# Patient Record
Sex: Female | Born: 1935 | Race: White | Hispanic: No | Smoking: Former smoker
Health system: Southern US, Community
[De-identification: ages and names within clinical notes are randomized; demographics above are authoritative.]

## PROBLEM LIST (undated history)

## (undated) DIAGNOSIS — T510X1A Toxic effect of ethanol, accidental (unintentional), initial encounter: Secondary | ICD-10-CM

## (undated) DIAGNOSIS — I4891 Unspecified atrial fibrillation: Secondary | ICD-10-CM

## (undated) DIAGNOSIS — I428 Other cardiomyopathies: Secondary | ICD-10-CM

## (undated) DIAGNOSIS — H9190 Unspecified hearing loss, unspecified ear: Secondary | ICD-10-CM

## (undated) DIAGNOSIS — E785 Hyperlipidemia, unspecified: Secondary | ICD-10-CM

## (undated) DIAGNOSIS — F1020 Alcohol dependence, uncomplicated: Secondary | ICD-10-CM

## (undated) DIAGNOSIS — I501 Left ventricular failure: Secondary | ICD-10-CM

## (undated) DIAGNOSIS — I10 Essential (primary) hypertension: Secondary | ICD-10-CM

## (undated) DIAGNOSIS — I639 Cerebral infarction, unspecified: Secondary | ICD-10-CM

## (undated) HISTORY — DX: Essential (primary) hypertension: I10

## (undated) HISTORY — PX: ANKLE SURGERY: SHX546

## (undated) HISTORY — DX: Hyperlipidemia, unspecified: E78.5

## (undated) HISTORY — DX: Unspecified atrial fibrillation: I48.91

## (undated) HISTORY — PX: TONSILLECTOMY: SUR1361

## (undated) HISTORY — DX: Cerebral infarction, unspecified: I63.9

## (undated) HISTORY — DX: Other cardiomyopathies: I42.8

## (undated) HISTORY — DX: Toxic effect of ethanol, accidental (unintentional), initial encounter: T51.0X1A

## (undated) HISTORY — DX: Unspecified hearing loss, unspecified ear: H91.90

## (undated) HISTORY — DX: Left ventricular failure, unspecified: I50.1

---
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
## 1997-07-18 ENCOUNTER — Ambulatory Visit (HOSPITAL_COMMUNITY): Admission: RE | Admit: 1997-07-18 | Discharge: 1997-07-18 | Payer: Self-pay | Admitting: Obstetrics and Gynecology

## 1997-08-04 ENCOUNTER — Ambulatory Visit (HOSPITAL_COMMUNITY): Admission: RE | Admit: 1997-08-04 | Discharge: 1997-08-04 | Payer: Self-pay | Admitting: Obstetrics and Gynecology

## 1998-02-13 ENCOUNTER — Other Ambulatory Visit: Admission: RE | Admit: 1998-02-13 | Discharge: 1998-02-13 | Payer: Self-pay | Admitting: Obstetrics and Gynecology

## 1998-10-23 ENCOUNTER — Encounter: Payer: Self-pay | Admitting: Emergency Medicine

## 1998-10-23 ENCOUNTER — Emergency Department (HOSPITAL_COMMUNITY): Admission: EM | Admit: 1998-10-23 | Discharge: 1998-10-23 | Payer: Self-pay | Admitting: Emergency Medicine

## 1998-12-13 ENCOUNTER — Encounter: Admission: RE | Admit: 1998-12-13 | Discharge: 1998-12-25 | Payer: Self-pay | Admitting: Orthopedic Surgery

## 1998-12-26 ENCOUNTER — Encounter: Admission: RE | Admit: 1998-12-26 | Discharge: 1999-01-18 | Payer: Self-pay | Admitting: Orthopedic Surgery

## 1999-04-12 ENCOUNTER — Other Ambulatory Visit: Admission: RE | Admit: 1999-04-12 | Discharge: 1999-04-12 | Payer: Self-pay | Admitting: Obstetrics and Gynecology

## 2000-04-30 ENCOUNTER — Other Ambulatory Visit: Admission: RE | Admit: 2000-04-30 | Discharge: 2000-04-30 | Payer: Self-pay | Admitting: Obstetrics and Gynecology

## 2000-06-01 ENCOUNTER — Encounter: Payer: Self-pay | Admitting: Emergency Medicine

## 2000-06-01 ENCOUNTER — Inpatient Hospital Stay (HOSPITAL_COMMUNITY): Admission: EM | Admit: 2000-06-01 | Discharge: 2000-06-04 | Payer: Self-pay | Admitting: Emergency Medicine

## 2000-06-01 ENCOUNTER — Encounter: Payer: Self-pay | Admitting: Specialist

## 2000-08-11 ENCOUNTER — Encounter: Admission: RE | Admit: 2000-08-11 | Discharge: 2000-09-12 | Payer: Self-pay | Admitting: Specialist

## 2001-05-05 ENCOUNTER — Other Ambulatory Visit: Admission: RE | Admit: 2001-05-05 | Discharge: 2001-05-05 | Payer: Self-pay | Admitting: Obstetrics and Gynecology

## 2004-08-08 ENCOUNTER — Other Ambulatory Visit: Admission: RE | Admit: 2004-08-08 | Discharge: 2004-08-08 | Payer: Self-pay | Admitting: *Deleted

## 2004-12-06 ENCOUNTER — Encounter: Admission: RE | Admit: 2004-12-06 | Discharge: 2004-12-06 | Payer: Self-pay | Admitting: Endocrinology

## 2011-06-03 DIAGNOSIS — H3530 Unspecified macular degeneration: Secondary | ICD-10-CM | POA: Diagnosis not present

## 2011-06-03 DIAGNOSIS — H35369 Drusen (degenerative) of macula, unspecified eye: Secondary | ICD-10-CM | POA: Diagnosis not present

## 2011-07-10 DIAGNOSIS — E785 Hyperlipidemia, unspecified: Secondary | ICD-10-CM | POA: Diagnosis not present

## 2011-07-10 DIAGNOSIS — I10 Essential (primary) hypertension: Secondary | ICD-10-CM | POA: Diagnosis not present

## 2011-07-10 DIAGNOSIS — I501 Left ventricular failure: Secondary | ICD-10-CM | POA: Diagnosis not present

## 2011-08-13 DIAGNOSIS — E785 Hyperlipidemia, unspecified: Secondary | ICD-10-CM | POA: Diagnosis not present

## 2011-08-13 DIAGNOSIS — E559 Vitamin D deficiency, unspecified: Secondary | ICD-10-CM | POA: Diagnosis not present

## 2011-08-13 DIAGNOSIS — R7301 Impaired fasting glucose: Secondary | ICD-10-CM | POA: Diagnosis not present

## 2011-08-13 DIAGNOSIS — I509 Heart failure, unspecified: Secondary | ICD-10-CM | POA: Diagnosis not present

## 2011-09-18 DIAGNOSIS — H43819 Vitreous degeneration, unspecified eye: Secondary | ICD-10-CM | POA: Diagnosis not present

## 2011-11-22 DIAGNOSIS — M545 Low back pain, unspecified: Secondary | ICD-10-CM | POA: Diagnosis not present

## 2011-12-16 DIAGNOSIS — M545 Low back pain, unspecified: Secondary | ICD-10-CM | POA: Diagnosis not present

## 2012-02-17 DIAGNOSIS — E559 Vitamin D deficiency, unspecified: Secondary | ICD-10-CM | POA: Diagnosis not present

## 2012-02-17 DIAGNOSIS — R7301 Impaired fasting glucose: Secondary | ICD-10-CM | POA: Diagnosis not present

## 2012-02-17 DIAGNOSIS — Z23 Encounter for immunization: Secondary | ICD-10-CM | POA: Diagnosis not present

## 2012-02-17 DIAGNOSIS — M5430 Sciatica, unspecified side: Secondary | ICD-10-CM | POA: Diagnosis not present

## 2012-03-31 DIAGNOSIS — M545 Low back pain, unspecified: Secondary | ICD-10-CM | POA: Diagnosis not present

## 2012-04-08 DIAGNOSIS — M545 Low back pain, unspecified: Secondary | ICD-10-CM | POA: Diagnosis not present

## 2012-04-10 DIAGNOSIS — R0789 Other chest pain: Secondary | ICD-10-CM | POA: Diagnosis not present

## 2012-04-10 DIAGNOSIS — I509 Heart failure, unspecified: Secondary | ICD-10-CM | POA: Diagnosis not present

## 2012-04-10 DIAGNOSIS — I10 Essential (primary) hypertension: Secondary | ICD-10-CM | POA: Diagnosis not present

## 2012-04-14 DIAGNOSIS — M545 Low back pain, unspecified: Secondary | ICD-10-CM | POA: Diagnosis not present

## 2012-04-17 DIAGNOSIS — M545 Low back pain, unspecified: Secondary | ICD-10-CM | POA: Diagnosis not present

## 2012-04-21 DIAGNOSIS — M545 Low back pain, unspecified: Secondary | ICD-10-CM | POA: Diagnosis not present

## 2012-05-12 DIAGNOSIS — M545 Low back pain, unspecified: Secondary | ICD-10-CM | POA: Diagnosis not present

## 2012-06-03 DIAGNOSIS — H3530 Unspecified macular degeneration: Secondary | ICD-10-CM | POA: Diagnosis not present

## 2012-06-03 DIAGNOSIS — H2640 Unspecified secondary cataract: Secondary | ICD-10-CM | POA: Diagnosis not present

## 2012-06-03 DIAGNOSIS — H52209 Unspecified astigmatism, unspecified eye: Secondary | ICD-10-CM | POA: Diagnosis not present

## 2012-06-19 DIAGNOSIS — M25519 Pain in unspecified shoulder: Secondary | ICD-10-CM | POA: Diagnosis not present

## 2012-08-04 DIAGNOSIS — E785 Hyperlipidemia, unspecified: Secondary | ICD-10-CM | POA: Diagnosis not present

## 2012-08-04 DIAGNOSIS — I501 Left ventricular failure: Secondary | ICD-10-CM | POA: Diagnosis not present

## 2012-08-04 DIAGNOSIS — I10 Essential (primary) hypertension: Secondary | ICD-10-CM | POA: Diagnosis not present

## 2012-08-17 DIAGNOSIS — I2510 Atherosclerotic heart disease of native coronary artery without angina pectoris: Secondary | ICD-10-CM | POA: Diagnosis not present

## 2012-08-17 DIAGNOSIS — E559 Vitamin D deficiency, unspecified: Secondary | ICD-10-CM | POA: Diagnosis not present

## 2012-08-17 DIAGNOSIS — R7301 Impaired fasting glucose: Secondary | ICD-10-CM | POA: Diagnosis not present

## 2012-08-17 DIAGNOSIS — F341 Dysthymic disorder: Secondary | ICD-10-CM | POA: Diagnosis not present

## 2012-08-17 DIAGNOSIS — M899 Disorder of bone, unspecified: Secondary | ICD-10-CM | POA: Diagnosis not present

## 2012-08-17 DIAGNOSIS — I10 Essential (primary) hypertension: Secondary | ICD-10-CM | POA: Diagnosis not present

## 2012-08-17 DIAGNOSIS — M810 Age-related osteoporosis without current pathological fracture: Secondary | ICD-10-CM | POA: Diagnosis not present

## 2012-08-17 DIAGNOSIS — E785 Hyperlipidemia, unspecified: Secondary | ICD-10-CM | POA: Diagnosis not present

## 2012-09-29 DIAGNOSIS — S46819A Strain of other muscles, fascia and tendons at shoulder and upper arm level, unspecified arm, initial encounter: Secondary | ICD-10-CM | POA: Diagnosis not present

## 2012-09-29 DIAGNOSIS — M25519 Pain in unspecified shoulder: Secondary | ICD-10-CM | POA: Diagnosis not present

## 2012-09-29 DIAGNOSIS — S43499A Other sprain of unspecified shoulder joint, initial encounter: Secondary | ICD-10-CM | POA: Diagnosis not present

## 2012-12-17 DIAGNOSIS — H2640 Unspecified secondary cataract: Secondary | ICD-10-CM | POA: Diagnosis not present

## 2012-12-17 DIAGNOSIS — H26499 Other secondary cataract, unspecified eye: Secondary | ICD-10-CM | POA: Diagnosis not present

## 2012-12-30 DIAGNOSIS — L821 Other seborrheic keratosis: Secondary | ICD-10-CM | POA: Diagnosis not present

## 2012-12-30 DIAGNOSIS — L739 Follicular disorder, unspecified: Secondary | ICD-10-CM | POA: Diagnosis not present

## 2012-12-31 DIAGNOSIS — H26499 Other secondary cataract, unspecified eye: Secondary | ICD-10-CM | POA: Diagnosis not present

## 2012-12-31 DIAGNOSIS — H2640 Unspecified secondary cataract: Secondary | ICD-10-CM | POA: Diagnosis not present

## 2013-01-28 DIAGNOSIS — H3530 Unspecified macular degeneration: Secondary | ICD-10-CM | POA: Diagnosis not present

## 2013-02-22 DIAGNOSIS — M810 Age-related osteoporosis without current pathological fracture: Secondary | ICD-10-CM | POA: Diagnosis not present

## 2013-02-22 DIAGNOSIS — Z23 Encounter for immunization: Secondary | ICD-10-CM | POA: Diagnosis not present

## 2013-02-22 DIAGNOSIS — E785 Hyperlipidemia, unspecified: Secondary | ICD-10-CM | POA: Diagnosis not present

## 2013-02-22 DIAGNOSIS — M899 Disorder of bone, unspecified: Secondary | ICD-10-CM | POA: Diagnosis not present

## 2013-02-22 DIAGNOSIS — R42 Dizziness and giddiness: Secondary | ICD-10-CM | POA: Diagnosis not present

## 2013-02-22 DIAGNOSIS — R7301 Impaired fasting glucose: Secondary | ICD-10-CM | POA: Diagnosis not present

## 2013-02-22 DIAGNOSIS — I10 Essential (primary) hypertension: Secondary | ICD-10-CM | POA: Diagnosis not present

## 2013-02-22 DIAGNOSIS — I509 Heart failure, unspecified: Secondary | ICD-10-CM | POA: Diagnosis not present

## 2013-04-16 ENCOUNTER — Encounter: Payer: Self-pay | Admitting: Interventional Cardiology

## 2013-06-20 ENCOUNTER — Emergency Department (HOSPITAL_COMMUNITY): Payer: Medicare Other

## 2013-06-20 ENCOUNTER — Emergency Department (HOSPITAL_COMMUNITY)
Admission: EM | Admit: 2013-06-20 | Discharge: 2013-06-20 | Disposition: A | Discharge status: Home / Self Care | Payer: Medicare Other | Attending: Emergency Medicine | Admitting: Emergency Medicine

## 2013-06-20 ENCOUNTER — Encounter (HOSPITAL_COMMUNITY): Payer: Self-pay | Admitting: Emergency Medicine

## 2013-06-20 DIAGNOSIS — Y9301 Activity, walking, marching and hiking: Secondary | ICD-10-CM | POA: Insufficient documentation

## 2013-06-20 DIAGNOSIS — S99929A Unspecified injury of unspecified foot, initial encounter: Secondary | ICD-10-CM

## 2013-06-20 DIAGNOSIS — Z9889 Other specified postprocedural states: Secondary | ICD-10-CM | POA: Diagnosis not present

## 2013-06-20 DIAGNOSIS — M79609 Pain in unspecified limb: Secondary | ICD-10-CM | POA: Diagnosis not present

## 2013-06-20 DIAGNOSIS — S99919A Unspecified injury of unspecified ankle, initial encounter: Principal | ICD-10-CM

## 2013-06-20 DIAGNOSIS — Z7982 Long term (current) use of aspirin: Secondary | ICD-10-CM | POA: Diagnosis not present

## 2013-06-20 DIAGNOSIS — S8990XA Unspecified injury of unspecified lower leg, initial encounter: Secondary | ICD-10-CM | POA: Diagnosis not present

## 2013-06-20 DIAGNOSIS — X500XXA Overexertion from strenuous movement or load, initial encounter: Secondary | ICD-10-CM | POA: Insufficient documentation

## 2013-06-20 DIAGNOSIS — Z79899 Other long term (current) drug therapy: Secondary | ICD-10-CM | POA: Diagnosis not present

## 2013-06-20 DIAGNOSIS — R296 Repeated falls: Secondary | ICD-10-CM | POA: Insufficient documentation

## 2013-06-20 DIAGNOSIS — Y929 Unspecified place or not applicable: Secondary | ICD-10-CM | POA: Insufficient documentation

## 2013-06-20 MED ORDER — HYDROCODONE-ACETAMINOPHEN 5-325 MG PO TABS: 1.0000 | ORAL_TABLET | Freq: Three times a day (TID) | ORAL | Status: DC | PRN

## 2013-06-20 NOTE — ED Provider Notes (Signed)
CSN: [id number]     Arrival date & time 06/20/13  1019 History   First MD Initiated Contact with Patient 06/20/13 1031     Chief Complaint  Patient presents with  . Fall  . Foot Pain   (Consider location/radiation/quality/duration/timing/severity/associated sxs/prior Treatment) HPI  Patient to the ER with complaints of right foot pain. She was walking up the stairs when she tripped going up and twisted the foot. She describes herself and a "klutz" and says that she had surgery on this ankle 12 years ago for stepping in a whole and has metal in her foot now. She comes in a post-op boot which helps her walk. The incident happened yesterday evening and the pain was worse last night when she went to bed. It is better but not gone this morning. She took 2 aspirins but she says it did not touch her pain. She denies hitting her head, injuring her neck, no loc, no other injuries, not on blood thinners. Otherwise feels great.  History reviewed. No pertinent past medical history. No past surgical history on file. No family history on file. History  Substance Use Topics  . Smoking status: Never Smoker   . Smokeless tobacco: Not on file  . Alcohol Use: No   OB History   Grav Para Term Preterm Abortions TAB SAB Ect Mult Living                 Review of Systems The patient denies anorexia, fever, weight loss,, vision loss, decreased hearing, hoarseness, chest pain, syncope, dyspnea on exertion, peripheral edema, balance deficits, hemoptysis, abdominal pain, melena, hematochezia, severe indigestion/heartburn, hematuria, incontinence, genital sores, muscle weakness, suspicious skin lesions, transient blindness, difficulty walking, depression, unusual weight change, abnormal bleeding, enlarged lymph nodes, angioedema, and breast masses.  Allergies  Demerol  Home Medications   Current Outpatient Rx  Name  Route  Sig  Dispense  Refill  . ADVAIR DISKUS 250-50 MCG/DOSE AEPB   Inhalation   Inhale 1  puff into the lungs as needed (short of breath).          Marland Kitchen aspirin 325 MG tablet   Oral   Take 325 mg by mouth daily.         Marland Kitchen aspirin EC 81 MG tablet   Oral   Take 81 mg by mouth daily.         Marland Kitchen atorvastatin (LIPITOR) 40 MG tablet   Oral   Take 1 tablet by mouth daily.         . carvedilol (COREG) 6.25 MG tablet   Oral   Take 1 tablet by mouth 2 (two) times daily.         . DULoxetine (CYMBALTA) 30 MG capsule   Oral   Take 1 capsule by mouth 2 (two) times daily.         Marland Kitchen losartan (COZAAR) 100 MG tablet   Oral   Take 1 tablet by mouth 2 (two) times daily.         Marland Kitchen oseltamivir (TAMIFLU) 75 MG capsule   Oral   Take 75 mg by mouth 2 (two) times daily.         Marland Kitchen HYDROcodone-acetaminophen (NORCO/VICODIN) 5-325 MG per tablet   Oral   Take 1 tablet by mouth every 8 (eight) hours as needed.   6 tablet   0    BP 153/70  Pulse 80  Temp(Src) 98.2 F (36.8 C) (Oral)  Resp 14  SpO2 91% Physical Exam  Nursing note and vitals reviewed. Constitutional: She appears well-developed and well-nourished. No distress.  HENT:  Head: Normocephalic and atraumatic.  Eyes: Pupils are equal, round, and reactive to light.  Neck: Normal range of motion. Neck supple.  Cardiovascular: Normal rate and regular rhythm.   Pulmonary/Chest: Effort normal.  Abdominal: Soft.  Musculoskeletal:       Right foot: She exhibits tenderness and bony tenderness. She exhibits normal range of motion, no swelling, normal capillary refill, no crepitus, no deformity and no laceration.       Feet:  neurovascularly intact  Neurological: She is alert.  Skin: Skin is warm and dry.    ED Course  Procedures (including critical care time) Labs Review Labs Reviewed - No data to display Imaging Review Dg Foot Complete Right  06/20/2013   CLINICAL DATA:  Fall, lateral right foot pain. Ankle fracture fixation 12 years ago  EXAM: RIGHT FOOT COMPLETE - 3+ VIEW  COMPARISON:  Report of prior non  digitized exam 06/01/2000  FINDINGS: There is diffuse mild soft tissue edema present. The bones appear mildly a mottled which may be seen with diffuse osteopenia. Fixation hardware is partly noted at the medial and lateral malleoli, respectively. No acute fracture or dislocation identified. Mild lateral subluxation of the distal phalanx of the fifth toe is noted.  IMPRESSION: Mild disuse osteopenia, no acute fracture or dislocation identified.   Electronically Signed   By: Conchita Paris M.D.   On: 06/20/2013 11:02    EKG Interpretation   None       MDM   1. Foot injury    Xray shows no acute abnormalities. No fracture  Dr. Doy Mince saw patient as well. Will give patient a few (#6) Vicodin tabs for home for pain.  80 y.o.Jennifer Rocha's evaluation in the Emergency Department is complete. It has been determined that no acute conditions requiring further emergency intervention are present at this time. The patient/guardian have been advised of the diagnosis and plan. We have discussed signs and symptoms that warrant return to the ED, such as changes or worsening in symptoms.  Vital signs are stable at discharge. Filed Vitals:   06/20/13 1057  BP: 153/70  Pulse: 80  Temp: 98.2 F (36.8 C)  Resp: 14    Patient/guardian has voiced understanding and agreed to follow-up with the PCP or specialist.    Linus Mako, PA-C 06/20/13 1152

## 2013-06-20 NOTE — Discharge Instructions (Signed)
Foot Sprain The muscles and cord like structures which attach muscle to bone (tendons) that surround the feet are made up of units. A foot sprain can occur at the weakest spot in any of these units. This condition is most often caused by injury to or overuse of the foot, as from playing contact sports, or aggravating a previous injury, or from poor conditioning, or obesity. SYMPTOMS  Pain with movement of the foot.  Tenderness and swelling at the injury site.  Loss of strength is present in moderate or severe sprains. THE THREE GRADES OR SEVERITY OF FOOT SPRAIN ARE:  Mild (Grade I): Slightly pulled muscle without tearing of muscle or tendon fibers or loss of strength.  Moderate (Grade II): Tearing of fibers in a muscle, tendon, or at the attachment to bone, with small decrease in strength.  Severe (Grade III): Rupture of the muscle-tendon-bone attachment, with separation of fibers. Severe sprain requires surgical repair. Often repeating (chronic) sprains are caused by overuse. Sudden (acute) sprains are caused by direct injury or over-use. DIAGNOSIS  Diagnosis of this condition is usually by your own observation. If problems continue, a caregiver may be required for further evaluation and treatment. X-rays may be required to make sure there are not breaks in the bones (fractures) present. Continued problems may require physical therapy for treatment. PREVENTION  Use strength and conditioning exercises appropriate for your sport.  Warm up properly prior to working out.  Use athletic shoes that are made for the sport you are participating in.  Allow adequate time for healing. Early return to activities makes repeat injury more likely, and can lead to an unstable arthritic foot that can result in prolonged disability. Mild sprains generally heal in 3 to 10 days, with moderate and severe sprains taking 2 to 10 weeks. Your caregiver can help you determine the proper time required for  healing. HOME CARE INSTRUCTIONS   Apply ice to the injury for 15-20 minutes, 03-04 times per day. Put the ice in a plastic bag and place a towel between the bag of ice and your skin.  An elastic wrap (like an Ace bandage) may be used to keep swelling down.  Keep foot above the level of the heart, or at least raised on a footstool, when swelling and pain are present.  Try to avoid use other than gentle range of motion while the foot is painful. Do not resume use until instructed by your caregiver. Then begin use gradually, not increasing use to the point of pain. If pain does develop, decrease use and continue the above measures, gradually increasing activities that do not cause discomfort, until you gradually achieve normal use.  Use crutches if and as instructed, and for the length of time instructed.  Keep injured foot and ankle wrapped between treatments.  Massage foot and ankle for comfort and to keep swelling down. Massage from the toes up towards the knee.  Only take over-the-counter or prescription medicines for pain, discomfort, or fever as directed by your caregiver. SEEK IMMEDIATE MEDICAL CARE IF:   Your pain and swelling increase, or pain is not controlled with medications.  You have loss of feeling in your foot or your foot turns cold or blue.  You develop new, unexplained symptoms, or an increase of the symptoms that brought you to your caregiver. MAKE SURE YOU:   Understand these instructions.  Will watch your condition.  Will get help right away if you are not doing well or get worse. Document Released:   11/02/2001 Document Revised: 08/05/2011 Document Reviewed: 12/31/2007 [employer] Patient Information 2014 [employer].  

## 2013-06-20 NOTE — ED Notes (Signed)
Pt states that she fell going up the stairs yesterday and is now c/o pain in rt foot.

## 2013-06-21 NOTE — ED Provider Notes (Signed)
Medical screening examination/treatment/procedure(s) were conducted as a shared visit with non-physician practitioner(s) and myself.  I personally evaluated the patient during the encounter.  EKG Interpretation   None       79 yo female who fell while walking upstairs yesterday, injury her right foot.  She denied any other injuries, head trauma, or neck trauma/pain.  On exam, well appearing, head atraumatic, neck nontender with full ROM, normal respiratory effort, good perfusion, right foot with moderate ecchymosis on forefoot and lateral aspect, good motor function, good sensation, 2+ DP pulses.  Plain films negative.  Plan outpatient management.   Clinical Impression: 1. Foot injury       Houston Siren, MD 06/21/13 2322

## 2013-06-21 NOTE — ED Provider Notes (Signed)
Medical screening examination/treatment/procedure(s) were conducted as a shared visit with non-physician practitioner(s) and myself.  I personally evaluated the patient during the encounter.  EKG Interpretation   None         Houston Siren, MD 06/21/13 2322

## 2013-07-28 DIAGNOSIS — H52209 Unspecified astigmatism, unspecified eye: Secondary | ICD-10-CM | POA: Diagnosis not present

## 2013-07-28 DIAGNOSIS — Z961 Presence of intraocular lens: Secondary | ICD-10-CM | POA: Diagnosis not present

## 2013-07-28 DIAGNOSIS — H3530 Unspecified macular degeneration: Secondary | ICD-10-CM | POA: Diagnosis not present

## 2013-07-29 ENCOUNTER — Ambulatory Visit: Payer: Self-pay | Admitting: Interventional Cardiology

## 2013-08-03 ENCOUNTER — Ambulatory Visit: Payer: Self-pay | Admitting: Interventional Cardiology

## 2013-08-04 DIAGNOSIS — L03039 Cellulitis of unspecified toe: Secondary | ICD-10-CM | POA: Diagnosis not present

## 2013-08-04 DIAGNOSIS — M2060 Acquired deformities of toe(s), unspecified, unspecified foot: Secondary | ICD-10-CM | POA: Diagnosis not present

## 2013-08-11 DIAGNOSIS — M2060 Acquired deformities of toe(s), unspecified, unspecified foot: Secondary | ICD-10-CM | POA: Diagnosis not present

## 2013-08-21 ENCOUNTER — Encounter: Payer: Self-pay | Admitting: *Deleted

## 2013-08-22 ENCOUNTER — Encounter: Payer: Self-pay | Admitting: *Deleted

## 2013-08-22 DIAGNOSIS — T510X1A Toxic effect of ethanol, accidental (unintentional), initial encounter: Secondary | ICD-10-CM | POA: Insufficient documentation

## 2013-08-22 DIAGNOSIS — I10 Essential (primary) hypertension: Secondary | ICD-10-CM | POA: Insufficient documentation

## 2013-08-22 DIAGNOSIS — I501 Left ventricular failure: Secondary | ICD-10-CM | POA: Insufficient documentation

## 2013-08-22 DIAGNOSIS — I428 Other cardiomyopathies: Secondary | ICD-10-CM | POA: Insufficient documentation

## 2013-08-23 ENCOUNTER — Ambulatory Visit (INDEPENDENT_AMBULATORY_CARE_PROVIDER_SITE_OTHER): Payer: Medicare Other | Admitting: Interventional Cardiology

## 2013-08-23 ENCOUNTER — Encounter: Payer: Self-pay | Admitting: Interventional Cardiology

## 2013-08-23 VITALS — BP 140/74 | HR 92 | Ht 63.0 in | Wt 153.0 lb

## 2013-08-23 DIAGNOSIS — I5032 Chronic diastolic (congestive) heart failure: Secondary | ICD-10-CM

## 2013-08-23 DIAGNOSIS — I10 Essential (primary) hypertension: Secondary | ICD-10-CM | POA: Diagnosis not present

## 2013-08-23 DIAGNOSIS — I428 Other cardiomyopathies: Secondary | ICD-10-CM | POA: Diagnosis not present

## 2013-08-23 DIAGNOSIS — I5042 Chronic combined systolic (congestive) and diastolic (congestive) heart failure: Secondary | ICD-10-CM | POA: Insufficient documentation

## 2013-08-23 MED ORDER — LOSARTAN POTASSIUM 50 MG PO TABS: 100.0000 mg | ORAL_TABLET | Freq: Two times a day (BID) | ORAL | Status: DC

## 2013-08-23 MED ORDER — ASPIRIN EC 81 MG PO TBEC: 81.0000 mg | DELAYED_RELEASE_TABLET | Freq: Every day | ORAL | Status: DC

## 2013-08-23 NOTE — Patient Instructions (Signed)
Your physician recommends that you continue on your current medications as directed. Please refer to the Current Medication list given to you today.  Your physician wants you to follow-up in: 1 year. You will receive a reminder letter in the mail two months in advance. If you don't receive a letter, please call our office to schedule the follow-up appointment.  

## 2013-08-23 NOTE — Progress Notes (Signed)
Patient ID: Jennifer Rocha, female   DOB: 10/03/1927, 80 y.o.   MRN: [id number]    [address], [place]  [address] Phone: [phone] Fax:  [phone]  Date:  08/23/2013   ID:  Jennifer Rocha, DOB Aug 23, 1927, MRN [id number]  PCP:  No primary provider on file.   ASSESSMENT:  1. Systolic heart failure returning to normal LV function and 2009 and currently asymptomatic on low-dose heart failure therapy as outlined below 2. Ethanol consumption as ceased 3. Hypertension is controlled  PLAN:  1. No change in current medical regimen; would continue ARB and beta blocker therapy indefinitely 2. Clinical followup in one year   SUBJECTIVE: Jennifer Rocha is a 80 y.o. female denies dyspnea, palpitations, syncope, edema, and chest pain. There are no medication side effects. She denies transient neurological complaints. She denies ethanol consumption.   Wt Readings from Last 3 Encounters:  08/23/13 153 lb (69.4 kg)     Past Medical History  Diagnosis Date  . Hypertension   . Non-ischemic cardiomyopathy     with EF 20% improving to 50%  . Ethanol causing toxic effect   . Left heart failure     Current Outpatient Prescriptions  Medication Sig Dispense Refill  . aspirin 325 MG tablet Take 325 mg by mouth daily.      Marland Kitchen aspirin EC 81 MG tablet Take 81 mg by mouth daily.      Marland Kitchen atorvastatin (LIPITOR) 40 MG tablet Take 1 tablet by mouth daily.      . carvedilol (COREG) 6.25 MG tablet Take 1 tablet by mouth 2 (two) times daily.      . DULoxetine (CYMBALTA) 30 MG capsule Take 1 capsule by mouth 2 (two) times daily.      Marland Kitchen HYDROcodone-acetaminophen (NORCO/VICODIN) 5-325 MG per tablet Take 1 tablet by mouth every 8 (eight) hours as needed.  6 tablet  0  . losartan (COZAAR) 100 MG tablet Take 1 tablet by mouth 2 (two) times daily.       No current facility-administered medications for this visit.    Allergies:    Allergies  Allergen Reactions  . Demerol  [Meperidine] Nausea And Vomiting  . Lisinopril     Social History:  The patient  reports that she has never smoked. She does not have any smokeless tobacco history on file. She reports that she does not drink alcohol or use illicit drugs.   ROS:  Please see the history of present illness.   Appetite and weight are stable   All other systems reviewed and negative.   OBJECTIVE: VS:  BP 140/74  Pulse 92  Ht 5\' 3"  (1.6 m)  Wt 153 lb (69.4 kg)  BMI 27.11 kg/m2 Well nourished, well developed, in no acute distress, elderly HEENT: normal Neck: JVD flat. Carotid bruit absent  Cardiac:  normal S1, S2; RRR; no murmur Lungs:  clear to auscultation bilaterally, no wheezing, rhonchi or rales Abd: soft, nontender, no hepatomegaly Ext: Edema absent. Pulses 2+ Skin: warm and dry Neuro:  CNs 2-12 intact, no focal abnormalities noted  EKG:  Normal       Signed, Illene Labrador III, MD 08/23/2013 2:08 PM

## 2013-08-31 DIAGNOSIS — I10 Essential (primary) hypertension: Secondary | ICD-10-CM | POA: Diagnosis not present

## 2013-08-31 DIAGNOSIS — Z1331 Encounter for screening for depression: Secondary | ICD-10-CM | POA: Diagnosis not present

## 2013-08-31 DIAGNOSIS — E559 Vitamin D deficiency, unspecified: Secondary | ICD-10-CM | POA: Diagnosis not present

## 2013-08-31 DIAGNOSIS — I2510 Atherosclerotic heart disease of native coronary artery without angina pectoris: Secondary | ICD-10-CM | POA: Diagnosis not present

## 2013-08-31 DIAGNOSIS — I509 Heart failure, unspecified: Secondary | ICD-10-CM | POA: Diagnosis not present

## 2013-08-31 DIAGNOSIS — R112 Nausea with vomiting, unspecified: Secondary | ICD-10-CM | POA: Diagnosis not present

## 2013-08-31 DIAGNOSIS — E785 Hyperlipidemia, unspecified: Secondary | ICD-10-CM | POA: Diagnosis not present

## 2013-08-31 DIAGNOSIS — R7301 Impaired fasting glucose: Secondary | ICD-10-CM | POA: Diagnosis not present

## 2013-09-22 DIAGNOSIS — L02619 Cutaneous abscess of unspecified foot: Secondary | ICD-10-CM | POA: Diagnosis not present

## 2013-09-22 DIAGNOSIS — M79609 Pain in unspecified limb: Secondary | ICD-10-CM | POA: Diagnosis not present

## 2013-09-22 DIAGNOSIS — L03039 Cellulitis of unspecified toe: Secondary | ICD-10-CM | POA: Diagnosis not present

## 2013-10-02 ENCOUNTER — Emergency Department (HOSPITAL_COMMUNITY): Payer: Medicare Other

## 2013-10-02 ENCOUNTER — Encounter (HOSPITAL_COMMUNITY): Payer: Self-pay | Admitting: Emergency Medicine

## 2013-10-02 ENCOUNTER — Emergency Department (HOSPITAL_COMMUNITY)
Admission: EM | Admit: 2013-10-02 | Discharge: 2013-10-02 | Disposition: A | Discharge status: Home / Self Care | Payer: Medicare Other | Attending: Emergency Medicine | Admitting: Emergency Medicine

## 2013-10-02 DIAGNOSIS — I10 Essential (primary) hypertension: Secondary | ICD-10-CM | POA: Insufficient documentation

## 2013-10-02 DIAGNOSIS — M25519 Pain in unspecified shoulder: Secondary | ICD-10-CM

## 2013-10-02 DIAGNOSIS — W19XXXA Unspecified fall, initial encounter: Secondary | ICD-10-CM

## 2013-10-02 DIAGNOSIS — Y9289 Other specified places as the place of occurrence of the external cause: Secondary | ICD-10-CM | POA: Insufficient documentation

## 2013-10-02 DIAGNOSIS — S4980XA Other specified injuries of shoulder and upper arm, unspecified arm, initial encounter: Secondary | ICD-10-CM | POA: Insufficient documentation

## 2013-10-02 DIAGNOSIS — IMO0002 Reserved for concepts with insufficient information to code with codable children: Secondary | ICD-10-CM | POA: Diagnosis not present

## 2013-10-02 DIAGNOSIS — M549 Dorsalgia, unspecified: Secondary | ICD-10-CM | POA: Diagnosis not present

## 2013-10-02 DIAGNOSIS — Z7982 Long term (current) use of aspirin: Secondary | ICD-10-CM | POA: Insufficient documentation

## 2013-10-02 DIAGNOSIS — R296 Repeated falls: Secondary | ICD-10-CM | POA: Insufficient documentation

## 2013-10-02 DIAGNOSIS — M546 Pain in thoracic spine: Secondary | ICD-10-CM | POA: Diagnosis not present

## 2013-10-02 DIAGNOSIS — I501 Left ventricular failure: Secondary | ICD-10-CM | POA: Insufficient documentation

## 2013-10-02 DIAGNOSIS — Z79899 Other long term (current) drug therapy: Secondary | ICD-10-CM | POA: Diagnosis not present

## 2013-10-02 DIAGNOSIS — Y9301 Activity, walking, marching and hiking: Secondary | ICD-10-CM | POA: Insufficient documentation

## 2013-10-02 DIAGNOSIS — S46909A Unspecified injury of unspecified muscle, fascia and tendon at shoulder and upper arm level, unspecified arm, initial encounter: Secondary | ICD-10-CM | POA: Diagnosis not present

## 2013-10-02 MED ORDER — HYDROCODONE-ACETAMINOPHEN 5-325 MG PO TABS: 1.0000 | ORAL_TABLET | ORAL | Status: DC | PRN

## 2013-10-02 NOTE — Discharge Instructions (Signed)
Take the prescribed medication as directed.  Do not drive while taking this.  Medication may also cause constipation-- may need to add stool softener,. Follow-up with Dr. Forde Dandy if continue having problems in the next few days. Return to the ED for new or worsening symptoms.

## 2013-10-02 NOTE — ED Provider Notes (Signed)
CSN: [id number]     Arrival date & time 10/02/13  1051 History   First MD Initiated Contact with Patient 10/02/13 1108     Chief Complaint  Patient presents with  . Back Pain     (Consider location/radiation/quality/duration/timing/severity/associated sxs/prior Treatment) Patient is a 80 y.o. female presenting with back pain. The history is provided by the patient and medical records.  Back Pain  This is an 80 year old female with past medical history significant for hypertension, congestive heart failure, presenting to the ED for falls. Patient states she has fallen twice in the past week, most recently last night. States she was attempting to walk to the bathroom and put her hand on a chair for stabilization, but the chair moved causing her to fall on her right side onto a carpeted floor. She denies head trauma or loss of consciousness. States now she is having pain to right shoulder and right upper back.  Pain worse with coughing.  Denies any dizziness, lightheadedness, or feelings of syncope prior to fall.  Pt ambulates unassisted at baseline.  Denies numbness/paresthesias, or weakness of extremities.  Past Medical History  Diagnosis Date  . Hypertension   . Non-ischemic cardiomyopathy     with EF 20% improving to 50%  . Ethanol causing toxic effect   . Left heart failure    History reviewed. No pertinent past surgical history. Family History  Problem Relation Age of Onset  . Heart failure Mother    History  Substance Use Topics  . Smoking status: Never Smoker   . Smokeless tobacco: Not on file  . Alcohol Use: No   OB History   Grav Para Term Preterm Abortions TAB SAB Ect Mult Living                 Review of Systems  Musculoskeletal: Positive for arthralgias and back pain.  All other systems reviewed and are negative.     Allergies  Demerol  Home Medications   Prior to Admission medications   Medication Sig Start Date End Date Taking? Authorizing Provider    aspirin EC 81 MG tablet Take 81 mg by mouth daily.   Yes Historical Provider, MD  atorvastatin (LIPITOR) 40 MG tablet Take 1 tablet by mouth daily. 05/28/13  Yes Historical Provider, MD  carvedilol (COREG) 6.25 MG tablet Take 1 tablet by mouth daily.  06/02/13  Yes Historical Provider, MD  DULoxetine (CYMBALTA) 30 MG capsule Take 1 capsule by mouth daily.  05/06/13  Yes Historical Provider, MD  ibuprofen (ADVIL,MOTRIN) 200 MG tablet Take 200 mg by mouth every 6 (six) hours as needed for moderate pain.   Yes Historical Provider, MD  losartan (COZAAR) 50 MG tablet Take 50 mg by mouth daily.  08/23/13  Yes Sinclair Grooms, MD  Multiple Vitamin (MULTIVITAMIN WITH MINERALS) TABS tablet Take 1 tablet by mouth daily.   Yes Historical Provider, MD  vitamin B-12 (CYANOCOBALAMIN) 100 MCG tablet Take 100 mcg by mouth daily.   Yes Historical Provider, MD   BP 149/69  Pulse 72  Temp(Src) 98 F (36.7 C) (Oral)  Resp 15  SpO2 100%  Physical Exam  Nursing note and vitals reviewed. Constitutional: She is oriented to person, place, and time. She appears well-developed and well-nourished. No distress.  HENT:  Head: Normocephalic and atraumatic.  Mouth/Throat: Oropharynx is clear and moist.  No visible signs of head trauma  Eyes: Conjunctivae and EOM are normal. Pupils are equal, round, and reactive to light.  Neck:  Normal range of motion and full passive range of motion without pain. Neck supple.  Cardiovascular: Normal rate, regular rhythm and normal heart sounds.   Pulmonary/Chest: Effort normal and breath sounds normal.  Musculoskeletal: Normal range of motion.       Right shoulder: She exhibits tenderness, bony tenderness and pain. She exhibits normal range of motion.       Cervical back: Normal.       Thoracic back: She exhibits tenderness, bony tenderness and pain.       Back:       Arms: TTP of right lateral and posterior shoulder; full ROM maintained without difficulty; strong radial pulse and  cap refill; sensation intact diffusely throughout arm Thoracic spine with some midline and right paraspinal tenderness; no midline step-off or deformity; full ROM maintained  Neurological: She is alert and oriented to person, place, and time.  AAOx3, answering questions appropriately; equal strength UE and LE bilaterally; CN grossly intact; moves all extremities appropriately without ataxia; no focal neuro deficits or facial asymmetry appreciated  Skin: Skin is warm and dry. She is not diaphoretic.  Psychiatric: She has a normal mood and affect.    ED Course  Procedures (including critical care time) Labs Review Labs Reviewed - No data to display  Imaging Review Dg Thoracic Spine 2 View  10/02/2013   CLINICAL DATA:  [place] last night with right upper back pain in right shoulder pain  EXAM: THORACIC SPINE - 2 VIEW  COMPARISON:  None.  FINDINGS: Extensive osteopenia. Mild scoliosis. Normal anterior-posterior alignment. No paraspinous hematoma. No fracture.  IMPRESSION: No evidence of thoracic spine fracture.   Electronically Signed   By: Skipper Cliche M.D.   On: 10/02/2013 11:59   Dg Shoulder Right  10/02/2013   CLINICAL DATA:  Fall, right shoulder pain  EXAM: RIGHT SHOULDER - 2+ VIEW  COMPARISON:  Concurrently obtained thoracic spine radiographs  FINDINGS: No acute fracture or malalignment. Degenerative osteoarthritis at the acromioclavicular joint. Visualized thorax is unremarkable. Atherosclerotic calcifications noted in the transverse aorta.  IMPRESSION: 1. No acute fracture or malalignment. 2. Right acromioclavicular joint osteoarthritis. 3. Aortic atherosclerosis.   Electronically Signed   By: Jacqulynn Cadet M.D.   On: 10/02/2013 11:51     EKG Interpretation None      MDM   Final diagnoses:  Fall  Back pain  Shoulder pain   80 y.o. F presenting to the ED for right shoulder and upper back pain after 2 mechanical falls.  No episodes of dizziness/lightheadedness/syncope prior to  falls.  Imaging negative for acute fx/subluxations.  Pt has no focal deficits on exam or red flag sx.  Rx vicodin.  She will FU with PCP if continue having pain in the next few days.  Discussed plan with patient, he/she acknowledged understanding and agreed with plan of care.  Return precautions given for new or worsening symptoms.  Larene Pickett, PA-C 10/02/13 1218

## 2013-10-02 NOTE — ED Provider Notes (Signed)
Medical screening examination/treatment/procedure(s) were performed by non-physician practitioner and as supervising physician I was immediately available for consultation/collaboration.   EKG Interpretation None        Blanchard Kelch, MD 10/02/13 1527

## 2013-10-02 NOTE — ED Notes (Signed)
Patient states that she had fallen 2 times this week. The last time was last night when she put her hand on a chair and she knocked herself down. The patinet reports that she is having pain to her right shoulder and back.

## 2013-10-04 DIAGNOSIS — M549 Dorsalgia, unspecified: Secondary | ICD-10-CM | POA: Diagnosis not present

## 2013-10-04 DIAGNOSIS — Z6827 Body mass index (BMI) 27.0-27.9, adult: Secondary | ICD-10-CM | POA: Diagnosis not present

## 2013-12-25 ENCOUNTER — Emergency Department (HOSPITAL_COMMUNITY)
Admission: EM | Admit: 2013-12-25 | Discharge: 2013-12-25 | Disposition: A | Discharge status: Home / Self Care | Payer: Medicare Other | Attending: Emergency Medicine | Admitting: Emergency Medicine

## 2013-12-25 ENCOUNTER — Encounter (HOSPITAL_COMMUNITY): Payer: Self-pay | Admitting: Emergency Medicine

## 2013-12-25 DIAGNOSIS — Y93H2 Activity, gardening and landscaping: Secondary | ICD-10-CM | POA: Insufficient documentation

## 2013-12-25 DIAGNOSIS — Z7982 Long term (current) use of aspirin: Secondary | ICD-10-CM | POA: Diagnosis not present

## 2013-12-25 DIAGNOSIS — I501 Left ventricular failure: Secondary | ICD-10-CM | POA: Insufficient documentation

## 2013-12-25 DIAGNOSIS — S61409A Unspecified open wound of unspecified hand, initial encounter: Secondary | ICD-10-CM | POA: Diagnosis not present

## 2013-12-25 DIAGNOSIS — I10 Essential (primary) hypertension: Secondary | ICD-10-CM | POA: Insufficient documentation

## 2013-12-25 DIAGNOSIS — S01501A Unspecified open wound of lip, initial encounter: Secondary | ICD-10-CM | POA: Insufficient documentation

## 2013-12-25 DIAGNOSIS — Y92009 Unspecified place in unspecified non-institutional (private) residence as the place of occurrence of the external cause: Secondary | ICD-10-CM | POA: Insufficient documentation

## 2013-12-25 DIAGNOSIS — S1093XA Contusion of unspecified part of neck, initial encounter: Secondary | ICD-10-CM

## 2013-12-25 DIAGNOSIS — S0990XA Unspecified injury of head, initial encounter: Secondary | ICD-10-CM | POA: Diagnosis not present

## 2013-12-25 DIAGNOSIS — W010XXA Fall on same level from slipping, tripping and stumbling without subsequent striking against object, initial encounter: Secondary | ICD-10-CM | POA: Insufficient documentation

## 2013-12-25 DIAGNOSIS — S0083XA Contusion of other part of head, initial encounter: Secondary | ICD-10-CM | POA: Insufficient documentation

## 2013-12-25 DIAGNOSIS — S025XXA Fracture of tooth (traumatic), initial encounter for closed fracture: Secondary | ICD-10-CM | POA: Insufficient documentation

## 2013-12-25 DIAGNOSIS — IMO0002 Reserved for concepts with insufficient information to code with codable children: Secondary | ICD-10-CM | POA: Diagnosis not present

## 2013-12-25 DIAGNOSIS — S0003XA Contusion of scalp, initial encounter: Secondary | ICD-10-CM | POA: Diagnosis not present

## 2013-12-25 DIAGNOSIS — T07XXXA Unspecified multiple injuries, initial encounter: Secondary | ICD-10-CM

## 2013-12-25 DIAGNOSIS — Z23 Encounter for immunization: Secondary | ICD-10-CM | POA: Diagnosis not present

## 2013-12-25 DIAGNOSIS — S51809A Unspecified open wound of unspecified forearm, initial encounter: Secondary | ICD-10-CM | POA: Diagnosis not present

## 2013-12-25 DIAGNOSIS — Z79899 Other long term (current) drug therapy: Secondary | ICD-10-CM | POA: Insufficient documentation

## 2013-12-25 DIAGNOSIS — W19XXXA Unspecified fall, initial encounter: Secondary | ICD-10-CM

## 2013-12-25 DIAGNOSIS — S01511A Laceration without foreign body of lip, initial encounter: Secondary | ICD-10-CM

## 2013-12-25 MED ORDER — TETANUS-DIPHTH-ACELL PERTUSSIS 5-2.5-18.5 LF-MCG/0.5 IM SUSP
0.5000 mL | Freq: Once | INTRAMUSCULAR | Status: AC
  Administered 2013-12-25: 0.5 mL via INTRAMUSCULAR
  Filled 2013-12-25: qty 0.5

## 2013-12-25 NOTE — ED Notes (Addendum)
Pt reports to ed with c/o fall and facial laceration. Pt sts she was watering the garden when she slipped and fell hitting her face. She has laceration on right side of her face, under eye, on lips, she has broken front tooth. Also lac noted to left arm, antecubital area. Multiple abrasion noted to her left forearm, left knee with some swelling.

## 2013-12-25 NOTE — ED Provider Notes (Signed)
CSN: [id number]     Arrival date & time 12/25/13  1216 History   First MD Initiated Contact with Patient 12/25/13 1239     Chief Complaint  Patient presents with  . Fall  . Facial Injury     (Consider location/radiation/quality/duration/timing/severity/associated sxs/prior Treatment) HPI Comments: Patient presenting after a fall that occurred just prior to arrival.  She reports that she was outside watering her garden when she tripped over the hose and landed on her face.  She denies LOC.  She denies feeling dizzy or lightheaded prior to the fall.  Denies chest pain or SOB.  She denies any pain at this time.  She reports that a rose bush cut her left arm when she fell.  She sustained a laceration to the left arm and also the lower lip.  She denies any back pain, neck pain, or pain of her extremities.  Denies nausea, vomiting, or vision change.  She has been ambulatory since the fall.  She is currently on 81 mg ASA daily, but denies use of any other anticoagulants.  She is unsure of the date of her last Tetanus.  She currently lives at home by herself.   The history is provided by the patient.    Past Medical History  Diagnosis Date  . Hypertension   . Non-ischemic cardiomyopathy     with EF 20% improving to 50%  . Ethanol causing toxic effect   . Left heart failure    History reviewed. No pertinent past surgical history. Family History  Problem Relation Age of Onset  . Heart failure Mother    History  Substance Use Topics  . Smoking status: Never Smoker   . Smokeless tobacco: Not on file  . Alcohol Use: No   OB History   Grav Para Term Preterm Abortions TAB SAB Ect Mult Living                 Review of Systems  All other systems reviewed and are negative.     Allergies  Demerol  Home Medications   Prior to Admission medications   Medication Sig Start Date End Date Taking? Authorizing Provider  aspirin EC 81 MG tablet Take 81 mg by mouth daily.   Yes Historical  Provider, MD  atorvastatin (LIPITOR) 40 MG tablet Take 1 tablet by mouth daily. 05/28/13  Yes Historical Provider, MD  carvedilol (COREG) 6.25 MG tablet Take 1 tablet by mouth 2 (two) times daily.  06/02/13  Yes Historical Provider, MD  DULoxetine (CYMBALTA) 30 MG capsule Take 1 capsule by mouth daily.  05/06/13  Yes Historical Provider, MD  losartan (COZAAR) 100 MG tablet Take 50 mg by mouth daily.   Yes Historical Provider, MD  Multiple Vitamin (MULTIVITAMIN WITH MINERALS) TABS tablet Take 1 tablet by mouth daily.   Yes Historical Provider, MD  Multiple Vitamins-Minerals (ICAPS MV) TABS Take 1 tablet by mouth at bedtime.   Yes Historical Provider, MD  vitamin B-12 (CYANOCOBALAMIN) 100 MCG tablet Take 100 mcg by mouth daily.   Yes Historical Provider, MD   BP 133/64  Pulse 90  Temp(Src) 97.8 F (36.6 C) (Oral)  Resp 18  SpO2 97% Physical Exam  Nursing note and vitals reviewed. Constitutional: She appears well-developed and well-nourished.  HENT:  Head: Normocephalic.    Mouth/Throat: Oropharynx is clear and moist.  Small 1 cm laceration of the right lower lip Fractured front tooth  Eyes: EOM are normal. Pupils are equal, round, and reactive to light.  Neck: Normal range of motion. Neck supple.  Cardiovascular: Normal rate, regular rhythm and normal heart sounds.   Pulses:      Radial pulses are 2+ on the right side, and 2+ on the left side.       Dorsalis pedis pulses are 2+ on the right side, and 2+ on the left side.  Pulmonary/Chest: Effort normal and breath sounds normal. She exhibits no tenderness.  Abdominal: Soft. She exhibits no distension. There is no tenderness.  Musculoskeletal: Normal range of motion.       Left elbow: She exhibits normal range of motion and no swelling. No tenderness found.       Left knee: She exhibits normal range of motion, no ecchymosis, no erythema and no bony tenderness. No tenderness found.       Cervical back: She exhibits normal range of motion,  no tenderness, no bony tenderness, no swelling, no edema and no deformity.       Thoracic back: She exhibits normal range of motion, no tenderness, no bony tenderness, no swelling, no edema and no deformity.       Lumbar back: She exhibits normal range of motion, no tenderness, no bony tenderness, no swelling, no edema and no deformity.  Full ROM of all extremities without pain Bruising over the dorsal aspect of the right hand.  No tenderness to palpation of the right hand.  Neurological: She is alert. She has normal strength. No cranial nerve deficit or sensory deficit. Gait normal.  Skin: Skin is warm and dry.     Multiple abrasions of the left forearm.    Psychiatric: She has a normal mood and affect.    ED Course  Procedures (including critical care time) Labs Review Labs Reviewed - No data to display  Imaging Review No results found.   EKG Interpretation None     LACERATION REPAIR Performed by: Hyman Bible Authorized by: Hyman Bible Consent: Verbal consent obtained. Risks and benefits: risks, benefits and alternatives were discussed Consent given by: patient Patient identity confirmed: provided demographic data Prepped and Draped in normal sterile fashion Wound explored  Laceration Location: left arm  Laceration Length: 3 cm  No Foreign Bodies seen or palpated  Anesthesia: local infiltration  Local anesthetic: lidocaine 2% with epinephrine  Anesthetic total: 3 ml  Irrigation method: syringe Amount of cleaning: standard  Skin closure: 4-0 Prolene  Number of sutures: 4  Technique: Simple interrupted  Patient tolerance: Patient tolerated the procedure well with no immediate complications.  MDM   Final diagnoses:  None   Patient presenting after a mechanical fall.  She reports landing on her face when she fell.  Denies LOC.  She is on 81 mg ASA, but no other anticoagulants.  VSS.  Patient with normal neurological exam.  Full ROM of all  extremities without pain.  No bony tenderness to palpation.  Ambulating without difficulty.  No spinal tenderness to palpation.  Therefore, do not feel that any imaging is indicated at this time.  Laceration of left arm repaired with sutures.  Laceration was superficial and the extent of the wound visualized and palpated.  Patient neurovascularly intact.  Feel that the patient is stable for discharge.  Return precautions given.  Patient also evaluated by Dr Ashok Cordia who is in agreement with the plan.      Hyman Bible, PA-C 12/26/13 2343

## 2013-12-25 NOTE — ED Notes (Signed)
Provided pt with ice water °

## 2013-12-27 NOTE — ED Provider Notes (Signed)
Medical screening examination/treatment/procedure(s) were conducted as a shared visit with non-physician practitioner(s) and myself.  I personally evaluated the patient during the encounter.  Pt c/o mechanical fall, trip and fall outside falling forward, contusion to face. Spine nt. Denies faintness or dizziness.    Mirna Mires, MD 12/27/13 [phone]

## 2014-01-03 DIAGNOSIS — Z6826 Body mass index (BMI) 26.0-26.9, adult: Secondary | ICD-10-CM | POA: Diagnosis not present

## 2014-01-03 DIAGNOSIS — Z9181 History of falling: Secondary | ICD-10-CM | POA: Diagnosis not present

## 2014-01-03 DIAGNOSIS — Z4802 Encounter for removal of sutures: Secondary | ICD-10-CM | POA: Diagnosis not present

## 2014-01-03 DIAGNOSIS — T148XXA Other injury of unspecified body region, initial encounter: Secondary | ICD-10-CM | POA: Diagnosis not present

## 2014-01-24 DIAGNOSIS — H3530 Unspecified macular degeneration: Secondary | ICD-10-CM | POA: Diagnosis not present

## 2014-01-24 DIAGNOSIS — Z961 Presence of intraocular lens: Secondary | ICD-10-CM | POA: Diagnosis not present

## 2014-01-24 DIAGNOSIS — H35369 Drusen (degenerative) of macula, unspecified eye: Secondary | ICD-10-CM | POA: Diagnosis not present

## 2014-02-09 DIAGNOSIS — L02619 Cutaneous abscess of unspecified foot: Secondary | ICD-10-CM | POA: Diagnosis not present

## 2014-02-09 DIAGNOSIS — L03039 Cellulitis of unspecified toe: Secondary | ICD-10-CM | POA: Diagnosis not present

## 2014-02-25 DIAGNOSIS — L03031 Cellulitis of right toe: Secondary | ICD-10-CM | POA: Diagnosis not present

## 2014-03-08 DIAGNOSIS — L03031 Cellulitis of right toe: Secondary | ICD-10-CM | POA: Diagnosis not present

## 2014-03-15 DIAGNOSIS — M810 Age-related osteoporosis without current pathological fracture: Secondary | ICD-10-CM | POA: Diagnosis not present

## 2014-03-15 DIAGNOSIS — M8580 Other specified disorders of bone density and structure, unspecified site: Secondary | ICD-10-CM | POA: Diagnosis not present

## 2014-03-15 DIAGNOSIS — F418 Other specified anxiety disorders: Secondary | ICD-10-CM | POA: Diagnosis not present

## 2014-03-15 DIAGNOSIS — I2510 Atherosclerotic heart disease of native coronary artery without angina pectoris: Secondary | ICD-10-CM | POA: Diagnosis not present

## 2014-03-15 DIAGNOSIS — Z23 Encounter for immunization: Secondary | ICD-10-CM | POA: Diagnosis not present

## 2014-03-15 DIAGNOSIS — R42 Dizziness and giddiness: Secondary | ICD-10-CM | POA: Diagnosis not present

## 2014-03-15 DIAGNOSIS — I10 Essential (primary) hypertension: Secondary | ICD-10-CM | POA: Diagnosis not present

## 2014-03-15 DIAGNOSIS — E785 Hyperlipidemia, unspecified: Secondary | ICD-10-CM | POA: Diagnosis not present

## 2014-03-15 DIAGNOSIS — R7301 Impaired fasting glucose: Secondary | ICD-10-CM | POA: Diagnosis not present

## 2014-07-27 DIAGNOSIS — H43813 Vitreous degeneration, bilateral: Secondary | ICD-10-CM | POA: Diagnosis not present

## 2014-07-27 DIAGNOSIS — H3531 Nonexudative age-related macular degeneration: Secondary | ICD-10-CM | POA: Diagnosis not present

## 2014-07-27 DIAGNOSIS — H04123 Dry eye syndrome of bilateral lacrimal glands: Secondary | ICD-10-CM | POA: Diagnosis not present

## 2014-07-27 DIAGNOSIS — Z961 Presence of intraocular lens: Secondary | ICD-10-CM | POA: Diagnosis not present

## 2014-08-16 ENCOUNTER — Encounter: Payer: Self-pay | Admitting: Interventional Cardiology

## 2014-08-16 ENCOUNTER — Ambulatory Visit (INDEPENDENT_AMBULATORY_CARE_PROVIDER_SITE_OTHER): Payer: Medicare Other | Admitting: Interventional Cardiology

## 2014-08-16 VITALS — BP 120/64 | HR 75 | Ht 63.0 in | Wt 145.0 lb

## 2014-08-16 DIAGNOSIS — I5032 Chronic diastolic (congestive) heart failure: Secondary | ICD-10-CM | POA: Diagnosis not present

## 2014-08-16 DIAGNOSIS — I10 Essential (primary) hypertension: Secondary | ICD-10-CM

## 2014-08-16 DIAGNOSIS — I429 Cardiomyopathy, unspecified: Secondary | ICD-10-CM

## 2014-08-16 DIAGNOSIS — I428 Other cardiomyopathies: Secondary | ICD-10-CM

## 2014-08-16 NOTE — Patient Instructions (Signed)
Your physician recommends that you continue on your current medications as directed. Please refer to the Current Medication list given to you today.  Please follow a low sodium diet  Please stay active  Your physician wants you to follow-up in: 1 year with Dr.Smith You will receive a reminder letter in the mail two months in advance. If you don't receive a letter, please call our office to schedule the follow-up appointment.

## 2014-08-16 NOTE — Progress Notes (Signed)
Cardiology Office Note   Date:  08/16/2014   ID:  Jennifer, Rocha January 25, 1928, MRN [id number]  PCP:  Sheela Stack, MD  Cardiologist:   Sinclair Grooms, MD   No chief complaint on file.     History of Present Illness: Jennifer Rocha is a 81 y.o. female who presents for nonischemic cardiomyopathy. We demonstrated reversal of low EF to normal EF. Last documented LVEF was 50%. She has no limitations. There is no orthopnea, PND, or edema. She denies chest pain. No medication side effects.    Past Medical History  Diagnosis Date  . Hypertension   . Non-ischemic cardiomyopathy     with EF 20% improving to 50%  . Ethanol causing toxic effect   . Left heart failure     No past surgical history on file.   Current Outpatient Prescriptions  Medication Sig Dispense Refill  . aspirin EC 81 MG tablet Take 81 mg by mouth daily.    Marland Kitchen atorvastatin (LIPITOR) 40 MG tablet Take 1 tablet by mouth daily.    . carvedilol (COREG) 6.25 MG tablet Take 1 tablet by mouth 2 (two) times daily.     . DULoxetine (CYMBALTA) 30 MG capsule Take 1 capsule by mouth daily.     Marland Kitchen losartan (COZAAR) 100 MG tablet Take 50 mg by mouth daily.    . Multiple Vitamin (MULTIVITAMIN WITH MINERALS) TABS tablet Take 1 tablet by mouth daily.    . Multiple Vitamins-Minerals (ICAPS MV) TABS Take 1 tablet by mouth at bedtime.    . vitamin B-12 (CYANOCOBALAMIN) 100 MCG tablet Take 100 mcg by mouth daily.     No current facility-administered medications for this visit.    Allergies:   Demerol    Social History:  The patient  reports that she has never smoked. She does not have any smokeless tobacco history on file. She reports that she does not drink alcohol or use illicit drugs.   Family History:  [name] family history includes Heart failure in her mother.    ROS:  Please see the history of present illness.   Otherwise, review of systems are positive for occasional cough. No episodes of syncope. An  hearing loss.   All other systems are reviewed and negative.    PHYSICAL EXAM: VS:  BP 120/64 mmHg  Pulse 75  Ht 5\' 3"  (1.6 m)  Wt 145 lb (65.772 kg)  BMI 25.69 kg/m2 , BMI Body mass index is 25.69 kg/(m^2). GEN: Well nourished, well developed, in no acute distress HEENT: normal Neck: no JVD, carotid bruits, or masses Cardiac: RRR; no murmurs, rubs, or gallops,no edema  Respiratory:  clear to auscultation bilaterally, normal work of breathing GI: soft, nontender, nondistended, + BS MS: no deformity or atrophy Skin: warm and dry, no rash Neuro:  Strength and sensation are intact Psych: euthymic mood, full affect   EKG:  EKG is ordered today. The ekg ordered today demonstrates normal sinus rhythm at 75 bpm and otherwise unremarkable   Recent Labs: No results found for requested labs within last 365 days.    Lipid Panel No results found for: CHOL, TRIG, HDL, CHOLHDL, VLDL, LDLCALC, LDLDIRECT    Wt Readings from Last 3 Encounters:  08/16/14 145 lb (65.772 kg)  08/23/13 153 lb (69.4 kg)      Other studies Reviewed: Additional studies/ records that were reviewed today include: .    ASSESSMENT AND PLAN:  Chronic diastolic heart failure: No evidence of volume  overload  Essential hypertension: Controlled  Non-ischemic cardiomyopathy: Resolved to normal LV function    Current medicines are reviewed at length with the patient today.  The patient does not have concerns regarding medicines.  The following changes have been made:  no change  Labs/ tests ordered today include:  No orders of the defined types were placed in this encounter.     Disposition:   FU with Linard Millers, one year.   Signed, Sinclair Grooms, MD  08/16/2014 2:06 PM    [facility] [place], [place], [place]  [address] Phone: [phone]; Fax: [phone]

## 2014-09-13 DIAGNOSIS — F418 Other specified anxiety disorders: Secondary | ICD-10-CM | POA: Diagnosis not present

## 2014-09-13 DIAGNOSIS — E785 Hyperlipidemia, unspecified: Secondary | ICD-10-CM | POA: Diagnosis not present

## 2014-09-13 DIAGNOSIS — I10 Essential (primary) hypertension: Secondary | ICD-10-CM | POA: Diagnosis not present

## 2014-09-13 DIAGNOSIS — Z1389 Encounter for screening for other disorder: Secondary | ICD-10-CM | POA: Diagnosis not present

## 2014-09-13 DIAGNOSIS — E559 Vitamin D deficiency, unspecified: Secondary | ICD-10-CM | POA: Diagnosis not present

## 2014-09-13 DIAGNOSIS — I509 Heart failure, unspecified: Secondary | ICD-10-CM | POA: Diagnosis not present

## 2014-09-13 DIAGNOSIS — I2510 Atherosclerotic heart disease of native coronary artery without angina pectoris: Secondary | ICD-10-CM | POA: Diagnosis not present

## 2014-09-13 DIAGNOSIS — M859 Disorder of bone density and structure, unspecified: Secondary | ICD-10-CM | POA: Diagnosis not present

## 2014-09-13 DIAGNOSIS — R7301 Impaired fasting glucose: Secondary | ICD-10-CM | POA: Diagnosis not present

## 2014-09-13 DIAGNOSIS — Z6826 Body mass index (BMI) 26.0-26.9, adult: Secondary | ICD-10-CM | POA: Diagnosis not present

## 2014-09-19 ENCOUNTER — Ambulatory Visit (HOSPITAL_COMMUNITY)
Admission: RE | Admit: 2014-09-19 | Discharge: 2014-09-19 | Disposition: A | Discharge status: Home / Self Care | Payer: Medicare Other | Source: Ambulatory Visit | Attending: Vascular Surgery | Admitting: Vascular Surgery

## 2014-09-19 ENCOUNTER — Other Ambulatory Visit (HOSPITAL_COMMUNITY): Payer: Self-pay | Admitting: Adult Health

## 2014-09-19 DIAGNOSIS — W1800XA Striking against unspecified object with subsequent fall, initial encounter: Secondary | ICD-10-CM | POA: Diagnosis not present

## 2014-09-19 DIAGNOSIS — R600 Localized edema: Secondary | ICD-10-CM

## 2014-09-19 DIAGNOSIS — Z6826 Body mass index (BMI) 26.0-26.9, adult: Secondary | ICD-10-CM | POA: Diagnosis not present

## 2014-09-19 DIAGNOSIS — J40 Bronchitis, not specified as acute or chronic: Secondary | ICD-10-CM | POA: Diagnosis not present

## 2014-09-19 DIAGNOSIS — S8012XA Contusion of left lower leg, initial encounter: Secondary | ICD-10-CM | POA: Diagnosis not present

## 2014-10-18 DIAGNOSIS — M810 Age-related osteoporosis without current pathological fracture: Secondary | ICD-10-CM | POA: Diagnosis not present

## 2015-01-10 DIAGNOSIS — E785 Hyperlipidemia, unspecified: Secondary | ICD-10-CM | POA: Diagnosis not present

## 2015-01-10 DIAGNOSIS — I10 Essential (primary) hypertension: Secondary | ICD-10-CM | POA: Diagnosis not present

## 2015-01-10 DIAGNOSIS — E559 Vitamin D deficiency, unspecified: Secondary | ICD-10-CM | POA: Diagnosis not present

## 2015-01-10 DIAGNOSIS — N390 Urinary tract infection, site not specified: Secondary | ICD-10-CM | POA: Diagnosis not present

## 2015-01-10 DIAGNOSIS — R7301 Impaired fasting glucose: Secondary | ICD-10-CM | POA: Diagnosis not present

## 2015-01-10 DIAGNOSIS — R8299 Other abnormal findings in urine: Secondary | ICD-10-CM | POA: Diagnosis not present

## 2015-01-17 DIAGNOSIS — I509 Heart failure, unspecified: Secondary | ICD-10-CM | POA: Diagnosis not present

## 2015-01-17 DIAGNOSIS — E785 Hyperlipidemia, unspecified: Secondary | ICD-10-CM | POA: Diagnosis not present

## 2015-01-17 DIAGNOSIS — Z1389 Encounter for screening for other disorder: Secondary | ICD-10-CM | POA: Diagnosis not present

## 2015-01-17 DIAGNOSIS — Z0000 Encounter for general adult medical examination without abnormal findings: Secondary | ICD-10-CM | POA: Diagnosis not present

## 2015-01-17 DIAGNOSIS — F418 Other specified anxiety disorders: Secondary | ICD-10-CM | POA: Diagnosis not present

## 2015-01-17 DIAGNOSIS — R7301 Impaired fasting glucose: Secondary | ICD-10-CM | POA: Diagnosis not present

## 2015-01-17 DIAGNOSIS — I10 Essential (primary) hypertension: Secondary | ICD-10-CM | POA: Diagnosis not present

## 2015-01-17 DIAGNOSIS — Z6825 Body mass index (BMI) 25.0-25.9, adult: Secondary | ICD-10-CM | POA: Diagnosis not present

## 2015-01-17 DIAGNOSIS — M859 Disorder of bone density and structure, unspecified: Secondary | ICD-10-CM | POA: Diagnosis not present

## 2015-01-17 DIAGNOSIS — R251 Tremor, unspecified: Secondary | ICD-10-CM | POA: Diagnosis not present

## 2015-01-23 ENCOUNTER — Ambulatory Visit (INDEPENDENT_AMBULATORY_CARE_PROVIDER_SITE_OTHER): Payer: Medicare Other | Admitting: Neurology

## 2015-01-23 ENCOUNTER — Encounter: Payer: Self-pay | Admitting: Neurology

## 2015-01-23 VITALS — BP 171/70 | HR 70 | Ht 63.0 in | Wt 146.8 lb

## 2015-01-23 DIAGNOSIS — G250 Essential tremor: Secondary | ICD-10-CM

## 2015-01-23 DIAGNOSIS — Z1212 Encounter for screening for malignant neoplasm of rectum: Secondary | ICD-10-CM | POA: Diagnosis not present

## 2015-01-23 NOTE — Patient Instructions (Signed)
Overall you are doing extremely well but I do want to suggest a few things today:   Remember to drink plenty of fluid, eat healthy meals and do not skip any meals. Try to eat protein with a every meal and eat a healthy snack such as fruit or nuts in between meals. Try to keep a regular sleep-wake schedule and try to exercise daily, particularly in the form of walking, 20-30 minutes a day, if you can.   As far as diagnostic testing: None at this time  I would like to see you back in 3 -4 months, sooner if we need to. Please call us with any interim questions, concerns, problems, updates or refill requests.   Please also call us for any test results so we can go over those with you on the phone.  My clinical assistant and will answer any of your questions and relay your messages to me and also relay most of my messages to you.   Our phone number is [phone]. We also have an after hours call service for urgent matters and there is a physician on-call for urgent questions. For any emergencies you know to call 911 or go to the nearest emergency room

## 2015-01-23 NOTE — Progress Notes (Signed)
[facility]    Provider:  Dr [name] Referring Provider: Reynold Bowen, MD Primary Care Physician:  Sheela Stack, MD  CC:  Tremors.   HPI:  Jennifer Rocha is a 81 y.o. female here as a referral from Dr. Forde Dandy for tremors. She has a past medical history of cardiomyopathy, congestive heart failure, hyperlipidemia, coronary artery disease, hypertension, anxiety and depression. She has had tremors "forever". She has had them for many years. Doesn't feel like they are getting worse. Doesn't interfere with her ADLS ot IADLS, no problems with eating or dressing but sometimes may need to use both hands to hold coffee in the morning but has never burned herself. She was adopted so she doesn't have a complete family history, she was the youngest of 54. There is a family hisory of heart disease but no known history of tremors. Worse in the morning when she gets up. No tremor in voice or head. Notices tremor more when performing an action or holding arms up. She does not drink a lot of caffeine. No association with medications. She denies bradycardia kinesia and shuffling shuffling, not really slowing down,  no loss of smell. Smell is excellent. No difficulty walking. Denies any other focal neurologic symptoms.  Reviewed notes, labs and imaging from outside physicians, which showed: She has a history of smoking 6 packs per week for 15+ years but quit in 1998. She consumes 3 glasses of wine per day. She enjoys yard work, Physiological scientist, walking and reading. She has chronic diastolic heart failure without evidence of volume overload. She has controlled essential hypertension. Nonischemic cardiomyopathy. She also has impaired fasting glucose with a hemoglobin A1c of 5.8. She takes carvedilol for her hypertension. She has not been treated for tremor. Notes from primary care state that TSH was ordered but I don't have that family.  Review of Systems: Patient complains of symptoms per HPI as well as  the following symptoms: No chest pain, no shortness of breath. Pertinent negatives per HPI. All others negative.   Social History   Social History  . Marital Status: Widowed    Spouse Name: N/A  . Number of Children: 3  . Years of Education: 16   Occupational History  . Retired     Social History Main Topics  . Smoking status: Never Smoker   . Smokeless tobacco: Not on file  . Alcohol Use: No  . Drug Use: No  . Sexual Activity: Not on file   Other Topics Concern  . Not on file   Social History Narrative   Lives at home by herself   1-2 cups coffee per day       Family History  Problem Relation Age of Onset  . Heart failure Mother     Past Medical History  Diagnosis Date  . Hypertension   . Non-ischemic cardiomyopathy     with EF 20% improving to 50%  . Ethanol causing toxic effect   . Left heart failure     Past Surgical History  Procedure Laterality Date  . Ankle surgery    . Appendectomy  1941    Current Outpatient Prescriptions  Medication Sig Dispense Refill  . aspirin EC 81 MG tablet Take 81 mg by mouth daily.    Marland Kitchen atorvastatin (LIPITOR) 40 MG tablet Take 1 tablet by mouth daily.    . carvedilol (COREG) 6.25 MG tablet Take 1 tablet by mouth 2 (two) times daily.     . DULoxetine (CYMBALTA) 30 MG capsule  Take 1 capsule by mouth daily.     Marland Kitchen losartan (COZAAR) 100 MG tablet Take 50 mg by mouth daily.    . Multiple Vitamins-Minerals (ICAPS MV) TABS Take 2 tablets by mouth at bedtime.     . vitamin B-12 (CYANOCOBALAMIN) 100 MCG tablet Take 100 mcg by mouth daily.     No current facility-administered medications for this visit.    Allergies as of 01/23/2015 - Review Complete 01/23/2015  Allergen Reaction Noted  . Demerol [meperidine] Nausea And Vomiting 06/20/2013    Vitals: BP 171/70 mmHg  Pulse 70  Ht 5\' 3"  (1.6 m)  Wt 146 lb 12.8 oz (66.588 kg)  BMI 26.01 kg/m2 Last Weight:  Wt Readings from Last 1 Encounters:  01/23/15 146 lb 12.8 oz  (66.588 kg)   Last Height:   Ht Readings from Last 1 Encounters:  01/23/15 5\' 3"  (1.6 m)    Physical exam: Exam: Gen: NAD, conversant, well nourised, obese, well groomed                     CV: RRR, no MRG. No Carotid Bruits. No peripheral edema, warm, nontender Eyes: Conjunctivae clear without exudates or hemorrhage  Neuro: Detailed Neurologic Exam  Speech:    Speech is normal; fluent and spontaneous with normal comprehension.  Cognition:    The patient is oriented to person, place, and time;     recent and remote memory intact;     language fluent;     normal attention, concentration,     fund of knowledge Cranial Nerves:    The pupils are equal, round, and reactive to light. The fundi are flat.  Visual fields are full to finger confrontation. Extraocular movements are intact. Trigeminal sensation is intact and the muscles of mastication are normal. The face is symmetric. The palate elevates in the midline. Hearing intact. Voice is normal. Shoulder shrug is normal. The tongue has normal motion without fasciculations.   Coordination:    Normal finger to nose and heel to shin. Normal rapid alternating movements.   Gait:    Heel-toe and tandem gait are normal.   Motor Observation:    Postural and kinetic tremor in the hands, low amplitude and high frequency. Slight tremor of the head.  Tone:    Normal muscle tone.    Posture:    Posture is normal. normal erect    Strength:    Strength is V/V in the upper and lower limbs.      Sensation: intact to LT     Reflex Exam:  DTR's:    Deep tendon reflexes in the upper and lower extremities are normal bilaterally.   Toes:    The toes are downgoing bilaterally.   Clonus:    Clonus is absent.      Assessment/Plan:  Patient is a really lovely 82 year old with a stable tremor that she has had "forever". Not worsening. Exam does not show any parkinsonian features. This is likely a benign essential tremor. Discussed in depth  with patient. Advised her to see if the tremor gets better the next time she has alcohol. It is not interfering with her ADLs or IADLs and patient's not interested in treatment. Discussed we could do an MRI of the brain but that might be low yield, patient declines at this time. I did ask her to follow up with me in the future so I could continue to monitor for progression.  Sarina Ill, MD  [facility] [phone]  [place], [place] [address]  Phone [phone] Fax [phone]

## 2015-01-26 DIAGNOSIS — S9031XA Contusion of right foot, initial encounter: Secondary | ICD-10-CM | POA: Diagnosis not present

## 2015-01-27 DIAGNOSIS — G250 Essential tremor: Secondary | ICD-10-CM | POA: Insufficient documentation

## 2015-02-27 DIAGNOSIS — H04123 Dry eye syndrome of bilateral lacrimal glands: Secondary | ICD-10-CM | POA: Diagnosis not present

## 2015-02-27 DIAGNOSIS — H52203 Unspecified astigmatism, bilateral: Secondary | ICD-10-CM | POA: Diagnosis not present

## 2015-02-27 DIAGNOSIS — H43813 Vitreous degeneration, bilateral: Secondary | ICD-10-CM | POA: Diagnosis not present

## 2015-02-27 DIAGNOSIS — H353132 Nonexudative age-related macular degeneration, bilateral, intermediate dry stage: Secondary | ICD-10-CM | POA: Diagnosis not present

## 2015-04-27 ENCOUNTER — Telehealth: Payer: Self-pay | Admitting: Neurology

## 2015-04-27 NOTE — Telephone Encounter (Signed)
Patient called to cancel 112/6/16 appointment for 3-4 month follow up, patient doesn't feel this appointment is necessary.

## 2015-05-02 ENCOUNTER — Ambulatory Visit: Payer: Medicare Other | Admitting: Neurology

## 2015-07-31 DIAGNOSIS — M810 Age-related osteoporosis without current pathological fracture: Secondary | ICD-10-CM | POA: Diagnosis not present

## 2015-07-31 DIAGNOSIS — I2510 Atherosclerotic heart disease of native coronary artery without angina pectoris: Secondary | ICD-10-CM | POA: Diagnosis not present

## 2015-07-31 DIAGNOSIS — E784 Other hyperlipidemia: Secondary | ICD-10-CM | POA: Diagnosis not present

## 2015-07-31 DIAGNOSIS — I10 Essential (primary) hypertension: Secondary | ICD-10-CM | POA: Diagnosis not present

## 2015-07-31 DIAGNOSIS — Z6826 Body mass index (BMI) 26.0-26.9, adult: Secondary | ICD-10-CM | POA: Diagnosis not present

## 2015-07-31 DIAGNOSIS — Z1389 Encounter for screening for other disorder: Secondary | ICD-10-CM | POA: Diagnosis not present

## 2015-07-31 DIAGNOSIS — M859 Disorder of bone density and structure, unspecified: Secondary | ICD-10-CM | POA: Diagnosis not present

## 2015-07-31 DIAGNOSIS — R7301 Impaired fasting glucose: Secondary | ICD-10-CM | POA: Diagnosis not present

## 2015-07-31 DIAGNOSIS — E559 Vitamin D deficiency, unspecified: Secondary | ICD-10-CM | POA: Diagnosis not present

## 2015-08-02 NOTE — Progress Notes (Signed)
Cardiology Office Note   Date:  08/03/2015   ID:  Jennifer Rocha, DOB [birth year], MRN [id number]  PCP:  Sheela Stack, MD  Cardiologist:  Dr. Tamala Julian    Chief Complaint  Patient presents with  . Chest Pain    one episode      History of Present Illness: Jennifer Rocha is a 82 y.o. female who presents for recent episode of chest pain.   She has a history of nonischemic cardiomyopathy. We demonstrated reversal of low EF to normal EF. Last documented LVEF was 50%. She has no limitations. There is no orthopnea, PND, or edema. She had an episode of chest heainvess associated with nausea - nausea came first then severe chest pressure lasted several minutes no recurrence this was on Ash Wed.   No episodes since.  She saw Dr. Forde Dandy and he referred her here. She has no SOB with exertion, no other chest pain and maintains her active schedule.   Other hx HTN, LHF.stong family hx of CAD and heart transplant in hr biological family.    Past Medical History  Diagnosis Date  . Hypertension   . Non-ischemic cardiomyopathy ([place])     with EF 20% improving to 50%  . Ethanol causing toxic effect   . Left heart failure [facility])     Past Surgical History  Procedure Laterality Date  . Ankle surgery    . Appendectomy  1941     Current Outpatient Prescriptions  Medication Sig Dispense Refill  . aspirin EC 81 MG tablet Take 81 mg by mouth daily.    Marland Kitchen atorvastatin (LIPITOR) 40 MG tablet Take 1 tablet by mouth daily.    . carvedilol (COREG) 6.25 MG tablet Take 1 tablet by mouth 2 (two) times daily.     . DULoxetine (CYMBALTA) 30 MG capsule Take 1 capsule by mouth daily.     . ferrous sulfate 325 (65 FE) MG tablet Take 325 mg by mouth daily with breakfast.    . losartan (COZAAR) 100 MG tablet Take 50 mg by mouth daily.    . Multiple Vitamins-Minerals (ICAPS MV) TABS Take 2 tablets by mouth at bedtime.      No current facility-administered medications for this visit.    Allergies:   Demerol     Social History:  The patient  reports that she has never smoked. She does not have any smokeless tobacco history on file. She reports that she does not drink alcohol or use illicit drugs.   Family History:  [name] family history includes Heart failure in her mother.    ROS:  General:no colds or fevers, no weight changes Skin:no rashes or ulcers HEENT:no blurred vision, no congestion CV:see HPI PUL:see HPI GI:no diarrhea constipation or melena, no indigestion GU:no hematuria, no dysuria MS:no joint pain, no claudication Neuro:no syncope, no lightheadedness Endo:no diabetes, no thyroid disease  Wt Readings from Last 3 Encounters:  08/03/15 147 lb (66.679 kg)  01/23/15 146 lb 12.8 oz (66.588 kg)  08/16/14 145 lb (65.772 kg)     PHYSICAL EXAM: VS:  BP 134/64 mmHg  Pulse 80  Ht 5\' 3"  (1.6 m)  Wt 147 lb (66.679 kg)  BMI 26.05 kg/m2 , BMI Body mass index is 26.05 kg/(m^2). General:Pleasant affect, NAD Skin:Warm and dry, brisk capillary refill HEENT:normocephalic, sclera clear, mucus membranes moist Neck:supple, no JVD, no bruits  Heart:S1S2 RRR without murmur, gallup, rub or click Lungs:clear without rales, rhonchi, or wheezes [id number], non tender, + BS, do not  palpate liver spleen or masses Ext:no lower ext edema, 2+ pedal pulses, 2+ radial pulses Neuro:alert and oriented, MAE, follows commands, + facial symmetry    EKG:  EKG is ordered today. The ekg ordered today demonstrates SR 1st degree AV block no acute changes from previous tracing.    Recent Labs: No results found for requested labs within last 365 days.    Lipid Panel No results found for: CHOL, TRIG, HDL, CHOLHDL, VLDL, LDLCALC, LDLDIRECT     Other studies Reviewed: Additional studies/ records that were reviewed today include: Previous office notes. .   ASSESSMENT AND PLAN:  1. Chest pain one episode no changes in EKG and no further episodes.  Will do lexiscan myoview to rule out ischemia.   Will call results and review with Dr. Tamala Julian.  2.  Chronic diastolic heart failure: No evidence of volume overload  3.  Essential hypertension: Controlled  4. Non-ischemic cardiomyopathy: Resolved to normal LV function    Current medicines are reviewed with the patient today.  The patient Has no concerns regarding medicines.  The following changes have been made:  See above Labs/ tests ordered today include:see above  Disposition:   FU:  see above  Lennie Muckle, NP  08/03/2015 6:08 PM    [facility] [place], [place], [place], [place] Phone: [phone]; Fax: [phone]

## 2015-08-03 ENCOUNTER — Ambulatory Visit (INDEPENDENT_AMBULATORY_CARE_PROVIDER_SITE_OTHER): Payer: Medicare Other | Admitting: Cardiology

## 2015-08-03 ENCOUNTER — Encounter: Payer: Self-pay | Admitting: Cardiology

## 2015-08-03 VITALS — BP 134/64 | HR 80 | Ht 63.0 in | Wt 147.0 lb

## 2015-08-03 DIAGNOSIS — I10 Essential (primary) hypertension: Secondary | ICD-10-CM | POA: Diagnosis not present

## 2015-08-03 DIAGNOSIS — I429 Cardiomyopathy, unspecified: Secondary | ICD-10-CM

## 2015-08-03 DIAGNOSIS — R600 Localized edema: Secondary | ICD-10-CM | POA: Diagnosis not present

## 2015-08-03 DIAGNOSIS — R079 Chest pain, unspecified: Secondary | ICD-10-CM

## 2015-08-03 DIAGNOSIS — I428 Other cardiomyopathies: Secondary | ICD-10-CM

## 2015-08-03 DIAGNOSIS — I5032 Chronic diastolic (congestive) heart failure: Secondary | ICD-10-CM | POA: Diagnosis not present

## 2015-08-03 NOTE — Patient Instructions (Addendum)
Medication Instructions:  Your physician recommends that you continue on your current medications as directed. Please refer to the Current Medication list given to you today.   Labwork: None ordered  Testing/Procedures: Your physician has requested that you have a lexiscan myoview. For further information please visit [email or web address]. Please follow instruction sheet, as given.   Follow-Up: Your physician wants you to follow-up in: Jennifer Rocha will receive a reminder letter in the mail two months in advance. If you don't receive a letter, please call our office to schedule the follow-up appointment.    Any Other Special Instructions Will Be Listed Below (If Applicable). Pharmacologic Stress Electrocardiogram A pharmacologic stress electrocardiogram is a heart (cardiac) test that uses nuclear imaging to evaluate the blood supply to your heart. This test may also be called a pharmacologic stress electrocardiography. Pharmacologic means that a medicine is used to increase your heart rate and blood pressure.  This stress test is done to find areas of poor blood flow to the heart by determining the extent of coronary artery disease (CAD). Some people exercise on a treadmill, which naturally increases the blood flow to the heart. For those people unable to exercise on a treadmill, a medicine is used. This medicine stimulates your heart and will cause your heart to beat harder and more quickly, as if you were exercising.  Pharmacologic stress tests can help determine:  The adequacy of blood flow to your heart during increased levels of activity in order to clear you for discharge home.  The extent of coronary artery blockage caused by CAD.  Your prognosis if you have suffered a heart attack.  The effectiveness of cardiac procedures done, such as an angioplasty, which can increase the circulation in your coronary arteries.  Causes of chest pain or pressure. LET [facility]  CARE PROVIDER KNOW ABOUT:  Any allergies you have.  All medicines you are taking, including vitamins, herbs, eye drops, creams, and over-the-counter medicines.  Previous problems you or members of your family have had with the use of anesthetics.  Any blood disorders you have.  Previous surgeries you have had.  Medical conditions you have.  Possibility of pregnancy, if this applies.  If you are currently breastfeeding. RISKS AND COMPLICATIONS Generally, this is a safe procedure. However, as with any procedure, complications can occur. Possible complications include:  You develop pain or pressure in the following areas:  Chest.  Jaw or neck.  Between your shoulder blades.  Radiating down your left arm.  Headache.  Dizziness or light-headedness.  Shortness of breath.  Increased or irregular heartbeat.  Low blood pressure.  Nausea or vomiting.  Flushing.  Redness going up the arm and slight pain during injection of medicine.  Heart attack (rare). BEFORE THE PROCEDURE   Avoid all forms of caffeine for 24 hours before your test or as directed by your health care provider. This includes coffee, tea (even decaffeinated tea), caffeinated sodas, chocolate, cocoa, and certain pain medicines.  Follow your health care provider's instructions regarding eating and drinking before the test.  Take your medicines as directed at regular times with water unless instructed otherwise. Exceptions may include:  If you have diabetes, ask how you are to take your insulin or pills. It is common to adjust insulin dosing the morning of the test.  If you are taking beta-blocker medicines, it is important to talk to your health care provider about these medicines well before the date of your test. Taking  beta-blocker medicines may interfere with the test. In some cases, these medicines need to be changed or stopped 24 hours or more before the test.  If you wear a nitroglycerin patch, it  may need to be removed prior to the test. Ask your health care provider if the patch should be removed before the test.  If you use an inhaler for any breathing condition, bring it with you to the test.  If you are an outpatient, bring a snack so you can eat right after the stress phase of the test.  Do not smoke for 4 hours prior to the test or as directed by your health care provider.  Do not apply lotions, powders, creams, or oils on your chest prior to the test.  Wear comfortable shoes and clothing. Let your health care provider know if you were unable to complete or follow the preparations for your test. PROCEDURE   Multiple patches (electrodes) will be put on your chest. If needed, small areas of your chest may be shaved to get better contact with the electrodes. Once the electrodes are attached to your body, multiple wires will be attached to the electrodes, and your heart rate will be monitored.  An IV access will be started. A nuclear trace (isotope) is given. The isotope may be given intravenously, or it may be swallowed. Nuclear refers to several types of radioactive isotopes, and the nuclear isotope lights up the arteries so that the nuclear images are clear. The isotope is absorbed by your body. This results in low radiation exposure.  A resting nuclear image is taken to show how your heart functions at rest.  A medicine is given through the IV access.  A second scan is done about 1 hour after the medicine injection and determines how your heart functions under stress.  During this stress phase, you will be connected to an electrocardiogram machine. Your blood pressure and oxygen levels will be monitored. AFTER THE PROCEDURE   Your heart rate and blood pressure will be monitored after the test.  You may return to your normal schedule, including diet,activities, and medicines, unless your health care provider tells you otherwise.   This information is not intended to  replace advice given to you by your health care provider. Make sure you discuss any questions you have with your health care provider.   Document Released: 09/29/2008 Document Revised: 05/18/2013 Document Reviewed: 01/18/2013 Elsevier Interactive Patient Education [employer].   If you need a refill on your cardiac medications before your next appointment, please call your pharmacy.

## 2015-08-09 ENCOUNTER — Telehealth (HOSPITAL_COMMUNITY): Payer: Self-pay | Admitting: *Deleted

## 2015-08-09 NOTE — Telephone Encounter (Signed)
Patient given detailed instructions per Myocardial Perfusion Study Information Sheet for the test on 08/11/15. Patient notified to arrive 15 minutes early and that it is imperative to arrive on time for appointment to keep from having the test rescheduled.  If you need to cancel or reschedule your appointment, please call the office within 24 hours of your appointment. Failure to do so may result in a cancellation of your appointment, and a $50 no show fee. Patient verbalized understanding Hubbard Robinson, RN

## 2015-08-11 ENCOUNTER — Ambulatory Visit (HOSPITAL_COMMUNITY): Payer: Medicare Other | Attending: Internal Medicine

## 2015-08-11 DIAGNOSIS — R9439 Abnormal result of other cardiovascular function study: Secondary | ICD-10-CM | POA: Insufficient documentation

## 2015-08-11 DIAGNOSIS — R0789 Other chest pain: Secondary | ICD-10-CM | POA: Insufficient documentation

## 2015-08-11 DIAGNOSIS — R42 Dizziness and giddiness: Secondary | ICD-10-CM | POA: Insufficient documentation

## 2015-08-11 DIAGNOSIS — R079 Chest pain, unspecified: Secondary | ICD-10-CM

## 2015-08-11 DIAGNOSIS — I10 Essential (primary) hypertension: Secondary | ICD-10-CM | POA: Insufficient documentation

## 2015-08-11 LAB — MYOCARDIAL PERFUSION IMAGING
LV dias vol: 102 mL (ref 46–106)
LV sys vol: 47 mL
Peak HR: 100 {beats}/min
RATE: 0.32
Rest HR: 75 {beats}/min
SDS: 1
SRS: 6
SSS: 7
TID: 1.09

## 2015-08-11 MED ORDER — TECHNETIUM TC 99M SESTAMIBI GENERIC - CARDIOLITE
30.8000 | Freq: Once | INTRAVENOUS | Status: AC | PRN
  Administered 2015-08-11: 30.8 via INTRAVENOUS

## 2015-08-11 MED ORDER — TECHNETIUM TC 99M SESTAMIBI GENERIC - CARDIOLITE
10.9000 | Freq: Once | INTRAVENOUS | Status: AC | PRN
  Administered 2015-08-11: 10.9 via INTRAVENOUS

## 2015-08-11 MED ORDER — REGADENOSON 0.4 MG/5ML IV SOLN
0.4000 mg | Freq: Once | INTRAVENOUS | Status: AC
  Administered 2015-08-11: 0.4 mg via INTRAVENOUS

## 2015-11-01 DIAGNOSIS — Z961 Presence of intraocular lens: Secondary | ICD-10-CM | POA: Diagnosis not present

## 2015-11-01 DIAGNOSIS — H353132 Nonexudative age-related macular degeneration, bilateral, intermediate dry stage: Secondary | ICD-10-CM | POA: Diagnosis not present

## 2015-11-01 DIAGNOSIS — H52203 Unspecified astigmatism, bilateral: Secondary | ICD-10-CM | POA: Diagnosis not present

## 2015-11-01 DIAGNOSIS — H43813 Vitreous degeneration, bilateral: Secondary | ICD-10-CM | POA: Diagnosis not present

## 2015-12-28 DIAGNOSIS — R05 Cough: Secondary | ICD-10-CM | POA: Diagnosis not present

## 2015-12-28 DIAGNOSIS — Z6826 Body mass index (BMI) 26.0-26.9, adult: Secondary | ICD-10-CM | POA: Diagnosis not present

## 2015-12-28 DIAGNOSIS — R062 Wheezing: Secondary | ICD-10-CM | POA: Diagnosis not present

## 2015-12-28 DIAGNOSIS — I10 Essential (primary) hypertension: Secondary | ICD-10-CM | POA: Diagnosis not present

## 2015-12-28 DIAGNOSIS — J40 Bronchitis, not specified as acute or chronic: Secondary | ICD-10-CM | POA: Diagnosis not present

## 2016-01-15 DIAGNOSIS — E559 Vitamin D deficiency, unspecified: Secondary | ICD-10-CM | POA: Diagnosis not present

## 2016-01-15 DIAGNOSIS — R7301 Impaired fasting glucose: Secondary | ICD-10-CM | POA: Diagnosis not present

## 2016-01-15 DIAGNOSIS — I10 Essential (primary) hypertension: Secondary | ICD-10-CM | POA: Diagnosis not present

## 2016-01-15 DIAGNOSIS — E784 Other hyperlipidemia: Secondary | ICD-10-CM | POA: Diagnosis not present

## 2016-01-22 DIAGNOSIS — D649 Anemia, unspecified: Secondary | ICD-10-CM | POA: Diagnosis not present

## 2016-01-22 DIAGNOSIS — Z0000 Encounter for general adult medical examination without abnormal findings: Secondary | ICD-10-CM | POA: Diagnosis not present

## 2016-01-22 DIAGNOSIS — I2510 Atherosclerotic heart disease of native coronary artery without angina pectoris: Secondary | ICD-10-CM | POA: Diagnosis not present

## 2016-01-22 DIAGNOSIS — I509 Heart failure, unspecified: Secondary | ICD-10-CM | POA: Diagnosis not present

## 2016-01-22 DIAGNOSIS — M859 Disorder of bone density and structure, unspecified: Secondary | ICD-10-CM | POA: Diagnosis not present

## 2016-01-22 DIAGNOSIS — R251 Tremor, unspecified: Secondary | ICD-10-CM | POA: Diagnosis not present

## 2016-01-22 DIAGNOSIS — R7301 Impaired fasting glucose: Secondary | ICD-10-CM | POA: Diagnosis not present

## 2016-01-22 DIAGNOSIS — Z23 Encounter for immunization: Secondary | ICD-10-CM | POA: Diagnosis not present

## 2016-01-22 DIAGNOSIS — I10 Essential (primary) hypertension: Secondary | ICD-10-CM | POA: Diagnosis not present

## 2016-01-22 DIAGNOSIS — E784 Other hyperlipidemia: Secondary | ICD-10-CM | POA: Diagnosis not present

## 2016-01-22 DIAGNOSIS — E559 Vitamin D deficiency, unspecified: Secondary | ICD-10-CM | POA: Diagnosis not present

## 2016-01-22 DIAGNOSIS — Z6827 Body mass index (BMI) 27.0-27.9, adult: Secondary | ICD-10-CM | POA: Diagnosis not present

## 2016-01-31 ENCOUNTER — Encounter: Payer: Self-pay | Admitting: Internal Medicine

## 2016-04-04 ENCOUNTER — Encounter: Payer: Self-pay | Admitting: Internal Medicine

## 2016-04-04 ENCOUNTER — Ambulatory Visit (INDEPENDENT_AMBULATORY_CARE_PROVIDER_SITE_OTHER): Payer: Medicare Other | Admitting: Internal Medicine

## 2016-04-04 ENCOUNTER — Other Ambulatory Visit (INDEPENDENT_AMBULATORY_CARE_PROVIDER_SITE_OTHER): Payer: Medicare Other

## 2016-04-04 VITALS — BP 132/74 | HR 88 | Ht 62.0 in | Wt 149.1 lb

## 2016-04-04 DIAGNOSIS — D509 Iron deficiency anemia, unspecified: Secondary | ICD-10-CM

## 2016-04-04 LAB — CBC WITH DIFFERENTIAL/PLATELET
Basophils Absolute: 0 10*3/uL (ref 0.0–0.1)
Basophils Relative: 0.2 % (ref 0.0–3.0)
Eosinophils Absolute: 0.2 10*3/uL (ref 0.0–0.7)
Eosinophils Relative: 2.8 % (ref 0.0–5.0)
HCT: 37.5 % (ref 36.0–46.0)
Hemoglobin: 12.5 g/dL (ref 12.0–15.0)
Lymphocytes Relative: 24.4 % (ref 12.0–46.0)
Lymphs Abs: 1.5 10*3/uL (ref 0.7–4.0)
MCHC: 33.2 g/dL (ref 30.0–36.0)
MCV: 90.1 fl (ref 78.0–100.0)
Monocytes Absolute: 0.6 10*3/uL (ref 0.1–1.0)
Monocytes Relative: 9.8 % (ref 3.0–12.0)
Neutro Abs: 3.8 10*3/uL (ref 1.4–7.7)
Neutrophils Relative %: 62.8 % (ref 43.0–77.0)
Platelets: 192 10*3/uL (ref 150.0–400.0)
RBC: 4.16 Mil/uL (ref 3.87–5.11)
RDW: 22 % — ABNORMAL HIGH (ref 11.5–15.5)
WBC: 6 10*3/uL (ref 4.0–10.5)

## 2016-04-04 LAB — FERRITIN: Ferritin: 31.1 ng/mL (ref 10.0–291.0)

## 2016-04-04 MED ORDER — NA SULFATE-K SULFATE-MG SULF 17.5-3.13-1.6 GM/177ML PO SOLN: 1.0000 | Freq: Once | ORAL | 0 refills | Status: AC

## 2016-04-04 NOTE — Progress Notes (Signed)
HISTORY OF PRESENT ILLNESS:  Jennifer Rocha is a 82 y.o. female with a history of congestive heart failure (most recent ejection fraction 50%) and hypertension. She is referred today by Dr. Reynold Bowen with chief complaint of iron deficiency anemia. Patient has been followed by Dr. Forde Dandy for years blood work from August 2017 revealed anemia with hemoglobin of 9.6 and MCV 88.5. Normal liver tests and creatinine. B12 level was normal. However she was found to be iron deficient with both low iron saturation and low ferritin level (outside laboratories and office note reviewed). She was placed on oral iron therapy and set up for this appointment. Patient's GI review of systems is remarkable for dark stool only since starting iron. She also can have significant regurgitation of gastric contents at night but denies heartburn or dysphagia. No abdominal pain. No change in bowel habits. No melena or hematochezia. No weight loss. She does not donate blood.  REVIEW OF SYSTEMS:  All non-GI ROS negative except for cough  Past Medical History:  Diagnosis Date  . Ethanol causing toxic effect   . HLD (hyperlipidemia)   . Hypertension   . Left heart failure ([place])   . Non-ischemic cardiomyopathy ([place])    with EF 20% improving to 50%    Past Surgical History:  Procedure Laterality Date  . ANKLE SURGERY Right   . APPENDECTOMY  1941  . TONSILLECTOMY      Social History Jennifer Rocha  reports that she quit smoking about 62 years ago. She has never used smokeless tobacco. She reports that she drinks about 12.6 oz of alcohol per week . She reports that she does not use drugs.  family history includes Heart disease in her brother; Heart failure in her mother. She was adopted.  Allergies  Allergen Reactions  . Demerol [Meperidine] Nausea And Vomiting  . Novocain [Procaine]        PHYSICAL EXAMINATION: Vital signs: BP 132/74 (BP Location: Left Arm, Patient Position: Sitting, Cuff Size: Normal)    Pulse 88   Ht 5\' 2"  (1.575 m) Comment: height measured without shoes  Wt 149 lb 2 oz (67.6 kg)   BMI 27.28 kg/m   Constitutional: generally well-appearing, no acute distress Psychiatric: alert and oriented x3, cooperative Eyes: extraocular movements intact, anicteric, conjunctiva pink Mouth: oral pharynx moist, no lesions Neck: supple no lymphadenopathy Cardiovascular: heart regular rate and rhythm, no murmur Lungs: clear to auscultation bilaterally Abdomen: soft, nontender, nondistended, no obvious ascites, no peritoneal signs, normal bowel sounds, no organomegaly Rectal:Deferred until colonoscopy Extremities: no clubbing cyanosis or lower extremity edema bilaterally Skin: no lesions on visible extremities Neuro: No focal deficits. Cranial nerves intact  ASSESSMENT:  #1. Iron deficiency anemia. Likely due to occult GI blood loss. Iron absorption disorder less likely. Has been on iron therapy for about 2 months. Possible causes include esophagitis, AVMs, ulcers, Cameron erosions, thin neoplasia. We discussed these entities   PLAN:  #1. Colonoscopy and upper endoscopy. The patient is high-risk given her age and comorbidities..The nature of the procedure, as well as the risks, benefits, and alternatives were carefully and thoroughly reviewed with the patient. Ample time for discussion and questions allowed. The patient understood, was satisfied, and agreed to proceed. #2. Follow-up CBC and ferritin level today to see if she is responding  A copy of this note has been sent to Dr. Forde Dandy

## 2016-04-04 NOTE — Patient Instructions (Signed)
Your physician has requested that you go to the basement for the following lab work before leaving today:  CBC, Ferritin  You have been scheduled for an endoscopy and colonoscopy. Please follow the written instructions given to you at your visit today. Please pick up your prep supplies at the pharmacy within the next 1-3 days. If you use inhalers (even only as needed), please bring them with you on the day of your procedure. Your physician has requested that you go to www.startemmi.com and enter the access code given to you at your visit today. This web site gives a general overview about your procedure. However, you should still follow specific instructions given to you by our office regarding your preparation for the procedure.   Hold your iron for one week prior to the procedure

## 2016-04-15 DIAGNOSIS — R05 Cough: Secondary | ICD-10-CM | POA: Diagnosis not present

## 2016-04-15 DIAGNOSIS — Z6827 Body mass index (BMI) 27.0-27.9, adult: Secondary | ICD-10-CM | POA: Diagnosis not present

## 2016-04-15 DIAGNOSIS — J209 Acute bronchitis, unspecified: Secondary | ICD-10-CM | POA: Diagnosis not present

## 2016-04-23 ENCOUNTER — Telehealth: Payer: Self-pay

## 2016-04-23 DIAGNOSIS — M810 Age-related osteoporosis without current pathological fracture: Secondary | ICD-10-CM | POA: Diagnosis not present

## 2016-04-23 DIAGNOSIS — F418 Other specified anxiety disorders: Secondary | ICD-10-CM | POA: Diagnosis not present

## 2016-04-23 DIAGNOSIS — Z6826 Body mass index (BMI) 26.0-26.9, adult: Secondary | ICD-10-CM | POA: Diagnosis not present

## 2016-04-23 DIAGNOSIS — I2510 Atherosclerotic heart disease of native coronary artery without angina pectoris: Secondary | ICD-10-CM | POA: Diagnosis not present

## 2016-04-23 DIAGNOSIS — E559 Vitamin D deficiency, unspecified: Secondary | ICD-10-CM | POA: Diagnosis not present

## 2016-04-23 DIAGNOSIS — E784 Other hyperlipidemia: Secondary | ICD-10-CM | POA: Diagnosis not present

## 2016-04-23 DIAGNOSIS — R7301 Impaired fasting glucose: Secondary | ICD-10-CM | POA: Diagnosis not present

## 2016-04-23 DIAGNOSIS — D6489 Other specified anemias: Secondary | ICD-10-CM | POA: Diagnosis not present

## 2016-04-23 NOTE — Telephone Encounter (Signed)
Dr. Sissy Hoff   I spoke with Lattie Haw at Dr. Baldwin Crown office. Dr. Forde Dandy and the patient would like to postpone her endo/colon until the patient returns to his office in February. At that point if her iron levels/hgb/hct continue to decline they will want to have it rescheduled, if stable they will opt. To hold off. Procedure cancelled.

## 2016-04-23 NOTE — Telephone Encounter (Signed)
Her wishes noted. We had a full discussion regarding potential etiologies, including cancer

## 2016-04-24 DIAGNOSIS — M2042 Other hammer toe(s) (acquired), left foot: Secondary | ICD-10-CM | POA: Diagnosis not present

## 2016-04-24 DIAGNOSIS — M79675 Pain in left toe(s): Secondary | ICD-10-CM | POA: Diagnosis not present

## 2016-06-05 ENCOUNTER — Encounter: Payer: Medicare Other | Admitting: Internal Medicine

## 2016-07-02 DIAGNOSIS — E784 Other hyperlipidemia: Secondary | ICD-10-CM | POA: Diagnosis not present

## 2016-07-02 DIAGNOSIS — D649 Anemia, unspecified: Secondary | ICD-10-CM | POA: Diagnosis not present

## 2016-07-02 DIAGNOSIS — R7301 Impaired fasting glucose: Secondary | ICD-10-CM | POA: Diagnosis not present

## 2016-07-02 DIAGNOSIS — Z6825 Body mass index (BMI) 25.0-25.9, adult: Secondary | ICD-10-CM | POA: Diagnosis not present

## 2016-07-02 DIAGNOSIS — I10 Essential (primary) hypertension: Secondary | ICD-10-CM | POA: Diagnosis not present

## 2016-07-02 DIAGNOSIS — I2510 Atherosclerotic heart disease of native coronary artery without angina pectoris: Secondary | ICD-10-CM | POA: Diagnosis not present

## 2016-07-02 DIAGNOSIS — E559 Vitamin D deficiency, unspecified: Secondary | ICD-10-CM | POA: Diagnosis not present

## 2016-07-04 DIAGNOSIS — H04123 Dry eye syndrome of bilateral lacrimal glands: Secondary | ICD-10-CM | POA: Diagnosis not present

## 2016-07-04 DIAGNOSIS — H353132 Nonexudative age-related macular degeneration, bilateral, intermediate dry stage: Secondary | ICD-10-CM | POA: Diagnosis not present

## 2016-07-04 DIAGNOSIS — Z961 Presence of intraocular lens: Secondary | ICD-10-CM | POA: Diagnosis not present

## 2016-07-04 DIAGNOSIS — H16103 Unspecified superficial keratitis, bilateral: Secondary | ICD-10-CM | POA: Diagnosis not present

## 2016-08-21 ENCOUNTER — Encounter: Payer: Self-pay | Admitting: Interventional Cardiology

## 2016-09-02 NOTE — Progress Notes (Signed)
Cardiology Office Note    Date:  09/03/2016   ID:  Jennifer, Rocha 09-Aug-1927, MRN [id number]  PCP:  Sheela Stack, MD  Cardiologist: Sinclair Grooms, MD   Chief Complaint  Patient presents with  . Congestive Heart Failure    History of Present Illness:  Jennifer Rocha is a 83 y.o. female who presents for nonischemic cardiomyopathy. We demonstrated reversal of low EF to normal EF.  She has doing well. She has no cardiac complaints. She no longer drinks alcohol.   Past Medical History:  Diagnosis Date  . Ethanol causing toxic effect   . HLD (hyperlipidemia)   . Hypertension   . Left heart failure ([place])   . Non-ischemic cardiomyopathy ([place])    with EF 20% improving to 50%    Past Surgical History:  Procedure Laterality Date  . ANKLE SURGERY Right   . APPENDECTOMY  1941  . TONSILLECTOMY      Current Medications: Outpatient Medications Prior to Visit  Medication Sig Dispense Refill  . aspirin EC 81 MG tablet Take 81 mg by mouth daily.    Marland Kitchen atorvastatin (LIPITOR) 40 MG tablet Take 1 tablet by mouth daily.    . carvedilol (COREG) 6.25 MG tablet Take 1 tablet by mouth 2 (two) times daily.     . DULoxetine (CYMBALTA) 30 MG capsule Take 1 capsule by mouth daily.     . Multiple Vitamins-Minerals (ICAPS MV) TABS Take 2 tablets by mouth at bedtime.     . Polysaccharide Iron Complex (POLY-IRON 150 PO) Take 1 tablet by mouth 2 (two) times daily.    Marland Kitchen losartan (COZAAR) 100 MG tablet Take 50 mg by mouth daily.     No facility-administered medications prior to visit.      Allergies:   Demerol [meperidine] and Novocain [procaine]   Social History   Social History  . Marital status: Widowed    Spouse name: N/A  . Number of children: 3  . Years of education: 25   Occupational History  . Retired     Social History Main Topics  . Smoking status: Former Smoker    Quit date: 05/27/1953  . Smokeless tobacco: Never Used  . Alcohol use 12.6 oz/week    21 Glasses  of wine per week     Comment: 3 glasses per day  . Drug use: No  . Sexual activity: Not Asked   Other Topics Concern  . None   Social History Narrative   Lives at home by herself   1-2 cups coffee per day        Family History:  [name] family history includes Heart disease in her brother; Heart failure in her mother. She was adopted.   ROS:   Please see the history of present illness.    Occasional cough. Decreased hearing. Easy bruising.  All other systems reviewed and are negative.   PHYSICAL EXAM:   VS:  BP 118/70 (BP Location: Right Arm)   Pulse 81   Ht 5\' 3"  (1.6 m)   Wt 144 lb 9.6 oz (65.6 kg)   BMI 25.61 kg/m    GEN: Well nourished, well developed, in no acute distress . Appears well preserved at age 29. HEENT: normal  Neck: no JVD, carotid bruits, or masses Cardiac: RRR; no murmurs, rubs, or gallops,no edema  Respiratory:  clear to auscultation bilaterally, normal work of breathing GI: soft, nontender, nondistended, + BS MS: no deformity or atrophy  Skin: warm  and dry, no rash Neuro:  Alert and Oriented x 3, Strength and sensation are intact Psych: euthymic mood, full affect  Wt Readings from Last 3 Encounters:  09/03/16 144 lb 9.6 oz (65.6 kg)  04/04/16 149 lb 2 oz (67.6 kg)  08/11/15 147 lb (66.7 kg)      Studies/Labs Reviewed:   EKG:  EKG  Sinus rhythm with overall normal appearance  Recent Labs: 04/04/2016: Hemoglobin 12.5; Platelets 192.0   Lipid Panel No results found for: CHOL, TRIG, HDL, CHOLHDL, VLDL, LDLCALC, LDLDIRECT  Additional studies/ records that were reviewed today include:  Myocardial perfusion imaging done on 08/11/15: Study Highlights    The left ventricular ejection fraction is mildly decreased (45-54%).  Nuclear stress EF: 54%.  There was no ST segment deviation noted during stress.  This is a low risk study.   Normal stress nuclear study with a small, mild, fixed inferoseptal defect likely from attenuation; no  ischemia; EF 54 with normal wall motion.      ASSESSMENT:    1. Chronic diastolic heart failure ([place])   2. Non-ischemic cardiomyopathy ([place])   3. Essential hypertension   4. Toxic effect of ethanol, unintentional, subsequent encounter      PLAN:  In order of problems listed above:  1. Systolic heart failure has resolved to diastolic heart failure. No volume overload or symptoms of heart failure. Continue current medical regimen. 2. Possibly related to excessive EtOH intake in the remote past. Resolution of LV systolic dysfunction has occurred. Last documented EF was 54% by perfusion imaging regional wall motion study in 2017. 3. Well controlled. 2 g sodium diet.  Overall doing well. Does not necessarily need yearly follow-up. Patient prefers to be rechecked from time to time. We will see her in the next 1-1.5 years.    Medication Adjustments/Labs and Tests Ordered: Current medicines are reviewed at length with the patient today.  Concerns regarding medicines are outlined above.  Medication changes, Labs and Tests ordered today are listed in the Patient Instructions below. There are no Patient Instructions on file for this visit.   Signed, Sinclair Grooms, MD  09/03/2016 11:23 AM    [facility] [address], [place], [place]  [address] Phone: [phone]; Fax: [phone]

## 2016-09-03 ENCOUNTER — Encounter: Payer: Self-pay | Admitting: Interventional Cardiology

## 2016-09-03 ENCOUNTER — Ambulatory Visit (INDEPENDENT_AMBULATORY_CARE_PROVIDER_SITE_OTHER): Payer: Medicare Other | Admitting: Interventional Cardiology

## 2016-09-03 VITALS — BP 118/70 | HR 81 | Ht 63.0 in | Wt 144.6 lb

## 2016-09-03 DIAGNOSIS — I428 Other cardiomyopathies: Secondary | ICD-10-CM

## 2016-09-03 DIAGNOSIS — T510X1D Toxic effect of ethanol, accidental (unintentional), subsequent encounter: Secondary | ICD-10-CM | POA: Diagnosis not present

## 2016-09-03 DIAGNOSIS — I5032 Chronic diastolic (congestive) heart failure: Secondary | ICD-10-CM | POA: Diagnosis not present

## 2016-09-03 DIAGNOSIS — I10 Essential (primary) hypertension: Secondary | ICD-10-CM | POA: Diagnosis not present

## 2016-09-03 NOTE — Patient Instructions (Signed)
Medication Instructions:  None  Labwork: None  Testing/Procedures: None  Follow-Up: Your physician wants you to follow-up in: 1-1.5 years with Dr. Smith. You will receive a reminder letter in the mail two months in advance. If you don't receive a letter, please call our office to schedule the follow-up appointment.   Any Other Special Instructions Will Be Listed Below (If Applicable).     If you need a refill on your cardiac medications before your next appointment, please call your pharmacy.   

## 2016-09-24 DIAGNOSIS — K5732 Diverticulitis of large intestine without perforation or abscess without bleeding: Secondary | ICD-10-CM | POA: Diagnosis not present

## 2016-09-24 DIAGNOSIS — M8580 Other specified disorders of bone density and structure, unspecified site: Secondary | ICD-10-CM | POA: Diagnosis not present

## 2016-09-24 DIAGNOSIS — F418 Other specified anxiety disorders: Secondary | ICD-10-CM | POA: Diagnosis not present

## 2016-09-24 DIAGNOSIS — I2510 Atherosclerotic heart disease of native coronary artery without angina pectoris: Secondary | ICD-10-CM | POA: Diagnosis not present

## 2016-09-24 DIAGNOSIS — E784 Other hyperlipidemia: Secondary | ICD-10-CM | POA: Diagnosis not present

## 2016-09-24 DIAGNOSIS — D649 Anemia, unspecified: Secondary | ICD-10-CM | POA: Diagnosis not present

## 2016-09-24 DIAGNOSIS — Z6826 Body mass index (BMI) 26.0-26.9, adult: Secondary | ICD-10-CM | POA: Diagnosis not present

## 2016-09-24 DIAGNOSIS — I10 Essential (primary) hypertension: Secondary | ICD-10-CM | POA: Diagnosis not present

## 2016-09-24 DIAGNOSIS — Z1389 Encounter for screening for other disorder: Secondary | ICD-10-CM | POA: Diagnosis not present

## 2016-09-24 DIAGNOSIS — R7301 Impaired fasting glucose: Secondary | ICD-10-CM | POA: Diagnosis not present

## 2016-09-24 DIAGNOSIS — E559 Vitamin D deficiency, unspecified: Secondary | ICD-10-CM | POA: Diagnosis not present

## 2016-09-24 DIAGNOSIS — I509 Heart failure, unspecified: Secondary | ICD-10-CM | POA: Diagnosis not present

## 2016-11-05 DIAGNOSIS — R0781 Pleurodynia: Secondary | ICD-10-CM | POA: Diagnosis not present

## 2017-01-06 DIAGNOSIS — H9193 Unspecified hearing loss, bilateral: Secondary | ICD-10-CM | POA: Diagnosis not present

## 2017-01-06 DIAGNOSIS — Z6826 Body mass index (BMI) 26.0-26.9, adult: Secondary | ICD-10-CM | POA: Diagnosis not present

## 2017-01-06 DIAGNOSIS — H6123 Impacted cerumen, bilateral: Secondary | ICD-10-CM | POA: Diagnosis not present

## 2017-01-07 DIAGNOSIS — H6123 Impacted cerumen, bilateral: Secondary | ICD-10-CM | POA: Diagnosis not present

## 2017-01-28 DIAGNOSIS — I509 Heart failure, unspecified: Secondary | ICD-10-CM | POA: Diagnosis not present

## 2017-01-28 DIAGNOSIS — I2510 Atherosclerotic heart disease of native coronary artery without angina pectoris: Secondary | ICD-10-CM | POA: Diagnosis not present

## 2017-01-28 DIAGNOSIS — I10 Essential (primary) hypertension: Secondary | ICD-10-CM | POA: Diagnosis not present

## 2017-01-28 DIAGNOSIS — R0789 Other chest pain: Secondary | ICD-10-CM | POA: Diagnosis not present

## 2017-01-28 DIAGNOSIS — Z6826 Body mass index (BMI) 26.0-26.9, adult: Secondary | ICD-10-CM | POA: Diagnosis not present

## 2017-01-28 DIAGNOSIS — R062 Wheezing: Secondary | ICD-10-CM | POA: Diagnosis not present

## 2017-04-02 DIAGNOSIS — I10 Essential (primary) hypertension: Secondary | ICD-10-CM | POA: Diagnosis not present

## 2017-04-02 DIAGNOSIS — R82998 Other abnormal findings in urine: Secondary | ICD-10-CM | POA: Diagnosis not present

## 2017-04-02 DIAGNOSIS — R7301 Impaired fasting glucose: Secondary | ICD-10-CM | POA: Diagnosis not present

## 2017-04-02 DIAGNOSIS — E7849 Other hyperlipidemia: Secondary | ICD-10-CM | POA: Diagnosis not present

## 2017-04-02 DIAGNOSIS — M810 Age-related osteoporosis without current pathological fracture: Secondary | ICD-10-CM | POA: Diagnosis not present

## 2017-04-08 DIAGNOSIS — F418 Other specified anxiety disorders: Secondary | ICD-10-CM | POA: Diagnosis not present

## 2017-04-08 DIAGNOSIS — Z1389 Encounter for screening for other disorder: Secondary | ICD-10-CM | POA: Diagnosis not present

## 2017-04-08 DIAGNOSIS — R7301 Impaired fasting glucose: Secondary | ICD-10-CM | POA: Diagnosis not present

## 2017-04-08 DIAGNOSIS — R251 Tremor, unspecified: Secondary | ICD-10-CM | POA: Diagnosis not present

## 2017-04-08 DIAGNOSIS — R42 Dizziness and giddiness: Secondary | ICD-10-CM | POA: Diagnosis not present

## 2017-04-08 DIAGNOSIS — D649 Anemia, unspecified: Secondary | ICD-10-CM | POA: Diagnosis not present

## 2017-04-08 DIAGNOSIS — I509 Heart failure, unspecified: Secondary | ICD-10-CM | POA: Diagnosis not present

## 2017-04-08 DIAGNOSIS — Z0000 Encounter for general adult medical examination without abnormal findings: Secondary | ICD-10-CM | POA: Diagnosis not present

## 2017-04-08 DIAGNOSIS — Z6826 Body mass index (BMI) 26.0-26.9, adult: Secondary | ICD-10-CM | POA: Diagnosis not present

## 2017-04-08 DIAGNOSIS — M810 Age-related osteoporosis without current pathological fracture: Secondary | ICD-10-CM | POA: Diagnosis not present

## 2017-04-08 DIAGNOSIS — I10 Essential (primary) hypertension: Secondary | ICD-10-CM | POA: Diagnosis not present

## 2017-04-08 DIAGNOSIS — Z23 Encounter for immunization: Secondary | ICD-10-CM | POA: Diagnosis not present

## 2017-04-10 DIAGNOSIS — Z1212 Encounter for screening for malignant neoplasm of rectum: Secondary | ICD-10-CM | POA: Diagnosis not present

## 2017-08-21 DIAGNOSIS — H52203 Unspecified astigmatism, bilateral: Secondary | ICD-10-CM | POA: Diagnosis not present

## 2017-08-21 DIAGNOSIS — Z961 Presence of intraocular lens: Secondary | ICD-10-CM | POA: Diagnosis not present

## 2017-08-21 DIAGNOSIS — H43813 Vitreous degeneration, bilateral: Secondary | ICD-10-CM | POA: Diagnosis not present

## 2017-08-21 DIAGNOSIS — H353133 Nonexudative age-related macular degeneration, bilateral, advanced atrophic without subfoveal involvement: Secondary | ICD-10-CM | POA: Diagnosis not present

## 2017-08-28 ENCOUNTER — Ambulatory Visit (INDEPENDENT_AMBULATORY_CARE_PROVIDER_SITE_OTHER): Payer: Medicare Other | Admitting: Interventional Cardiology

## 2017-08-28 ENCOUNTER — Encounter: Payer: Self-pay | Admitting: Interventional Cardiology

## 2017-08-28 VITALS — BP 142/74 | HR 89 | Ht 63.0 in | Wt 146.2 lb

## 2017-08-28 DIAGNOSIS — I428 Other cardiomyopathies: Secondary | ICD-10-CM | POA: Diagnosis not present

## 2017-08-28 DIAGNOSIS — I5032 Chronic diastolic (congestive) heart failure: Secondary | ICD-10-CM | POA: Diagnosis not present

## 2017-08-28 DIAGNOSIS — G250 Essential tremor: Secondary | ICD-10-CM | POA: Diagnosis not present

## 2017-08-28 DIAGNOSIS — I10 Essential (primary) hypertension: Secondary | ICD-10-CM | POA: Diagnosis not present

## 2017-08-28 NOTE — Patient Instructions (Signed)

## 2017-08-28 NOTE — Progress Notes (Signed)
Cardiology Office Note:    Date:  08/28/2017   ID:  Jennifer Rocha, DOB [birth year], MRN [id number]  PCP:  Reynold Bowen, MD  Cardiologist:  No primary care provider on file.   Referring MD: Reynold Bowen, MD   Chief Complaint  Patient presents with  . Congestive Heart Failure    History of Present Illness:    Jennifer Rocha is a 84 y.o. female with a hx of  who presents for nonischemic cardiomyopathy. We demonstrated reversal of low EF to normal EF.  Jennifer Rocha has no complaints.  She did recently injure her back and is in a fair amount of discomfort today.  Otherwise there are no complaints.  She specifically denies orthopnea, PND, chest pain, and edema.  She is ambulating without limitation other than her back.  No medication side effects.  No neurological complaints.   Past Medical History:  Diagnosis Date  . Ethanol causing toxic effect ([place])   . HLD (hyperlipidemia)   . Hypertension   . Left heart failure ([place])   . Non-ischemic cardiomyopathy ([place])    with EF 20% improving to 50%    Past Surgical History:  Procedure Laterality Date  . ANKLE SURGERY Right   . APPENDECTOMY  1941  . TONSILLECTOMY      Current Medications: Current Meds  Medication Sig  . aspirin EC 81 MG tablet Take 81 mg by mouth daily.  Marland Kitchen atorvastatin (LIPITOR) 40 MG tablet Take 1 tablet by mouth daily.  . carvedilol (COREG) 6.25 MG tablet Take 1 tablet by mouth 2 (two) times daily.   . Cholecalciferol (VITAMIN D3) 50000 units CAPS Take 50,000 Units by mouth once a week.  . DULoxetine (CYMBALTA) 30 MG capsule Take 1 capsule by mouth daily.   Marland Kitchen losartan (COZAAR) 50 MG tablet Take 50 mg by mouth daily.  . Multiple Vitamins-Minerals (ICAPS MV) TABS Take 2 tablets by mouth at bedtime.   . Polysaccharide Iron Complex (POLY-IRON 150 PO) Take 1 tablet by mouth 2 (two) times daily.     Allergies:   Demerol [meperidine] and Novocain [procaine]   Social History   Socioeconomic History  . Marital  status: Widowed    Spouse name: Not on file  . Number of children: 3  . Years of education: 101  . Highest education level: Not on file  Occupational History  . Occupation: Retired   Scientific laboratory technician  . Financial resource strain: Not on file  . Food insecurity:    Worry: Not on file    Inability: Not on file  . Transportation needs:    Medical: Not on file    Non-medical: Not on file  Tobacco Use  . Smoking status: Former Smoker    Last attempt to quit: 05/27/1953    Years since quitting: 64.2  . Smokeless tobacco: Never Used  Substance and Sexual Activity  . Alcohol use: Yes    Alcohol/week: 12.6 oz    Types: 21 Glasses of wine per week    Comment: 3 glasses per day  . Drug use: No  . Sexual activity: Not on file  Lifestyle  . Physical activity:    Days per week: Not on file    Minutes per session: Not on file  . Stress: Not on file  Relationships  . Social connections:    Talks on phone: Not on file    Gets together: Not on file    Attends religious service: Not on file    Active  member of club or organization: Not on file    Attends meetings of clubs or organizations: Not on file    Relationship status: Not on file  Other Topics Concern  . Not on file  Social History Narrative   Lives at home by herself   1-2 cups coffee per day     Family History: [name] family history includes Heart disease in her brother; Heart failure in her mother. She was adopted.  ROS:   Please see the history of present illness.    None All other systems reviewed and are negative.  EKGs/Labs/Other Studies Reviewed:    The following studies were reviewed today: None  EKG:  EKG is is ordered today.  The ekg ordered today demonstrates normal sinus rhythm with overall normal appearance.  No change compared to prior.  Recent Labs: No results found for requested labs within last 8760 hours.  Recent Lipid Panel No results found for: CHOL, TRIG, HDL, CHOLHDL, VLDL, LDLCALC,  LDLDIRECT  Physical Exam:    VS:  BP (!) 142/74   Pulse 89   Ht 5\' 3"  (1.6 m)   Wt 146 lb 3.2 oz (66.3 kg)   SpO2 91%   BMI 25.90 kg/m     Wt Readings from Last 3 Encounters:  08/28/17 146 lb 3.2 oz (66.3 kg)  09/03/16 144 lb 9.6 oz (65.6 kg)  04/04/16 149 lb 2 oz (67.6 kg)     GEN: Elderly but not frail.  Well nourished, well developed in no acute distress HEENT: Normal NECK: No JVD; No carotid bruits LYMPHATICS: No lymphadenopathy CARDIAC: RRR, no murmurs, rubs, gallops RESPIRATORY:  Clear to auscultation without rales, wheezing or rhonchi  ABDOMEN: Soft, non-tender, non-distended MUSCULOSKELETAL:  No edema; No deformity  SKIN: Warm and dry NEUROLOGIC:  Alert and oriented x 3 PSYCHIATRIC:  Normal affect   ASSESSMENT:    1. Chronic diastolic heart failure ([place])   2. Benign essential tremor   3. Essential hypertension   4. Non-ischemic cardiomyopathy ([place])    PLAN:    In order of problems listed above:  1. Stable without volume overload.  Continue current medical regimen. 2. Continue beta-blocker and RAAS therapy. 3. Continue to drug therapy for hypertension.   Clinical follow-up in 1 year.  No change in therapy.   Medication Adjustments/Labs and Tests Ordered: Current medicines are reviewed at length with the patient today.  Concerns regarding medicines are outlined above.  Orders Placed This Encounter  Procedures  . EKG 12-Lead   No orders of the defined types were placed in this encounter.   Signed, Sinclair Grooms, MD  08/28/2017 6:17 PM    [place]

## 2017-09-09 DIAGNOSIS — E7849 Other hyperlipidemia: Secondary | ICD-10-CM | POA: Diagnosis not present

## 2017-09-09 DIAGNOSIS — I509 Heart failure, unspecified: Secondary | ICD-10-CM | POA: Diagnosis not present

## 2017-09-09 DIAGNOSIS — D649 Anemia, unspecified: Secondary | ICD-10-CM | POA: Diagnosis not present

## 2017-09-09 DIAGNOSIS — I10 Essential (primary) hypertension: Secondary | ICD-10-CM | POA: Diagnosis not present

## 2017-09-09 DIAGNOSIS — H3530 Unspecified macular degeneration: Secondary | ICD-10-CM | POA: Diagnosis not present

## 2017-09-09 DIAGNOSIS — R7301 Impaired fasting glucose: Secondary | ICD-10-CM | POA: Diagnosis not present

## 2017-09-09 DIAGNOSIS — Z6826 Body mass index (BMI) 26.0-26.9, adult: Secondary | ICD-10-CM | POA: Diagnosis not present

## 2018-02-23 DIAGNOSIS — Z961 Presence of intraocular lens: Secondary | ICD-10-CM | POA: Diagnosis not present

## 2018-02-23 DIAGNOSIS — H353133 Nonexudative age-related macular degeneration, bilateral, advanced atrophic without subfoveal involvement: Secondary | ICD-10-CM | POA: Diagnosis not present

## 2018-02-23 DIAGNOSIS — H5315 Visual distortions of shape and size: Secondary | ICD-10-CM | POA: Diagnosis not present

## 2018-04-07 DIAGNOSIS — R82998 Other abnormal findings in urine: Secondary | ICD-10-CM | POA: Diagnosis not present

## 2018-04-07 DIAGNOSIS — R7301 Impaired fasting glucose: Secondary | ICD-10-CM | POA: Diagnosis not present

## 2018-04-07 DIAGNOSIS — E559 Vitamin D deficiency, unspecified: Secondary | ICD-10-CM | POA: Diagnosis not present

## 2018-04-07 DIAGNOSIS — E7849 Other hyperlipidemia: Secondary | ICD-10-CM | POA: Diagnosis not present

## 2018-04-07 DIAGNOSIS — I10 Essential (primary) hypertension: Secondary | ICD-10-CM | POA: Diagnosis not present

## 2018-04-14 DIAGNOSIS — Z1389 Encounter for screening for other disorder: Secondary | ICD-10-CM | POA: Diagnosis not present

## 2018-04-14 DIAGNOSIS — E559 Vitamin D deficiency, unspecified: Secondary | ICD-10-CM | POA: Diagnosis not present

## 2018-04-14 DIAGNOSIS — I5032 Chronic diastolic (congestive) heart failure: Secondary | ICD-10-CM | POA: Diagnosis not present

## 2018-04-14 DIAGNOSIS — Z6826 Body mass index (BMI) 26.0-26.9, adult: Secondary | ICD-10-CM | POA: Diagnosis not present

## 2018-04-14 DIAGNOSIS — R7301 Impaired fasting glucose: Secondary | ICD-10-CM | POA: Diagnosis not present

## 2018-04-14 DIAGNOSIS — Z0000 Encounter for general adult medical examination without abnormal findings: Secondary | ICD-10-CM | POA: Diagnosis not present

## 2018-04-14 DIAGNOSIS — Z23 Encounter for immunization: Secondary | ICD-10-CM | POA: Diagnosis not present

## 2018-04-14 DIAGNOSIS — E7849 Other hyperlipidemia: Secondary | ICD-10-CM | POA: Diagnosis not present

## 2018-04-14 DIAGNOSIS — I10 Essential (primary) hypertension: Secondary | ICD-10-CM | POA: Diagnosis not present

## 2018-04-14 DIAGNOSIS — F418 Other specified anxiety disorders: Secondary | ICD-10-CM | POA: Diagnosis not present

## 2018-04-14 DIAGNOSIS — M810 Age-related osteoporosis without current pathological fracture: Secondary | ICD-10-CM | POA: Diagnosis not present

## 2018-04-15 DIAGNOSIS — Z1212 Encounter for screening for malignant neoplasm of rectum: Secondary | ICD-10-CM | POA: Diagnosis not present

## 2018-04-21 DIAGNOSIS — M545 Low back pain: Secondary | ICD-10-CM | POA: Diagnosis not present

## 2018-04-21 DIAGNOSIS — Z6826 Body mass index (BMI) 26.0-26.9, adult: Secondary | ICD-10-CM | POA: Diagnosis not present

## 2018-04-21 DIAGNOSIS — M859 Disorder of bone density and structure, unspecified: Secondary | ICD-10-CM | POA: Diagnosis not present

## 2018-04-21 DIAGNOSIS — I10 Essential (primary) hypertension: Secondary | ICD-10-CM | POA: Diagnosis not present

## 2018-06-12 DIAGNOSIS — H9193 Unspecified hearing loss, bilateral: Secondary | ICD-10-CM | POA: Diagnosis not present

## 2018-06-12 DIAGNOSIS — H6123 Impacted cerumen, bilateral: Secondary | ICD-10-CM | POA: Diagnosis not present

## 2018-06-12 DIAGNOSIS — Z6826 Body mass index (BMI) 26.0-26.9, adult: Secondary | ICD-10-CM | POA: Diagnosis not present

## 2018-06-15 DIAGNOSIS — Z6826 Body mass index (BMI) 26.0-26.9, adult: Secondary | ICD-10-CM | POA: Diagnosis not present

## 2018-06-15 DIAGNOSIS — H9193 Unspecified hearing loss, bilateral: Secondary | ICD-10-CM | POA: Diagnosis not present

## 2018-06-15 DIAGNOSIS — H6123 Impacted cerumen, bilateral: Secondary | ICD-10-CM | POA: Diagnosis not present

## 2018-06-22 DIAGNOSIS — Z961 Presence of intraocular lens: Secondary | ICD-10-CM | POA: Diagnosis not present

## 2018-06-22 DIAGNOSIS — H538 Other visual disturbances: Secondary | ICD-10-CM | POA: Diagnosis not present

## 2018-06-22 DIAGNOSIS — H353133 Nonexudative age-related macular degeneration, bilateral, advanced atrophic without subfoveal involvement: Secondary | ICD-10-CM | POA: Diagnosis not present

## 2018-08-25 ENCOUNTER — Telehealth: Payer: Self-pay | Admitting: Interventional Cardiology

## 2018-08-25 NOTE — Telephone Encounter (Signed)
Attempted to contact pt in regards to appt scheduled for 08/31/2018 with Dr. Tamala Julian.  Phone rang several times and then call disconnected.  Will try again later.  If pt doing ok will move appt out to July or Aug.

## 2018-08-27 NOTE — Telephone Encounter (Signed)
Due to COVID-19 precautions, she should reschedule for this office visit for 3 to 6 months.  If she is having symptoms, it would be okay to set up a virtual visit, either telephone or video.

## 2018-08-27 NOTE — Telephone Encounter (Signed)
Attempted pt again.  Phone rang several time and then disconnected.  Spoke with daughter, DPR on file.  She will contact pt and have her contact me.

## 2018-08-28 NOTE — Telephone Encounter (Signed)
Left message to call back  

## 2018-08-28 NOTE — Telephone Encounter (Signed)
Pt called back and rescheduled to August 

## 2018-08-31 ENCOUNTER — Ambulatory Visit: Payer: Medicare Other | Admitting: Interventional Cardiology

## 2018-10-13 DIAGNOSIS — H3530 Unspecified macular degeneration: Secondary | ICD-10-CM | POA: Diagnosis not present

## 2018-10-13 DIAGNOSIS — R251 Tremor, unspecified: Secondary | ICD-10-CM | POA: Diagnosis not present

## 2018-10-13 DIAGNOSIS — E785 Hyperlipidemia, unspecified: Secondary | ICD-10-CM | POA: Diagnosis not present

## 2018-10-13 DIAGNOSIS — Z1331 Encounter for screening for depression: Secondary | ICD-10-CM | POA: Diagnosis not present

## 2018-10-13 DIAGNOSIS — I2510 Atherosclerotic heart disease of native coronary artery without angina pectoris: Secondary | ICD-10-CM | POA: Diagnosis not present

## 2018-10-13 DIAGNOSIS — M810 Age-related osteoporosis without current pathological fracture: Secondary | ICD-10-CM | POA: Diagnosis not present

## 2018-10-13 DIAGNOSIS — E559 Vitamin D deficiency, unspecified: Secondary | ICD-10-CM | POA: Diagnosis not present

## 2018-10-13 DIAGNOSIS — I5032 Chronic diastolic (congestive) heart failure: Secondary | ICD-10-CM | POA: Diagnosis not present

## 2018-10-13 DIAGNOSIS — R7301 Impaired fasting glucose: Secondary | ICD-10-CM | POA: Diagnosis not present

## 2018-10-13 DIAGNOSIS — H9193 Unspecified hearing loss, bilateral: Secondary | ICD-10-CM | POA: Diagnosis not present

## 2018-10-13 DIAGNOSIS — I110 Hypertensive heart disease with heart failure: Secondary | ICD-10-CM | POA: Diagnosis not present

## 2018-10-13 DIAGNOSIS — D649 Anemia, unspecified: Secondary | ICD-10-CM | POA: Diagnosis not present

## 2018-10-13 DIAGNOSIS — F418 Other specified anxiety disorders: Secondary | ICD-10-CM | POA: Diagnosis not present

## 2018-10-20 DIAGNOSIS — M25552 Pain in left hip: Secondary | ICD-10-CM | POA: Diagnosis not present

## 2018-11-17 ENCOUNTER — Telehealth: Payer: Self-pay | Admitting: Interventional Cardiology

## 2018-11-17 NOTE — Telephone Encounter (Signed)
F/U Message               Patient is calling back to discuss appointment options, pls call again to advise.

## 2018-11-17 NOTE — Telephone Encounter (Signed)
Attempted to call pt back.  No answer and VM did not pick up.

## 2018-11-18 NOTE — Progress Notes (Signed)
Cardiology Office Note:    Date:  11/19/2018   ID:  Jennifer Rocha, DOB Apr 29, 1928, MRN [id number]  PCP:  Reynold Bowen, MD  Cardiologist:  Sinclair Grooms, MD   Referring MD: Reynold Bowen, MD   Chief Complaint  Patient presents with  . Congestive Heart Failure    History of Present Illness:    Jennifer Rocha is a 85 y.o. female with a hx of hyperlipidemia, essential hypertension, nonischemic cardiomyopathy with HFrEF--> HFpEF with most recent EF 54% by nuclear wall motion study.  She is taking precautions during the [place] pandemic.  She has no complaints from cardiac standpoint.  Specifically, no orthopnea, PND, ankle edema, dyspnea on exertion, angina, or palpitations.  Appetite is been good.  No weight gain.  Past Medical History:  Diagnosis Date  . 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 HLD (hyperlipidemia)   . Hypertension   . Left heart failure ([place])   . Non-ischemic cardiomyopathy ([place])    with EF 20% improving to 50%    Past Surgical History:  Procedure Laterality Date  . ANKLE SURGERY Right   . APPENDECTOMY  1941  . TONSILLECTOMY      Current Medications: Current Meds  Medication Sig  . aspirin EC 81 MG tablet Take 81 mg by mouth daily.  Marland Kitchen atorvastatin (LIPITOR) 40 MG tablet Take 1 tablet by mouth daily.  . carvedilol (COREG) 6.25 MG tablet Take 1 tablet by mouth 2 (two) times daily.   . Cholecalciferol (VITAMIN D3) 50000 units CAPS Take 50,000 Units by mouth once a week.  . DULoxetine (CYMBALTA) 30 MG capsule Take 1 capsule by mouth daily.   Marland Kitchen losartan (COZAAR) 50 MG tablet Take 50 mg by mouth daily.  . Multiple Vitamins-Minerals (ICAPS MV) TABS Take 2 tablets by mouth at bedtime.   . Polysaccharide Iron Complex (POLY-IRON 150 PO) Take 1 tablet by mouth 2 (two) times daily.     Allergies:   Demerol [meperidine] and Novocain [procaine]   Social History   Socioeconomic History  . Marital status: Widowed    Spouse name: Not on file  . Number of  children: 3  . Years of education: 68  . Highest education level: Not on file  Occupational History  . Occupation: Retired   Scientific laboratory technician  . Financial resource strain: Not on file  . Food insecurity    Worry: Not on file    Inability: Not on file  . Transportation needs    Medical: Not on file    Non-medical: Not on file  Tobacco Use  . Smoking status: Former Smoker    Quit date: 05/27/1953    Years since quitting: 65.5  . Smokeless tobacco: Never Used  Substance and Sexual Activity  . Alcohol use: Yes    Alcohol/week: 21.0 standard drinks    Types: 21 Glasses of wine per week    Comment: 3 glasses per day  . Drug use: No  . Sexual activity: Not on file  Lifestyle  . Physical activity    Days per week: Not on file    Minutes per session: Not on file  . Stress: Not on file  Relationships  . Social Herbalist on phone: Not on file    Gets together: Not on file    Attends religious service: Not on file    Active member of club or organization: Not on file    Attends meetings of clubs or organizations: Not  on file    Relationship status: Not on file  Other Topics Concern  . Not on file  Social History Narrative   Lives at home by herself   1-2 cups coffee per day     Family History: [name] family history includes Heart disease in her brother; Heart failure in her mother. She was adopted.  ROS:   Please see the history of present illness.    Some depression related to social distancing all other systems reviewed and are negative.  EKGs/Labs/Other Studies Reviewed:    The following studies were reviewed today: No new functional data  EKG:  EKG sinus rhythm, first-degree AV block, nonspecific ST segment flattening, otherwise normal.  Recent Labs: No results found for requested labs within last 8760 hours.  Recent Lipid Panel No results found for: CHOL, TRIG, HDL, CHOLHDL, VLDL, LDLCALC, LDLDIRECT  Physical Exam:    VS:  BP 134/74   Pulse 80    Ht 5\' 3"  (1.6 m)   Wt 141 lb 12.8 oz (64.3 kg)   SpO2 95%   BMI 25.12 kg/m     Wt Readings from Last 3 Encounters:  11/19/18 141 lb 12.8 oz (64.3 kg)  08/28/17 146 lb 3.2 oz (66.3 kg)  09/03/16 144 lb 9.6 oz (65.6 kg)     GEN: Fully. No acute distress HEENT: Normal NECK: No JVD. LYMPHATICS: No lymphadenopathy CARDIAC: RRR.  Scratchy right upper sternal border systolic but no diastolic murmur, no gallop, no edema VASCULAR: 2+ bilateral radial pulses, no bruits RESPIRATORY:  Clear to auscultation without rales, wheezing or rhonchi  ABDOMEN: Soft, non-tender, non-distended, No pulsatile mass, MUSCULOSKELETAL: No deformity  SKIN: Warm and dry NEUROLOGIC:  Alert and oriented x 3 PSYCHIATRIC:  Normal affect   ASSESSMENT:    1. Chronic diastolic heart failure ([place])   2. Non-ischemic cardiomyopathy ([place])   3. Essential hypertension   4. Hyperlipidemia LDL goal <100   5. Benign essential tremor   6. Educated About Covid-19 Virus Infection    PLAN:    In order of problems listed above:  1. Stable without volume overload or symptoms.  Continue guideline directed therapy that improved low EF to preserved EF heart failure.  Laboratory data done earlier this year was satisfactory. 2. Please see above discussion. 3. We need to keep an eye on blood pressure.  Low-sodium diet and avoidance of nonsteroidals was discussed. 4. Lipid level was reasonable done in November 2019 with LDL 92. 5. Not discussed  Letter aerobic activity.  6 hours of sleep per night.  Notify us if difficulty with appetite, shortness of breath, or swelling.   Medication Adjustments/Labs and Tests Ordered: Current medicines are reviewed at length with the patient today.  Concerns regarding medicines are outlined above.  Orders Placed This Encounter  Procedures  . EKG 12-Lead   No orders of the defined types were placed in this encounter.   Patient Instructions  Medication Instructions:  Your physician  recommends that you continue on your current medications as directed. Please refer to the Current Medication list given to you today.  If you need a refill on your cardiac medications before your next appointment, please call your pharmacy.   Lab work: None If you have labs (blood work) drawn today and your tests are completely normal, you will receive your results only by: Marland Kitchen MyChart Message (if you have MyChart) OR . A paper copy in the mail If you have any lab test that is abnormal or we  need to change your treatment, we will call you to review the results.  Testing/Procedures: None  Follow-Up: At [facility], you and your health needs are our priority.  As part of our continuing mission to provide you with exceptional heart care, we have created designated Provider Care Teams.  These Care Teams include your primary Cardiologist (physician) and Advanced Practice Providers (APPs -  Physician Assistants and Nurse Practitioners) who all work together to provide you with the care you need, when you need it. You will need a follow up appointment in 12 months.  Please call our office 2 months in advance to schedule this appointment.  You may see Sinclair Grooms, MD or one of the following Advanced Practice Providers on your designated Care Team:   Truitt Merle, NP Cecilie Kicks, NP . Kathyrn Drown, NP  Any Other Special Instructions Will Be Listed Below (If Applicable).       Signed, Sinclair Grooms, MD  11/19/2018 12:23 PM    [place]

## 2018-11-18 NOTE — Telephone Encounter (Signed)
Spoke with pt and she was agreeable to come tomorrow for an appt.  Screened for Covid and advised to wear mask and come alone.  Pt in agreement with plan.      COVID-19 Pre-Screening Questions:  . In the past 7 to 10 days have you had a cough,  shortness of breath, headache, congestion, fever (100 or greater) body aches, chills, sore throat, or sudden loss of taste or sense of smell? No . Have you been around anyone with known Covid 19? No . Have you been around anyone who is awaiting Covid 19 test results in the past 7 to 10 days? No . Have you been around anyone who has been exposed to Covid 19, or has mentioned symptoms of Covid 19 within the past 7 to 10 days? No  If you have any concerns/questions about symptoms patients report during screening (either on the phone or at threshold). Contact the provider seeing the patient or DOD for further guidance.  If neither are available contact a member of the leadership team.

## 2018-11-19 ENCOUNTER — Encounter (INDEPENDENT_AMBULATORY_CARE_PROVIDER_SITE_OTHER): Payer: Self-pay

## 2018-11-19 ENCOUNTER — Encounter: Payer: Self-pay | Admitting: Interventional Cardiology

## 2018-11-19 ENCOUNTER — Ambulatory Visit (INDEPENDENT_AMBULATORY_CARE_PROVIDER_SITE_OTHER): Payer: Medicare Other | Admitting: Interventional Cardiology

## 2018-11-19 ENCOUNTER — Other Ambulatory Visit: Payer: Self-pay

## 2018-11-19 VITALS — BP 134/74 | HR 80 | Ht 63.0 in | Wt 141.8 lb

## 2018-11-19 DIAGNOSIS — I10 Essential (primary) hypertension: Secondary | ICD-10-CM | POA: Diagnosis not present

## 2018-11-19 DIAGNOSIS — I5032 Chronic diastolic (congestive) heart failure: Secondary | ICD-10-CM

## 2018-11-19 DIAGNOSIS — I428 Other cardiomyopathies: Secondary | ICD-10-CM | POA: Diagnosis not present

## 2018-11-19 DIAGNOSIS — E785 Hyperlipidemia, unspecified: Secondary | ICD-10-CM

## 2018-11-19 DIAGNOSIS — Z7189 Other specified counseling: Secondary | ICD-10-CM | POA: Diagnosis not present

## 2018-11-19 DIAGNOSIS — G250 Essential tremor: Secondary | ICD-10-CM | POA: Diagnosis not present

## 2018-11-19 NOTE — Patient Instructions (Signed)

## 2018-12-02 DIAGNOSIS — H353133 Nonexudative age-related macular degeneration, bilateral, advanced atrophic without subfoveal involvement: Secondary | ICD-10-CM | POA: Diagnosis not present

## 2018-12-02 DIAGNOSIS — H43813 Vitreous degeneration, bilateral: Secondary | ICD-10-CM | POA: Diagnosis not present

## 2018-12-21 DIAGNOSIS — D23111 Other benign neoplasm of skin of right upper eyelid, including canthus: Secondary | ICD-10-CM | POA: Diagnosis not present

## 2018-12-21 DIAGNOSIS — H0100A Unspecified blepharitis right eye, upper and lower eyelids: Secondary | ICD-10-CM | POA: Diagnosis not present

## 2018-12-21 DIAGNOSIS — H04123 Dry eye syndrome of bilateral lacrimal glands: Secondary | ICD-10-CM | POA: Diagnosis not present

## 2018-12-21 DIAGNOSIS — H353133 Nonexudative age-related macular degeneration, bilateral, advanced atrophic without subfoveal involvement: Secondary | ICD-10-CM | POA: Diagnosis not present

## 2019-01-08 ENCOUNTER — Ambulatory Visit: Payer: Medicare Other | Admitting: Interventional Cardiology

## 2019-03-22 ENCOUNTER — Encounter (INDEPENDENT_AMBULATORY_CARE_PROVIDER_SITE_OTHER): Payer: Self-pay

## 2019-04-15 DIAGNOSIS — E7849 Other hyperlipidemia: Secondary | ICD-10-CM | POA: Diagnosis not present

## 2019-04-15 DIAGNOSIS — R7301 Impaired fasting glucose: Secondary | ICD-10-CM | POA: Diagnosis not present

## 2019-04-15 DIAGNOSIS — Z23 Encounter for immunization: Secondary | ICD-10-CM | POA: Diagnosis not present

## 2019-04-15 DIAGNOSIS — M859 Disorder of bone density and structure, unspecified: Secondary | ICD-10-CM | POA: Diagnosis not present

## 2019-04-19 DIAGNOSIS — F418 Other specified anxiety disorders: Secondary | ICD-10-CM | POA: Diagnosis not present

## 2019-04-19 DIAGNOSIS — H3530 Unspecified macular degeneration: Secondary | ICD-10-CM | POA: Diagnosis not present

## 2019-04-19 DIAGNOSIS — I5032 Chronic diastolic (congestive) heart failure: Secondary | ICD-10-CM | POA: Diagnosis not present

## 2019-04-19 DIAGNOSIS — D649 Anemia, unspecified: Secondary | ICD-10-CM | POA: Diagnosis not present

## 2019-04-19 DIAGNOSIS — E559 Vitamin D deficiency, unspecified: Secondary | ICD-10-CM | POA: Diagnosis not present

## 2019-04-19 DIAGNOSIS — R1310 Dysphagia, unspecified: Secondary | ICD-10-CM | POA: Diagnosis not present

## 2019-04-19 DIAGNOSIS — I10 Essential (primary) hypertension: Secondary | ICD-10-CM | POA: Diagnosis not present

## 2019-04-19 DIAGNOSIS — M810 Age-related osteoporosis without current pathological fracture: Secondary | ICD-10-CM | POA: Diagnosis not present

## 2019-04-19 DIAGNOSIS — Z0000 Encounter for general adult medical examination without abnormal findings: Secondary | ICD-10-CM | POA: Diagnosis not present

## 2019-04-19 DIAGNOSIS — R05 Cough: Secondary | ICD-10-CM | POA: Diagnosis not present

## 2019-04-19 DIAGNOSIS — E785 Hyperlipidemia, unspecified: Secondary | ICD-10-CM | POA: Diagnosis not present

## 2019-06-02 DIAGNOSIS — H43813 Vitreous degeneration, bilateral: Secondary | ICD-10-CM | POA: Diagnosis not present

## 2019-06-02 DIAGNOSIS — H353134 Nonexudative age-related macular degeneration, bilateral, advanced atrophic with subfoveal involvement: Secondary | ICD-10-CM | POA: Diagnosis not present

## 2019-06-16 ENCOUNTER — Ambulatory Visit: Payer: Medicare Other | Attending: Internal Medicine

## 2019-06-16 DIAGNOSIS — Z23 Encounter for immunization: Secondary | ICD-10-CM | POA: Diagnosis not present

## 2019-06-16 NOTE — Progress Notes (Signed)
   Covid-19 Vaccination Clinic  Name:  Jennifer Rocha    MRN: [id number] DOB: 07/14/1927  06/16/2019  [name] was observed post Covid-19 immunization for 15 minutes without incidence. She was provided with Vaccine Information Sheet and instruction to access the V-Safe system.   [name] was instructed to call 911 with any severe reactions post vaccine: Marland Kitchen Difficulty breathing  . Swelling of your face and throat  . A fast heartbeat  . A bad rash all over your body  . Dizziness and weakness    Immunizations Administered    Name Date Dose VIS Date Route   Pfizer COVID-19 Vaccine 06/16/2019  6:15 PM 0.3 mL 05/07/2019 Intramuscular   Manufacturer: [place]   Lot: [id number]   [place]: [id number]

## 2019-07-07 ENCOUNTER — Ambulatory Visit: Payer: Medicare Other | Attending: Internal Medicine

## 2019-07-07 DIAGNOSIS — Z23 Encounter for immunization: Secondary | ICD-10-CM | POA: Insufficient documentation

## 2019-07-07 NOTE — Progress Notes (Signed)
   Covid-19 Vaccination Clinic  Name:  Jennifer Rocha    MRN: [id number] DOB: [birth year]  07/07/2019  Jennifer Rocha was observed post Covid-19 immunization for 15 minutes without incidence. She was provided with Vaccine Information Sheet and instruction to access the V-Safe system.   Jennifer Rocha was instructed to call 911 with any severe reactions post vaccine: Marland Kitchen Difficulty breathing  . Swelling of your face and throat  . A fast heartbeat  . A bad rash all over your body  . Dizziness and weakness    Immunizations Administered    Name Date Dose VIS Date Route   Pfizer COVID-19 Vaccine 07/07/2019 12:34 PM 0.3 mL 05/07/2019 Intramuscular   Manufacturer: [place]   Lot: [id number]   [place]: [id number]

## 2019-07-23 ENCOUNTER — Ambulatory Visit: Payer: Medicare Other

## 2019-08-06 ENCOUNTER — Ambulatory Visit (INDEPENDENT_AMBULATORY_CARE_PROVIDER_SITE_OTHER): Payer: Medicare Other | Admitting: Otolaryngology

## 2019-08-06 ENCOUNTER — Other Ambulatory Visit: Payer: Self-pay

## 2019-08-06 VITALS — Temp 97.3°F

## 2019-08-06 DIAGNOSIS — H6123 Impacted cerumen, bilateral: Secondary | ICD-10-CM

## 2019-08-06 NOTE — Progress Notes (Signed)
HPI: Jennifer Rocha is a 86 y.o. female who presents for evaluation of cerumen buildup bilaterally.  She underwent a hearing test with [facility] recently that showed moderate bilateral sensorineural hearing loss with SRT's of 50 dB on the right and 40 dB on the left.  She had type a tympanograms bilaterally.  She wants to see about getting hearing aids and has wax buildup in her ears.  Is referred here to have them cleaned..  Past Medical History:  Diagnosis Date  . Ethanol causing toxic effect ([place])   . HLD (hyperlipidemia)   . Hypertension   . Left heart failure ([place])   . Non-ischemic cardiomyopathy ([place])    with EF 20% improving to 50%   Past Surgical History:  Procedure Laterality Date  . ANKLE SURGERY Right   . APPENDECTOMY  1941  . TONSILLECTOMY     Social History   Socioeconomic History  . Marital status: Widowed    Spouse name: Not on file  . Number of children: 3  . Years of education: 32  . Highest education level: Not on file  Occupational History  . Occupation: Retired   Tobacco Use  . Smoking status: Former Smoker    Quit date: 05/27/1953    Years since quitting: 66.2  . Smokeless tobacco: Never Used  Substance and Sexual Activity  . Alcohol use: Yes    Alcohol/week: 21.0 standard drinks    Types: 21 Glasses of wine per week    Comment: 3 glasses per day  . Drug use: No  . Sexual activity: Not on file  Other Topics Concern  . Not on file  Social History Narrative   Lives at home by herself   1-2 cups coffee per day   Social Determinants of Health   Financial Resource Strain:   . Difficulty of Paying Living Expenses:   Food Insecurity:   . Worried About Charity fundraiser in the Last Year:   . Arboriculturist in the Last Year:   Transportation Needs:   . Film/video editor (Medical):   Marland Kitchen Lack of Transportation (Non-Medical):   Physical Activity:   . Days of Exercise per Week:   . Minutes of Exercise per Session:   Stress:   . Feeling of  Stress :   Social Connections:   . Frequency of Communication with Friends and Family:   . Frequency of Social Gatherings with Friends and Family:   . Attends Religious Services:   . Active Member of Clubs or Organizations:   . Attends Archivist Meetings:   Marland Kitchen Marital Status:    Family History  Adopted: Yes  Problem Relation Age of Onset  . Heart failure Mother   . Heart disease Brother    Allergies  Allergen Reactions  . Demerol [Meperidine] Nausea And Vomiting  . Novocain [Procaine]    Prior to Admission medications   Medication Sig Start Date End Date Taking? Authorizing Provider  aspirin EC 81 MG tablet Take 81 mg by mouth daily.   Yes [provider]  atorvastatin (LIPITOR) 40 MG tablet Take 1 tablet by mouth daily. 05/28/13  Yes [provider]  carvedilol (COREG) 6.25 MG tablet Take 1 tablet by mouth 2 (two) times daily.  06/02/13  Yes [provider]  Cholecalciferol (VITAMIN D3) 50000 units CAPS Take 50,000 Units by mouth once a week. 07/01/17  Yes [provider]  DULoxetine (CYMBALTA) 30 MG capsule Take 1 capsule by mouth  daily.  05/06/13  Yes [provider]  losartan (COZAAR) 50 MG tablet Take 50 mg by mouth daily. 07/02/16  Yes [provider]  Multiple Vitamins-Minerals (ICAPS MV) TABS Take 2 tablets by mouth at bedtime.    Yes [provider]  Polysaccharide Iron Complex (POLY-IRON 150 PO) Take 1 tablet by mouth 2 (two) times daily.   Yes [provider]     Positive ROS: Otherwise negative  All other systems have been reviewed and were otherwise negative with the exception of those mentioned in the HPI and as above.  Physical Exam: Constitutional: Alert, well-appearing, no acute distress Ears: External ears without lesions or tenderness. Ear canals are on the smaller side.  She has a large amount of wax buildup on the right side a little bit of wax buildup in the left side.  This was  cleaned with curettes.  TMs were otherwise clear.. Nasal: External nose without lesions. Clear nasal passages Oral: Oropharynx clear. Neck: No palpable adenopathy or masses Respiratory: Breathing comfortably  Skin: No facial/neck lesions or rash noted.  Cerumen impaction removal  Date/Time: 08/06/2019 1:26 PM Performed by: Rozetta Nunnery, MD Authorized by: Rozetta Nunnery, MD   Consent:    Consent obtained:  Verbal   Consent given by:  Patient   Risks discussed:  Pain and bleeding Procedure details:    Location:  L ear and R ear   Procedure type: curette   Post-procedure details:    Inspection:  TM intact and canal normal   Hearing quality:  Improved   Patient tolerance of procedure:  Tolerated well, no immediate complications Comments:     TMs are clear bilaterally    Assessment: Bilateral cerumen impactions. Sensorineural hearing loss  Plan: She will follow-up with [facility] concerning obtaining hearing aids.  She will follow up here as needed.  Radene Journey, MD

## 2019-08-09 ENCOUNTER — Encounter (INDEPENDENT_AMBULATORY_CARE_PROVIDER_SITE_OTHER): Payer: Self-pay

## 2019-08-11 DIAGNOSIS — M545 Low back pain: Secondary | ICD-10-CM | POA: Diagnosis not present

## 2019-10-19 DIAGNOSIS — Z1389 Encounter for screening for other disorder: Secondary | ICD-10-CM | POA: Diagnosis not present

## 2019-10-19 DIAGNOSIS — I2510 Atherosclerotic heart disease of native coronary artery without angina pectoris: Secondary | ICD-10-CM | POA: Diagnosis not present

## 2019-10-19 DIAGNOSIS — I255 Ischemic cardiomyopathy: Secondary | ICD-10-CM | POA: Diagnosis not present

## 2019-10-19 DIAGNOSIS — E7849 Other hyperlipidemia: Secondary | ICD-10-CM | POA: Diagnosis not present

## 2019-10-19 DIAGNOSIS — M810 Age-related osteoporosis without current pathological fracture: Secondary | ICD-10-CM | POA: Diagnosis not present

## 2019-10-19 DIAGNOSIS — I10 Essential (primary) hypertension: Secondary | ICD-10-CM | POA: Diagnosis not present

## 2019-10-19 DIAGNOSIS — R7301 Impaired fasting glucose: Secondary | ICD-10-CM | POA: Diagnosis not present

## 2019-10-19 DIAGNOSIS — R251 Tremor, unspecified: Secondary | ICD-10-CM | POA: Diagnosis not present

## 2019-10-19 DIAGNOSIS — R82998 Other abnormal findings in urine: Secondary | ICD-10-CM | POA: Diagnosis not present

## 2019-10-19 DIAGNOSIS — H3530 Unspecified macular degeneration: Secondary | ICD-10-CM | POA: Diagnosis not present

## 2019-10-19 DIAGNOSIS — I5032 Chronic diastolic (congestive) heart failure: Secondary | ICD-10-CM | POA: Diagnosis not present

## 2019-10-19 DIAGNOSIS — F418 Other specified anxiety disorders: Secondary | ICD-10-CM | POA: Diagnosis not present

## 2019-10-27 DIAGNOSIS — L03818 Cellulitis of other sites: Secondary | ICD-10-CM | POA: Diagnosis not present

## 2019-12-07 ENCOUNTER — Emergency Department (HOSPITAL_COMMUNITY): Payer: Medicare Other

## 2019-12-07 ENCOUNTER — Inpatient Hospital Stay (HOSPITAL_COMMUNITY)
Admission: EM | Admit: 2019-12-07 | Discharge: 2019-12-15 | DRG: 064 | Disposition: A | Discharge status: Skilled Nursing Facility | Payer: Medicare Other | Attending: Internal Medicine | Admitting: Internal Medicine

## 2019-12-07 ENCOUNTER — Encounter (HOSPITAL_COMMUNITY): Payer: Self-pay | Admitting: Emergency Medicine

## 2019-12-07 ENCOUNTER — Other Ambulatory Visit: Payer: Self-pay

## 2019-12-07 DIAGNOSIS — R1312 Dysphagia, oropharyngeal phase: Secondary | ICD-10-CM | POA: Diagnosis not present

## 2019-12-07 DIAGNOSIS — S42291A Other displaced fracture of upper end of right humerus, initial encounter for closed fracture: Secondary | ICD-10-CM | POA: Diagnosis not present

## 2019-12-07 DIAGNOSIS — W1830XA Fall on same level, unspecified, initial encounter: Secondary | ICD-10-CM | POA: Diagnosis present

## 2019-12-07 DIAGNOSIS — Z87891 Personal history of nicotine dependence: Secondary | ICD-10-CM | POA: Diagnosis not present

## 2019-12-07 DIAGNOSIS — Y92009 Unspecified place in unspecified non-institutional (private) residence as the place of occurrence of the external cause: Secondary | ICD-10-CM

## 2019-12-07 DIAGNOSIS — F10239 Alcohol dependence with withdrawal, unspecified: Secondary | ICD-10-CM | POA: Diagnosis present

## 2019-12-07 DIAGNOSIS — I69351 Hemiplegia and hemiparesis following cerebral infarction affecting right dominant side: Secondary | ICD-10-CM | POA: Diagnosis not present

## 2019-12-07 DIAGNOSIS — I6503 Occlusion and stenosis of bilateral vertebral arteries: Secondary | ICD-10-CM | POA: Diagnosis not present

## 2019-12-07 DIAGNOSIS — I672 Cerebral atherosclerosis: Secondary | ICD-10-CM | POA: Diagnosis not present

## 2019-12-07 DIAGNOSIS — G9341 Metabolic encephalopathy: Secondary | ICD-10-CM | POA: Diagnosis not present

## 2019-12-07 DIAGNOSIS — S0083XA Contusion of other part of head, initial encounter: Secondary | ICD-10-CM | POA: Diagnosis not present

## 2019-12-07 DIAGNOSIS — G250 Essential tremor: Secondary | ICD-10-CM | POA: Diagnosis not present

## 2019-12-07 DIAGNOSIS — I639 Cerebral infarction, unspecified: Secondary | ICD-10-CM | POA: Diagnosis not present

## 2019-12-07 DIAGNOSIS — I6340 Cerebral infarction due to embolism of unspecified cerebral artery: Secondary | ICD-10-CM

## 2019-12-07 DIAGNOSIS — R627 Adult failure to thrive: Secondary | ICD-10-CM | POA: Diagnosis not present

## 2019-12-07 DIAGNOSIS — S0990XA Unspecified injury of head, initial encounter: Secondary | ICD-10-CM | POA: Diagnosis not present

## 2019-12-07 DIAGNOSIS — E876 Hypokalemia: Secondary | ICD-10-CM | POA: Diagnosis present

## 2019-12-07 DIAGNOSIS — S42211A Unspecified displaced fracture of surgical neck of right humerus, initial encounter for closed fracture: Secondary | ICD-10-CM | POA: Diagnosis not present

## 2019-12-07 DIAGNOSIS — W19XXXA Unspecified fall, initial encounter: Secondary | ICD-10-CM | POA: Diagnosis not present

## 2019-12-07 DIAGNOSIS — T796XXA Traumatic ischemia of muscle, initial encounter: Secondary | ICD-10-CM

## 2019-12-07 DIAGNOSIS — R410 Disorientation, unspecified: Secondary | ICD-10-CM | POA: Diagnosis not present

## 2019-12-07 DIAGNOSIS — D649 Anemia, unspecified: Secondary | ICD-10-CM | POA: Diagnosis not present

## 2019-12-07 DIAGNOSIS — Z20822 Contact with and (suspected) exposure to covid-19: Secondary | ICD-10-CM | POA: Diagnosis present

## 2019-12-07 DIAGNOSIS — I6521 Occlusion and stenosis of right carotid artery: Secondary | ICD-10-CM | POA: Diagnosis not present

## 2019-12-07 DIAGNOSIS — E785 Hyperlipidemia, unspecified: Secondary | ICD-10-CM | POA: Diagnosis present

## 2019-12-07 DIAGNOSIS — S066X0A Traumatic subarachnoid hemorrhage without loss of consciousness, initial encounter: Secondary | ICD-10-CM | POA: Diagnosis not present

## 2019-12-07 DIAGNOSIS — I10 Essential (primary) hypertension: Secondary | ICD-10-CM | POA: Diagnosis present

## 2019-12-07 DIAGNOSIS — S3993XA Unspecified injury of pelvis, initial encounter: Secondary | ICD-10-CM | POA: Diagnosis not present

## 2019-12-07 DIAGNOSIS — S199XXA Unspecified injury of neck, initial encounter: Secondary | ICD-10-CM | POA: Diagnosis not present

## 2019-12-07 DIAGNOSIS — I69391 Dysphagia following cerebral infarction: Secondary | ICD-10-CM | POA: Diagnosis not present

## 2019-12-07 DIAGNOSIS — R4182 Altered mental status, unspecified: Secondary | ICD-10-CM | POA: Diagnosis present

## 2019-12-07 DIAGNOSIS — Z515 Encounter for palliative care: Secondary | ICD-10-CM | POA: Diagnosis not present

## 2019-12-07 DIAGNOSIS — D539 Nutritional anemia, unspecified: Secondary | ICD-10-CM | POA: Diagnosis present

## 2019-12-07 DIAGNOSIS — I110 Hypertensive heart disease with heart failure: Secondary | ICD-10-CM | POA: Diagnosis present

## 2019-12-07 DIAGNOSIS — I615 Nontraumatic intracerebral hemorrhage, intraventricular: Secondary | ICD-10-CM | POA: Diagnosis present

## 2019-12-07 DIAGNOSIS — I428 Other cardiomyopathies: Secondary | ICD-10-CM | POA: Diagnosis present

## 2019-12-07 DIAGNOSIS — T796XXD Traumatic ischemia of muscle, subsequent encounter: Secondary | ICD-10-CM | POA: Diagnosis not present

## 2019-12-07 DIAGNOSIS — Z9049 Acquired absence of other specified parts of digestive tract: Secondary | ICD-10-CM | POA: Diagnosis not present

## 2019-12-07 DIAGNOSIS — Z7189 Other specified counseling: Secondary | ICD-10-CM | POA: Diagnosis not present

## 2019-12-07 DIAGNOSIS — D62 Acute posthemorrhagic anemia: Secondary | ICD-10-CM | POA: Diagnosis present

## 2019-12-07 DIAGNOSIS — F0390 Unspecified dementia without behavioral disturbance: Secondary | ICD-10-CM | POA: Diagnosis present

## 2019-12-07 DIAGNOSIS — I6601 Occlusion and stenosis of right middle cerebral artery: Secondary | ICD-10-CM | POA: Diagnosis not present

## 2019-12-07 DIAGNOSIS — R41841 Cognitive communication deficit: Secondary | ICD-10-CM | POA: Diagnosis not present

## 2019-12-07 DIAGNOSIS — M1612 Unilateral primary osteoarthritis, left hip: Secondary | ICD-10-CM | POA: Diagnosis not present

## 2019-12-07 DIAGNOSIS — I609 Nontraumatic subarachnoid hemorrhage, unspecified: Secondary | ICD-10-CM | POA: Diagnosis not present

## 2019-12-07 DIAGNOSIS — I5042 Chronic combined systolic (congestive) and diastolic (congestive) heart failure: Secondary | ICD-10-CM | POA: Diagnosis not present

## 2019-12-07 DIAGNOSIS — R0902 Hypoxemia: Secondary | ICD-10-CM | POA: Diagnosis not present

## 2019-12-07 DIAGNOSIS — R29713 NIHSS score 13: Secondary | ICD-10-CM | POA: Diagnosis present

## 2019-12-07 DIAGNOSIS — Z9181 History of falling: Secondary | ICD-10-CM | POA: Diagnosis not present

## 2019-12-07 DIAGNOSIS — Z7982 Long term (current) use of aspirin: Secondary | ICD-10-CM

## 2019-12-07 DIAGNOSIS — I959 Hypotension, unspecified: Secondary | ICD-10-CM | POA: Diagnosis not present

## 2019-12-07 DIAGNOSIS — Z79899 Other long term (current) drug therapy: Secondary | ICD-10-CM

## 2019-12-07 DIAGNOSIS — M6282 Rhabdomyolysis: Secondary | ICD-10-CM | POA: Diagnosis not present

## 2019-12-07 DIAGNOSIS — S299XXA Unspecified injury of thorax, initial encounter: Secondary | ICD-10-CM | POA: Diagnosis not present

## 2019-12-07 DIAGNOSIS — Z66 Do not resuscitate: Secondary | ICD-10-CM

## 2019-12-07 DIAGNOSIS — S42301D Unspecified fracture of shaft of humerus, right arm, subsequent encounter for fracture with routine healing: Secondary | ICD-10-CM | POA: Diagnosis not present

## 2019-12-07 DIAGNOSIS — F1020 Alcohol dependence, uncomplicated: Secondary | ICD-10-CM | POA: Diagnosis not present

## 2019-12-07 DIAGNOSIS — I739 Peripheral vascular disease, unspecified: Secondary | ICD-10-CM | POA: Diagnosis not present

## 2019-12-07 DIAGNOSIS — I63412 Cerebral infarction due to embolism of left middle cerebral artery: Secondary | ICD-10-CM | POA: Diagnosis present

## 2019-12-07 DIAGNOSIS — M2550 Pain in unspecified joint: Secondary | ICD-10-CM | POA: Diagnosis not present

## 2019-12-07 DIAGNOSIS — I69320 Aphasia following cerebral infarction: Secondary | ICD-10-CM | POA: Diagnosis not present

## 2019-12-07 DIAGNOSIS — R4701 Aphasia: Secondary | ICD-10-CM | POA: Diagnosis present

## 2019-12-07 DIAGNOSIS — Z7401 Bed confinement status: Secondary | ICD-10-CM | POA: Diagnosis not present

## 2019-12-07 DIAGNOSIS — G319 Degenerative disease of nervous system, unspecified: Secondary | ICD-10-CM | POA: Diagnosis not present

## 2019-12-07 DIAGNOSIS — R278 Other lack of coordination: Secondary | ICD-10-CM | POA: Diagnosis not present

## 2019-12-07 DIAGNOSIS — R531 Weakness: Secondary | ICD-10-CM | POA: Diagnosis not present

## 2019-12-07 DIAGNOSIS — M25511 Pain in right shoulder: Secondary | ICD-10-CM

## 2019-12-07 DIAGNOSIS — I6389 Other cerebral infarction: Secondary | ICD-10-CM | POA: Diagnosis not present

## 2019-12-07 DIAGNOSIS — R52 Pain, unspecified: Secondary | ICD-10-CM | POA: Diagnosis not present

## 2019-12-07 DIAGNOSIS — S99922A Unspecified injury of left foot, initial encounter: Secondary | ICD-10-CM | POA: Diagnosis not present

## 2019-12-07 DIAGNOSIS — G459 Transient cerebral ischemic attack, unspecified: Secondary | ICD-10-CM | POA: Diagnosis not present

## 2019-12-07 DIAGNOSIS — R2681 Unsteadiness on feet: Secondary | ICD-10-CM | POA: Diagnosis not present

## 2019-12-07 DIAGNOSIS — S42211G Unspecified displaced fracture of surgical neck of right humerus, subsequent encounter for fracture with delayed healing: Secondary | ICD-10-CM | POA: Diagnosis not present

## 2019-12-07 DIAGNOSIS — S3991XA Unspecified injury of abdomen, initial encounter: Secondary | ICD-10-CM | POA: Diagnosis not present

## 2019-12-07 DIAGNOSIS — S42214A Unspecified nondisplaced fracture of surgical neck of right humerus, initial encounter for closed fracture: Secondary | ICD-10-CM | POA: Diagnosis not present

## 2019-12-07 HISTORY — DX: Alcohol dependence, uncomplicated: F10.20

## 2019-12-07 LAB — URINALYSIS, ROUTINE W REFLEX MICROSCOPIC
Bilirubin Urine: NEGATIVE
Glucose, UA: NEGATIVE mg/dL
Hgb urine dipstick: NEGATIVE
Ketones, ur: 20 mg/dL — AB
Nitrite: NEGATIVE
Protein, ur: NEGATIVE mg/dL
Specific Gravity, Urine: 1.044 — ABNORMAL HIGH (ref 1.005–1.030)
pH: 5 (ref 5.0–8.0)

## 2019-12-07 LAB — ETHANOL: Alcohol, Ethyl (B): <10 mg/dL (ref <10)

## 2019-12-07 LAB — COMPREHENSIVE METABOLIC PANEL
ALT: 55 U/L — ABNORMAL HIGH (ref 0–44)
AST: 92 U/L — ABNORMAL HIGH (ref 15–41)
Albumin: 3.7 g/dL (ref 3.5–5.0)
Alkaline Phosphatase: 63 U/L (ref 38–126)
Anion gap: 14 (ref 5–15)
BUN: 23 mg/dL (ref 8–23)
CO2: 25 mmol/L (ref 22–32)
Calcium: 9.1 mg/dL (ref 8.9–10.3)
Chloride: 100 mmol/L (ref 98–111)
Creatinine, Ser: 0.56 mg/dL (ref 0.44–1.00)
GFR calc Af Amer: >60 mL/min (ref >60)
GFR calc non Af Amer: >60 mL/min (ref >60)
Glucose, Bld: 131 mg/dL — ABNORMAL HIGH (ref 70–99)
Potassium: 3.5 mmol/L (ref 3.5–5.1)
Sodium: 139 mmol/L (ref 135–145)
Total Bilirubin: 2.2 mg/dL — ABNORMAL HIGH (ref 0.3–1.2)
Total Protein: 6.5 g/dL (ref 6.5–8.1)

## 2019-12-07 LAB — CBC
HCT: 28.8 % — ABNORMAL LOW (ref 36.0–46.0)
Hemoglobin: 9.5 g/dL — ABNORMAL LOW (ref 12.0–15.0)
MCH: 33.5 pg (ref 26.0–34.0)
MCHC: 33 g/dL (ref 30.0–36.0)
MCV: 101.4 fL — ABNORMAL HIGH (ref 80.0–100.0)
Platelets: 246 10*3/uL (ref 150–400)
RBC: 2.84 MIL/uL — ABNORMAL LOW (ref 3.87–5.11)
RDW: 13.5 % (ref 11.5–15.5)
WBC: 12.4 10*3/uL — ABNORMAL HIGH (ref 4.0–10.5)
nRBC: 0.2 % (ref 0.0–0.2)

## 2019-12-07 LAB — I-STAT CHEM 8, ED
BUN: 25 mg/dL — ABNORMAL HIGH (ref 8–23)
Calcium, Ion: 1.11 mmol/L — ABNORMAL LOW (ref 1.15–1.40)
Chloride: 100 mmol/L (ref 98–111)
Creatinine, Ser: 0.4 mg/dL — ABNORMAL LOW (ref 0.44–1.00)
Glucose, Bld: 126 mg/dL — ABNORMAL HIGH (ref 70–99)
HCT: 30 % — ABNORMAL LOW (ref 36.0–46.0)
Hemoglobin: 10.2 g/dL — ABNORMAL LOW (ref 12.0–15.0)
Potassium: 3.5 mmol/L (ref 3.5–5.1)
Sodium: 139 mmol/L (ref 135–145)
TCO2: 25 mmol/L (ref 22–32)

## 2019-12-07 LAB — BASIC METABOLIC PANEL
Anion gap: 11 (ref 5–15)
BUN: 20 mg/dL (ref 8–23)
CO2: 25 mmol/L (ref 22–32)
Calcium: 8.4 mg/dL — ABNORMAL LOW (ref 8.9–10.3)
Chloride: 103 mmol/L (ref 98–111)
Creatinine, Ser: 0.5 mg/dL (ref 0.44–1.00)
GFR calc Af Amer: >60 mL/min (ref >60)
GFR calc non Af Amer: >60 mL/min (ref >60)
Glucose, Bld: 121 mg/dL — ABNORMAL HIGH (ref 70–99)
Potassium: 3.1 mmol/L — ABNORMAL LOW (ref 3.5–5.1)
Sodium: 139 mmol/L (ref 135–145)

## 2019-12-07 LAB — PROTIME-INR
INR: 1 (ref 0.8–1.2)
Prothrombin Time: 12.8 seconds (ref 11.4–15.2)

## 2019-12-07 LAB — SAMPLE TO BLOOD BANK

## 2019-12-07 LAB — SARS CORONAVIRUS 2 BY RT PCR (HOSPITAL ORDER, PERFORMED IN ~~LOC~~ HOSPITAL LAB): SARS Coronavirus 2: NEGATIVE

## 2019-12-07 LAB — CK: Total CK: 2943 U/L — ABNORMAL HIGH (ref 38–234)

## 2019-12-07 LAB — LACTIC ACID, PLASMA: Lactic Acid, Venous: 1.9 mmol/L (ref 0.5–1.9)

## 2019-12-07 MED ORDER — DOCUSATE SODIUM 100 MG PO CAPS
100.0000 mg | ORAL_CAPSULE | Freq: Two times a day (BID) | ORAL | Status: DC
  Administered 2019-12-07 – 2019-12-15 (×14): 100 mg via ORAL
  Filled 2019-12-07 (×16): qty 1

## 2019-12-07 MED ORDER — SODIUM CHLORIDE 0.9 % IV SOLN: INTRAVENOUS | Status: AC

## 2019-12-07 MED ORDER — LORAZEPAM 2 MG/ML IJ SOLN
0.5000 mg | Freq: Once | INTRAMUSCULAR | Status: AC
  Administered 2019-12-07: 0.5 mg via INTRAVENOUS

## 2019-12-07 MED ORDER — LORAZEPAM 2 MG/ML IJ SOLN
INTRAMUSCULAR | Status: AC
  Filled 2019-12-07: qty 1

## 2019-12-07 MED ORDER — ACETAMINOPHEN 325 MG PO TABS
650.0000 mg | ORAL_TABLET | Freq: Four times a day (QID) | ORAL | Status: DC | PRN
  Administered 2019-12-07 – 2019-12-08 (×2): 650 mg via ORAL
  Filled 2019-12-07 (×2): qty 2

## 2019-12-07 MED ORDER — SODIUM CHLORIDE 0.9 % IV BOLUS
1000.0000 mL | Freq: Once | INTRAVENOUS | Status: AC
  Administered 2019-12-07: 1000 mL via INTRAVENOUS

## 2019-12-07 MED ORDER — LORAZEPAM 1 MG PO TABS
1.0000 mg | ORAL_TABLET | ORAL | Status: DC | PRN
  Filled 2019-12-07: qty 1

## 2019-12-07 MED ORDER — ONDANSETRON HCL 4 MG/2ML IJ SOLN: 4.0000 mg | Freq: Four times a day (QID) | INTRAMUSCULAR | Status: DC | PRN

## 2019-12-07 MED ORDER — ADULT MULTIVITAMIN W/MINERALS CH
1.0000 | ORAL_TABLET | Freq: Every day | ORAL | Status: DC
  Administered 2019-12-07 – 2019-12-15 (×9): 1 via ORAL
  Filled 2019-12-07 (×9): qty 1

## 2019-12-07 MED ORDER — STERILE WATER FOR INJECTION IV SOLN
INTRAVENOUS | Status: DC
  Filled 2019-12-07: qty 850

## 2019-12-07 MED ORDER — IOHEXOL 300 MG/ML  SOLN
100.0000 mL | Freq: Once | INTRAMUSCULAR | Status: AC | PRN
  Administered 2019-12-07: 100 mL via INTRAVENOUS

## 2019-12-07 MED ORDER — ONDANSETRON HCL 4 MG PO TABS: 4.0000 mg | ORAL_TABLET | Freq: Four times a day (QID) | ORAL | Status: DC | PRN

## 2019-12-07 MED ORDER — DULOXETINE HCL 30 MG PO CPEP
30.0000 mg | ORAL_CAPSULE | Freq: Every day | ORAL | Status: DC
  Administered 2019-12-07 – 2019-12-15 (×9): 30 mg via ORAL
  Filled 2019-12-07 (×9): qty 1

## 2019-12-07 MED ORDER — FOLIC ACID 1 MG PO TABS
1.0000 mg | ORAL_TABLET | Freq: Every day | ORAL | Status: DC
  Administered 2019-12-07 – 2019-12-15 (×9): 1 mg via ORAL
  Filled 2019-12-07 (×9): qty 1

## 2019-12-07 MED ORDER — ATORVASTATIN CALCIUM 40 MG PO TABS
40.0000 mg | ORAL_TABLET | Freq: Every day | ORAL | Status: DC
  Administered 2019-12-07 – 2019-12-15 (×9): 40 mg via ORAL
  Filled 2019-12-07 (×5): qty 1
  Filled 2019-12-07: qty 4
  Filled 2019-12-07 (×3): qty 1

## 2019-12-07 MED ORDER — LORAZEPAM 2 MG/ML IJ SOLN
1.0000 mg | INTRAMUSCULAR | Status: DC | PRN
  Administered 2019-12-09: 2 mg via INTRAVENOUS
  Filled 2019-12-07: qty 1

## 2019-12-07 MED ORDER — MORPHINE SULFATE (PF) 2 MG/ML IV SOLN
2.0000 mg | INTRAVENOUS | Status: DC | PRN
  Administered 2019-12-08 – 2019-12-09 (×3): 2 mg via INTRAVENOUS
  Filled 2019-12-07 (×3): qty 1

## 2019-12-07 MED ORDER — ACETAMINOPHEN 650 MG RE SUPP: 650.0000 mg | Freq: Four times a day (QID) | RECTAL | Status: DC | PRN

## 2019-12-07 MED ORDER — THIAMINE HCL 100 MG PO TABS
100.0000 mg | ORAL_TABLET | Freq: Every day | ORAL | Status: DC
  Administered 2019-12-07 – 2019-12-15 (×9): 100 mg via ORAL
  Filled 2019-12-07 (×9): qty 1

## 2019-12-07 MED ORDER — THIAMINE HCL 100 MG/ML IJ SOLN
100.0000 mg | Freq: Every day | INTRAMUSCULAR | Status: DC
  Administered 2019-12-11: 100 mg via INTRAVENOUS
  Filled 2019-12-07 (×3): qty 2

## 2019-12-07 MED ORDER — BISACODYL 5 MG PO TBEC: 5.0000 mg | DELAYED_RELEASE_TABLET | Freq: Every day | ORAL | Status: DC | PRN

## 2019-12-07 MED ORDER — POLYETHYLENE GLYCOL 3350 17 G PO PACK: 17.0000 g | PACK | Freq: Every day | ORAL | Status: DC | PRN

## 2019-12-07 MED ORDER — CARVEDILOL 6.25 MG PO TABS
6.2500 mg | ORAL_TABLET | Freq: Two times a day (BID) | ORAL | Status: DC
  Administered 2019-12-07 – 2019-12-09 (×5): 6.25 mg via ORAL
  Filled 2019-12-07 (×5): qty 1

## 2019-12-07 MED ORDER — METHOCARBAMOL 1000 MG/10ML IJ SOLN
500.0000 mg | Freq: Four times a day (QID) | INTRAVENOUS | Status: DC | PRN
  Filled 2019-12-07 (×2): qty 5

## 2019-12-07 NOTE — Progress Notes (Signed)
Responded to level 2 fall.  Spoke with patient nurse and he indicated that patient was ok and Chaplain service not needed at the moment.  Will follow as needed.   Jaclynn Major, [place], [facility], Pager [phone]

## 2019-12-07 NOTE — Progress Notes (Signed)
Orthopedic Tech Progress Note Patient Details:  Jennifer Rocha 10-Jul-1927 [id number] Level 2 trauma Patient ID: Jennifer Rocha, female   DOB: 08/11/1927, 86 y.o.   MRN: [id number]   Janit Pagan 12/07/2019, 9:16 AM

## 2019-12-07 NOTE — ED Provider Notes (Signed)
[facility] EMERGENCY DEPARTMENT Provider Note   CSN: [id number] Arrival date & time: 12/07/19  3149     History Chief Complaint  Patient presents with  . Fall    Jennifer Rocha is a 86 y.o. female.  Pt presents to the ED today with a fall.  Pt said she fell on Saturday, July 10.  She said she stayed on the floor unable to get up because she was weak.  She does not know why she fell.  She was able to get ahold of her daughter today who called EMS.  Pt has not had anything to eat or drink since she fell.        Past Medical History:  Diagnosis Date  . 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 HLD (hyperlipidemia)   . Hypertension   . Left heart failure ([place])   . Non-ischemic cardiomyopathy ([place])    with EF 20% improving to 50%    Patient Active Problem List   Diagnosis Date Noted  . Benign essential tremor 01/27/2015  . Chronic diastolic heart failure ([place]) 08/23/2013  . 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 Non-ischemic cardiomyopathy ([place])   . Hypertension     Past Surgical History:  Procedure Laterality Date  . ANKLE SURGERY Right   . APPENDECTOMY  1941  . TONSILLECTOMY       OB History   No obstetric history on file.     Family History  Adopted: Yes  Problem Relation Age of Onset  . Heart failure Mother   . Heart disease Brother     Social History   Tobacco Use  . Smoking status: Former Smoker    Quit date: 05/27/1953    Years since quitting: 66.5  . Smokeless tobacco: Never Used  Substance Use Topics  . Alcohol use: Yes    Alcohol/week: 21.0 standard drinks    Types: 21 Glasses of wine per week    Comment: 3 glasses per day  . Drug use: No    Home Medications Prior to Admission medications   Medication Sig Start Date End Date Taking? Authorizing Provider  aspirin EC 81 MG tablet Take 81 mg by mouth daily.   Yes [provider]  atorvastatin (LIPITOR) 40 MG tablet Take 1 tablet by mouth daily. 05/28/13  Yes  [provider]  carvedilol (COREG) 6.25 MG tablet Take 1 tablet by mouth 2 (two) times daily.  06/02/13  Yes [provider]  Cholecalciferol (VITAMIN D3) 50000 units CAPS Take 50,000 Units by mouth See admin instructions. Every other week. 07/01/17  Yes [provider]  DULoxetine (CYMBALTA) 30 MG capsule Take 1 capsule by mouth daily.  05/06/13  Yes [provider]  losartan (COZAAR) 50 MG tablet Take 50 mg by mouth daily. 07/02/16  Yes [provider]  Multiple Vitamins-Minerals (ICAPS MV) TABS Take 2 tablets by mouth at bedtime.    Yes [provider]  Polysaccharide Iron Complex (POLY-IRON 150 PO) Take 1 tablet by mouth 2 (two) times daily.   Yes [provider]    Allergies    Demerol [meperidine] and Novocain [procaine]  Review of Systems   Review of Systems  Musculoskeletal:       Right arm pain Left foot pain  All other systems reviewed and are negative.   Physical Exam Updated Vital Signs BP (!) 129/58   Pulse (!) 107   Temp 98.2 F (36.8 C) (Oral)   Resp Marland Kitchen)  22   Ht 5\' 3"  (1.6 m)   Wt 65 kg   SpO2 97%   BMI 25.38 kg/m   Physical Exam Vitals and nursing note reviewed.  HENT:     Head: Normocephalic.     Comments: Bruising to left face    Right Ear: External ear normal.     Left Ear: External ear normal.     Nose: Nose normal.     Mouth/Throat:     Mouth: Mucous membranes are dry.  Eyes:     Extraocular Movements: Extraocular movements intact.     Conjunctiva/sclera: Conjunctivae normal.     Pupils: Pupils are equal, round, and reactive to light.  Neck:     Comments: Pt in c-collar Cardiovascular:     Rate and Rhythm: Normal rate and regular rhythm.     Pulses: Normal pulses.     Heart sounds: Normal heart sounds.  Pulmonary:     Effort: Pulmonary effort is normal.     Breath sounds: Normal breath sounds.  Abdominal:     General: Abdomen is flat. Bowel sounds are normal.     Palpations:  Abdomen is soft.  Musculoskeletal:       Arms:       Legs:     Comments: Large hematoma to right shoulder and arm.  Decreased rom right shoulder.   Skin:    Capillary Refill: Capillary refill takes less than 2 seconds.     Findings: Bruising present.  Neurological:     General: No focal deficit present.     Mental Status: She is alert and oriented to person, place, and time.  Psychiatric:        Mood and Affect: Mood normal.        Behavior: Behavior normal.     ED Results / Procedures / Treatments   Labs (all labs ordered are listed, but only abnormal results are displayed) Labs Reviewed  COMPREHENSIVE METABOLIC PANEL - Abnormal; Notable for the following components:      Result Value   Glucose, Bld 131 (*)    AST 92 (*)    ALT 55 (*)    Total Bilirubin 2.2 (*)    All other components within normal limits  CBC - Abnormal; Notable for the following components:   WBC 12.4 (*)    RBC 2.84 (*)    Hemoglobin 9.5 (*)    HCT 28.8 (*)    MCV 101.4 (*)    All other components within normal limits  CK - Abnormal; Notable for the following components:   Total CK 2,943 (*)    All other components within normal limits  I-STAT CHEM 8, ED - Abnormal; Notable for the following components:   BUN 25 (*)    Creatinine, Ser 0.40 (*)    Glucose, Bld 126 (*)    Calcium, Ion 1.11 (*)    Hemoglobin 10.2 (*)    HCT 30.0 (*)    All other components within normal limits  SARS CORONAVIRUS 2 BY RT PCR (HOSPITAL ORDER, [place] [facility])  ETHANOL  LACTIC ACID, PLASMA  PROTIME-INR  URINALYSIS, ROUTINE W REFLEX MICROSCOPIC  SAMPLE TO BLOOD BANK    EKG EKG Interpretation  Date/Time:  Tuesday December 07 2019 08:29:16 EDT Ventricular Rate:  99 PR Interval:    QRS Duration: 75 QT Interval:  426 QTC Calculation: 547 R Axis:   27 Text Interpretation: Normal sinus rhythm Borderline repolarization abnormality Prolonged QT interval Baseline wander Confirmed  by Isla Pence [phone]) on 12/07/2019 8:44:38 AM   Radiology CT HEAD WO CONTRAST  Result Date: 12/07/2019 CLINICAL DATA:  Fall EXAM: CT HEAD WITHOUT CONTRAST TECHNIQUE: Contiguous axial images were obtained from the base of the skull through the vertex without intravenous contrast. COMPARISON:  None. FINDINGS: Brain: There is atrophy and chronic small vessel disease changes. No acute intracranial abnormality. Specifically, no hemorrhage, hydrocephalus, mass lesion, acute infarction, or significant intracranial injury. Vascular: No hyperdense vessel or unexpected calcification. Skull: No acute calvarial abnormality. Sinuses/Orbits: Visualized paranasal sinuses and mastoids clear. Orbital soft tissues unremarkable. Other: None IMPRESSION: Atrophy, chronic microvascular disease. No acute intracranial abnormality. Electronically Signed   By: Rolm Baptise M.D.   On: 12/07/2019 10:17   CT CHEST W CONTRAST  Result Date: 12/07/2019 CLINICAL DATA:  Status post fall. EXAM: CT CHEST, ABDOMEN, AND PELVIS WITH CONTRAST TECHNIQUE: Multidetector CT imaging of the chest, abdomen and pelvis was performed following the standard protocol during bolus administration of intravenous contrast. CONTRAST:  136mL OMNIPAQUE IOHEXOL 300 MG/ML  SOLN COMPARISON:  None. FINDINGS: CT CHEST FINDINGS Cardiovascular: Atherosclerosis of thoracic aorta is noted without aneurysm or dissection. Normal cardiac size. No pericardial effusion is noted. Mediastinum/Nodes: Moderate size sliding-type hiatal hernia is noted. Thyroid gland is unremarkable. No adenopathy is noted. Lungs/Pleura: Lungs are clear. No pleural effusion or pneumothorax. Musculoskeletal: Severely comminuted and displaced proximal right humeral head and neck fracture is noted. CT ABDOMEN PELVIS FINDINGS Hepatobiliary: No gallstones or biliary dilatation is noted. 1.6 x 1.1 cm enhancing abnormality is noted in dome of right hepatic lobe which appears to have both supplying portal branch and  draining hepatic vein, suggesting portosystemic shunt. Pancreas: Unremarkable. No pancreatic ductal dilatation or surrounding inflammatory changes. Spleen: Normal in size without focal abnormality. Adrenals/Urinary Tract: Adrenal glands are unremarkable. Kidneys are normal, without renal calculi, focal lesion, or hydronephrosis. Bladder is unremarkable. Stomach/Bowel: There is no evidence of bowel obstruction or inflammation. Diverticulosis is noted throughout the colon without inflammation. Status post appendectomy. Vascular/Lymphatic: Aortic atherosclerosis. No enlarged abdominal or pelvic lymph nodes. Reproductive: Uterus and bilateral adnexa are unremarkable. Other: No abdominal wall hernia or abnormality. No abdominopelvic ascites. Musculoskeletal: No acute or significant osseous findings. IMPRESSION: 1. Severely comminuted and displaced proximal right humeral head and neck fracture is noted. 2. Moderate size sliding-type hiatal hernia. 3. 1.6 x 1.1 cm enhancing abnormality is noted in dome of right hepatic lobe which appears to have both supplying portal branch and draining hepatic vein, suggesting portosystemic shunt. 4. Diverticulosis is noted throughout the colon without inflammation. Aortic Atherosclerosis (ICD10-I70.0). Electronically Signed   By: Marijo Conception M.D.   On: 12/07/2019 10:35   CT CERVICAL SPINE WO CONTRAST  Result Date: 12/07/2019 CLINICAL DATA:  Fall, neck trauma EXAM: CT CERVICAL SPINE WITHOUT CONTRAST TECHNIQUE: Multidetector CT imaging of the cervical spine was performed without intravenous contrast. Multiplanar CT image reconstructions were also generated. COMPARISON:  None. FINDINGS: Alignment: Normal Skull base and vertebrae: No acute fracture. No primary bone lesion or focal pathologic process. Soft tissues and spinal canal: No prevertebral fluid or swelling. No visible canal hematoma. Disc levels: Diffuse degenerative facet disease bilaterally. Disc spaces maintained. Upper  chest: No acute findings Other: None IMPRESSION: Diffuse degenerative facet disease.  No acute bony abnormality. Electronically Signed   By: Rolm Baptise M.D.   On: 12/07/2019 10:19   CT ABDOMEN PELVIS W CONTRAST  Result Date: 12/07/2019 CLINICAL DATA:  Status post fall. EXAM: CT CHEST, ABDOMEN, AND PELVIS WITH  CONTRAST TECHNIQUE: Multidetector CT imaging of the chest, abdomen and pelvis was performed following the standard protocol during bolus administration of intravenous contrast. CONTRAST:  167mL OMNIPAQUE IOHEXOL 300 MG/ML  SOLN COMPARISON:  None. FINDINGS: CT CHEST FINDINGS Cardiovascular: Atherosclerosis of thoracic aorta is noted without aneurysm or dissection. Normal cardiac size. No pericardial effusion is noted. Mediastinum/Nodes: Moderate size sliding-type hiatal hernia is noted. Thyroid gland is unremarkable. No adenopathy is noted. Lungs/Pleura: Lungs are clear. No pleural effusion or pneumothorax. Musculoskeletal: Severely comminuted and displaced proximal right humeral head and neck fracture is noted. CT ABDOMEN PELVIS FINDINGS Hepatobiliary: No gallstones or biliary dilatation is noted. 1.6 x 1.1 cm enhancing abnormality is noted in dome of right hepatic lobe which appears to have both supplying portal branch and draining hepatic vein, suggesting portosystemic shunt. Pancreas: Unremarkable. No pancreatic ductal dilatation or surrounding inflammatory changes. Spleen: Normal in size without focal abnormality. Adrenals/Urinary Tract: Adrenal glands are unremarkable. Kidneys are normal, without renal calculi, focal lesion, or hydronephrosis. Bladder is unremarkable. Stomach/Bowel: There is no evidence of bowel obstruction or inflammation. Diverticulosis is noted throughout the colon without inflammation. Status post appendectomy. Vascular/Lymphatic: Aortic atherosclerosis. No enlarged abdominal or pelvic lymph nodes. Reproductive: Uterus and bilateral adnexa are unremarkable. Other: No abdominal  wall hernia or abnormality. No abdominopelvic ascites. Musculoskeletal: No acute or significant osseous findings. IMPRESSION: 1. Severely comminuted and displaced proximal right humeral head and neck fracture is noted. 2. Moderate size sliding-type hiatal hernia. 3. 1.6 x 1.1 cm enhancing abnormality is noted in dome of right hepatic lobe which appears to have both supplying portal branch and draining hepatic vein, suggesting portosystemic shunt. 4. Diverticulosis is noted throughout the colon without inflammation. Aortic Atherosclerosis (ICD10-I70.0). Electronically Signed   By: Marijo Conception M.D.   On: 12/07/2019 10:35   DG Pelvis Portable  Result Date: 12/07/2019 CLINICAL DATA:  Recent fall. EXAM: PORTABLE PELVIS 1-2 VIEWS COMPARISON:  None. FINDINGS: Negative for pelvic fracture. Mild degenerative change left hip with joint space narrowing and spurring. No acute bony lesion. IMPRESSION: No acute abnormality pain Electronically Signed   By: Franchot Gallo M.D.   On: 12/07/2019 09:11   DG Chest Port 1 View  Result Date: 12/07/2019 CLINICAL DATA:  Fall several days ago. EXAM: PORTABLE CHEST 1 VIEW COMPARISON:  None. FINDINGS: Lungs are well aerated. No infiltrate effusion or edema. Heart size upper normal. Comminuted fracture proximal right humerus. Multiple chronic right rib fractures. IMPRESSION: No acute cardiopulmonary abnormality Comminuted fracture proximal right humerus. Electronically Signed   By: Franchot Gallo M.D.   On: 12/07/2019 09:10   DG Shoulder Right Port  Result Date: 12/07/2019 CLINICAL DATA:  Recent fall. EXAM: PORTABLE RIGHT SHOULDER COMPARISON:  10/02/2013 FINDINGS: Comminuted fracture right humeral neck and humeral head. Ventral angulation. Multiple comminuted bone fragments. AC joint intact Chronic right rib fractures. IMPRESSION: Comminuted and angulated fracture proximal right humerus. Electronically Signed   By: Franchot Gallo M.D.   On: 12/07/2019 09:12   DG Foot Complete  Left  Result Date: 12/07/2019 CLINICAL DATA:  Left foot pain. Fell 2 days ago. EXAM: LEFT FOOT - COMPLETE 3+ VIEW COMPARISON:  None. FINDINGS: Moderate age related degenerative changes. No acute fracture is identified. Large calcaneal heel spur noted. IMPRESSION: Age related degenerative changes but no acute fracture. Electronically Signed   By: Marijo Sanes M.D.   On: 12/07/2019 09:13   CT MAXILLOFACIAL WO CONTRAST  Result Date: 12/07/2019 CLINICAL DATA:  Fall, left facial bruising EXAM: CT MAXILLOFACIAL WITHOUT  CONTRAST TECHNIQUE: Multidetector CT imaging of the maxillofacial structures was performed. Multiplanar CT image reconstructions were also generated. COMPARISON:  None. FINDINGS: Osseous: No fracture or mandibular dislocation. No destructive process. Orbits: Negative. No traumatic or inflammatory finding. Sinuses: Clear Soft tissues: Negative Limited intracranial: No acute findings IMPRESSION: No facial or orbital fracture. Electronically Signed   By: Rolm Baptise M.D.   On: 12/07/2019 10:18    Procedures Procedures (including critical care time)  Medications Ordered in ED Medications  sodium chloride 0.9 % bolus 1,000 mL (1,000 mLs Intravenous New Bag/Given 12/07/19 0904)    And  0.9 %  sodium chloride infusion (has no administration in time range)  LORazepam (ATIVAN) 2 MG/ML injection (has no administration in time range)  sodium bicarbonate 150 mEq in sterile water 1,000 mL infusion (has no administration in time range)  LORazepam (ATIVAN) injection 0.5 mg (0.5 mg Intravenous Given 12/07/19 0946)  iohexol (OMNIPAQUE) 300 MG/ML solution 100 mL (100 mLs Intravenous Contrast Given 12/07/19 1017)    ED Course  I have reviewed the triage vital signs and the nursing notes.  Pertinent labs & imaging results that were available during my care of the patient were reviewed by me and considered in my medical decision making (see chart for details).    MDM Rules/Calculators/A&P                           Pt has a complex humeral neck fx.  Pt d/w Dr. Marcelino Scot who said to place her in a sling and it can be managed as an outpatient.    Pt does have some mild rhabdo.  Pt given IVFs and bicarb.  Hgb 12.5 in 2017.  No more recent labs, but hgb now is 9.5 from bleeding from the fx.    Pt lives alone and is right hand dominant.  She will likely need rehab after her hospital stay.  Pt d/w Dr. Lorin Mercy for admission.  CRITICAL CARE Performed by: Isla Pence   Total critical care time: 30 minutes  Critical care time was exclusive of separately billable procedures and treating other patients.  Critical care was necessary to treat or prevent imminent or life-threatening deterioration.  Critical care was time spent personally by me on the following activities: development of treatment plan with patient and/or surrogate as well as nursing, discussions with consultants, evaluation of patient's response to treatment, examination of patient, obtaining history from patient or surrogate, ordering and performing treatments and interventions, ordering and review of laboratory studies, ordering and review of radiographic studies, pulse oximetry and re-evaluation of patient's condition.  Final Clinical Impression(s) / ED Diagnoses Final diagnoses:  Fall  Closed fracture of neck of right humerus, initial encounter  Traumatic rhabdomyolysis, initial encounter [facility])    Rx / [place] Orders ED Discharge Orders    None       Isla Pence, MD 12/07/19 1134

## 2019-12-07 NOTE — Plan of Care (Signed)

## 2019-12-07 NOTE — Progress Notes (Signed)
Orthopedic Tech Progress Note Patient Details:  Jennifer Rocha 1927/06/24 [id number]  Ortho Devices Type of Ortho Device: Shoulder immobilizer Ortho Device/Splint Location: RUE Ortho Device/Splint Interventions: Ordered, Application   Post Interventions Patient Tolerated: Well Instructions Provided: Care of device   Janit Pagan 12/07/2019, 10:47 AM

## 2019-12-07 NOTE — H&P (Signed)
History and Physical    Jennifer Rocha VQM:[id number] DOB: [birth year] DOA: 12/07/2019  PCP: Reynold Bowen, MD Consultants:  Tamala Julian - cardiology; Henrene Pastor - GI Patient coming from:  Home - lives alone; NOK: Daughter, Jennifer Rocha, [phone]  Chief Complaint: Fall  HPI: Jennifer Rocha is a 86 y.o. female with medical history significant of chronic combined CHF; HTN; and HLD presenting with a fall on Saturday, down since.  Family is not sure what happened.  The patient called her daughter this AM and reported that she was unable to get up and started talking about trees and other things that didn't make sense.  Her son called Sunday and she didn't return the call.  Her daughter talked to her Saturday morning.  Her neighbors saw her go to church Sunday but she didn't pick up the Monday or Tuesday paper.  They think maybe she fell Sunday night.  She does not remember what happened.  Family is noticing the start of memory loss.  She fell last week.  When her daughter visited last week, she was having trouble remembering her son's wife's name.  She is repeating herself occasionally.  She does still drive, hasn't gotten lost so far as her daughter knows.  She leaves the garage door open occasionally.    ED Course:  [place] Saturday, finally able to call her daughter today.  Humeral neck fracture that bled into skin for days.  Mild rhabdo.  Dr. Marcelino Scot recommends splint and outpatient f/u.  Other trauma evaluation was negative.  Hgb 9.5.  Likely to need placement.  Increasing confusion over the last month or so.  Review of Systems: Unable to perform  Ambulatory Status:  Ambulates without assistance but daughter has noticed progressive balance issues  COVID Vaccine Status:  Complete  Past Medical History:  Diagnosis Date  . Alcohol dependence ([place])   . HLD (hyperlipidemia)   . Hypertension   . Left heart failure ([place])   . Non-ischemic cardiomyopathy ([place])    with EF 20% improving to 50%     Past Surgical History:  Procedure Laterality Date  . ANKLE SURGERY Right   . APPENDECTOMY  1941  . TONSILLECTOMY      Social History   Socioeconomic History  . Marital status: Widowed    Spouse name: Not on file  . Number of children: 3  . Years of education: 74  . Highest education level: Not on file  Occupational History  . Occupation: Retired   Tobacco Use  . Smoking status: Former Smoker    Quit date: 05/27/1953    Years since quitting: 66.5  . Smokeless tobacco: Never Used  Substance and Sexual Activity  . Alcohol use: Yes    Alcohol/week: 21.0 standard drinks    Types: 21 Glasses of wine per week    Comment: 3 glasses per day  . Drug use: Not Currently    Comment: has abused prescription drugs at times  . Sexual activity: Not on file  Other Topics Concern  . Not on file  Social History Narrative   Lives at home by herself   1-2 cups coffee per day   Social Determinants of Health   Financial Resource Strain:   . Difficulty of Paying Living Expenses:   Food Insecurity:   . Worried About Charity fundraiser in the Last Year:   . Arboriculturist in the Last Year:   Transportation Needs:   . Film/video editor (Medical):   Marland Kitchen  Lack of Transportation (Non-Medical):   Physical Activity:   . Days of Exercise per Week:   . Minutes of Exercise per Session:   Stress:   . Feeling of Stress :   Social Connections:   . Frequency of Communication with Friends and Family:   . Frequency of Social Gatherings with Friends and Family:   . Attends Religious Services:   . Active Member of Clubs or Organizations:   . Attends Archivist Meetings:   Marland Kitchen Marital Status:   Intimate Partner Violence:   . Fear of Current or Ex-Partner:   . Emotionally Abused:   Marland Kitchen Physically Abused:   . Sexually Abused:     Allergies  Allergen Reactions  . Demerol [Meperidine] Nausea And Vomiting  . Novocain [Procaine]     Family History  Adopted: Yes  Problem Relation  Age of Onset  . Heart failure Mother   . Heart disease Brother   . Alcohol abuse Other        her daughter, addiction "gallops in our family"    Prior to Admission medications   Medication Sig Start Date End Date Taking? Authorizing Provider  aspirin EC 81 MG tablet Take 81 mg by mouth daily.   Yes [provider]  atorvastatin (LIPITOR) 40 MG tablet Take 1 tablet by mouth daily. 05/28/13  Yes [provider]  carvedilol (COREG) 6.25 MG tablet Take 1 tablet by mouth 2 (two) times daily.  06/02/13  Yes [provider]  Cholecalciferol (VITAMIN D3) 50000 units CAPS Take 50,000 Units by mouth See admin instructions. Every other week. 07/01/17  Yes [provider]  DULoxetine (CYMBALTA) 30 MG capsule Take 1 capsule by mouth daily.  05/06/13  Yes [provider]  losartan (COZAAR) 50 MG tablet Take 50 mg by mouth daily. 07/02/16  Yes [provider]  Multiple Vitamins-Minerals (ICAPS MV) TABS Take 2 tablets by mouth at bedtime.    Yes [provider]  Polysaccharide Iron Complex (POLY-IRON 150 PO) Take 1 tablet by mouth 2 (two) times daily.   Yes [provider]    Physical Exam: Vitals:   12/07/19 1600 12/07/19 1659 12/07/19 1735 12/07/19 1807  BP: 123/74 (!) 126/59 (!) 132/55 (!) 121/54  Pulse: (!) 104 93 99 94  Resp: 18 14 (!) 22 18  Temp:    98.5 F (36.9 C)  TempSrc:    Axillary  SpO2: 98% 97% 95% 100%  Weight:      Height:         . General:  Appears somnolent and comfortable, s/p Ativan . Eyes:   EOMI, normal lids, iris . ENT:  grossly normal lips & tongue, mmm . Neck:  no LAD, masses or thyromegaly . Cardiovascular:  RRR, no m/r/g. No LE edema.  Marland Kitchen Respiratory:   CTA bilaterally with no wheezes/rales/rhonchi.  Normal respiratory effort. . Abdomen:  soft, NT, ND, NABS . Skin:  Diffuse Rsided ecchymoses with scattered LE bruising     . Musculoskeletal:  grossly normal tone BUE/BLE, RUE in  sling . Psychiatric:  Sedated from Ativan, uncomfortable with any movement . Neurologic:  Unable to perform    Radiological Exams on Admission: CT HEAD WO CONTRAST  Result Date: 12/07/2019 CLINICAL DATA:  Fall EXAM: CT HEAD WITHOUT CONTRAST TECHNIQUE: Contiguous axial images were obtained from the base of the skull through the vertex without intravenous contrast. COMPARISON:  None. FINDINGS: Brain: There is atrophy and chronic small vessel disease changes. No acute intracranial  abnormality. Specifically, no hemorrhage, hydrocephalus, mass lesion, acute infarction, or significant intracranial injury. Vascular: No hyperdense vessel or unexpected calcification. Skull: No acute calvarial abnormality. Sinuses/Orbits: Visualized paranasal sinuses and mastoids clear. Orbital soft tissues unremarkable. Other: None IMPRESSION: Atrophy, chronic microvascular disease. No acute intracranial abnormality. Electronically Signed   By: Rolm Baptise M.D.   On: 12/07/2019 10:17   CT CHEST W CONTRAST  Result Date: 12/07/2019 CLINICAL DATA:  Status post fall. EXAM: CT CHEST, ABDOMEN, AND PELVIS WITH CONTRAST TECHNIQUE: Multidetector CT imaging of the chest, abdomen and pelvis was performed following the standard protocol during bolus administration of intravenous contrast. CONTRAST:  143mL OMNIPAQUE IOHEXOL 300 MG/ML  SOLN COMPARISON:  None. FINDINGS: CT CHEST FINDINGS Cardiovascular: Atherosclerosis of thoracic aorta is noted without aneurysm or dissection. Normal cardiac size. No pericardial effusion is noted. Mediastinum/Nodes: Moderate size sliding-type hiatal hernia is noted. Thyroid gland is unremarkable. No adenopathy is noted. Lungs/Pleura: Lungs are clear. No pleural effusion or pneumothorax. Musculoskeletal: Severely comminuted and displaced proximal right humeral head and neck fracture is noted. CT ABDOMEN PELVIS FINDINGS Hepatobiliary: No gallstones or biliary dilatation is noted. 1.6 x 1.1 cm enhancing  abnormality is noted in dome of right hepatic lobe which appears to have both supplying portal branch and draining hepatic vein, suggesting portosystemic shunt. Pancreas: Unremarkable. No pancreatic ductal dilatation or surrounding inflammatory changes. Spleen: Normal in size without focal abnormality. Adrenals/Urinary Tract: Adrenal glands are unremarkable. Kidneys are normal, without renal calculi, focal lesion, or hydronephrosis. Bladder is unremarkable. Stomach/Bowel: There is no evidence of bowel obstruction or inflammation. Diverticulosis is noted throughout the colon without inflammation. Status post appendectomy. Vascular/Lymphatic: Aortic atherosclerosis. No enlarged abdominal or pelvic lymph nodes. Reproductive: Uterus and bilateral adnexa are unremarkable. Other: No abdominal wall hernia or abnormality. No abdominopelvic ascites. Musculoskeletal: No acute or significant osseous findings. IMPRESSION: 1. Severely comminuted and displaced proximal right humeral head and neck fracture is noted. 2. Moderate size sliding-type hiatal hernia. 3. 1.6 x 1.1 cm enhancing abnormality is noted in dome of right hepatic lobe which appears to have both supplying portal branch and draining hepatic vein, suggesting portosystemic shunt. 4. Diverticulosis is noted throughout the colon without inflammation. Aortic Atherosclerosis (ICD10-I70.0). Electronically Signed   By: Marijo Conception M.D.   On: 12/07/2019 10:35   CT CERVICAL SPINE WO CONTRAST  Result Date: 12/07/2019 CLINICAL DATA:  Fall, neck trauma EXAM: CT CERVICAL SPINE WITHOUT CONTRAST TECHNIQUE: Multidetector CT imaging of the cervical spine was performed without intravenous contrast. Multiplanar CT image reconstructions were also generated. COMPARISON:  None. FINDINGS: Alignment: Normal Skull base and vertebrae: No acute fracture. No primary bone lesion or focal pathologic process. Soft tissues and spinal canal: No prevertebral fluid or swelling. No visible  canal hematoma. Disc levels: Diffuse degenerative facet disease bilaterally. Disc spaces maintained. Upper chest: No acute findings Other: None IMPRESSION: Diffuse degenerative facet disease.  No acute bony abnormality. Electronically Signed   By: Rolm Baptise M.D.   On: 12/07/2019 10:19   CT ABDOMEN PELVIS W CONTRAST  Result Date: 12/07/2019 CLINICAL DATA:  Status post fall. EXAM: CT CHEST, ABDOMEN, AND PELVIS WITH CONTRAST TECHNIQUE: Multidetector CT imaging of the chest, abdomen and pelvis was performed following the standard protocol during bolus administration of intravenous contrast. CONTRAST:  133mL OMNIPAQUE IOHEXOL 300 MG/ML  SOLN COMPARISON:  None. FINDINGS: CT CHEST FINDINGS Cardiovascular: Atherosclerosis of thoracic aorta is noted without aneurysm or dissection. Normal cardiac size. No pericardial effusion is noted. Mediastinum/Nodes: Moderate size sliding-type hiatal hernia  is noted. Thyroid gland is unremarkable. No adenopathy is noted. Lungs/Pleura: Lungs are clear. No pleural effusion or pneumothorax. Musculoskeletal: Severely comminuted and displaced proximal right humeral head and neck fracture is noted. CT ABDOMEN PELVIS FINDINGS Hepatobiliary: No gallstones or biliary dilatation is noted. 1.6 x 1.1 cm enhancing abnormality is noted in dome of right hepatic lobe which appears to have both supplying portal branch and draining hepatic vein, suggesting portosystemic shunt. Pancreas: Unremarkable. No pancreatic ductal dilatation or surrounding inflammatory changes. Spleen: Normal in size without focal abnormality. Adrenals/Urinary Tract: Adrenal glands are unremarkable. Kidneys are normal, without renal calculi, focal lesion, or hydronephrosis. Bladder is unremarkable. Stomach/Bowel: There is no evidence of bowel obstruction or inflammation. Diverticulosis is noted throughout the colon without inflammation. Status post appendectomy. Vascular/Lymphatic: Aortic atherosclerosis. No enlarged  abdominal or pelvic lymph nodes. Reproductive: Uterus and bilateral adnexa are unremarkable. Other: No abdominal wall hernia or abnormality. No abdominopelvic ascites. Musculoskeletal: No acute or significant osseous findings. IMPRESSION: 1. Severely comminuted and displaced proximal right humeral head and neck fracture is noted. 2. Moderate size sliding-type hiatal hernia. 3. 1.6 x 1.1 cm enhancing abnormality is noted in dome of right hepatic lobe which appears to have both supplying portal branch and draining hepatic vein, suggesting portosystemic shunt. 4. Diverticulosis is noted throughout the colon without inflammation. Aortic Atherosclerosis (ICD10-I70.0). Electronically Signed   By: Marijo Conception M.D.   On: 12/07/2019 10:35   DG Pelvis Portable  Result Date: 12/07/2019 CLINICAL DATA:  Recent fall. EXAM: PORTABLE PELVIS 1-2 VIEWS COMPARISON:  None. FINDINGS: Negative for pelvic fracture. Mild degenerative change left hip with joint space narrowing and spurring. No acute bony lesion. IMPRESSION: No acute abnormality pain Electronically Signed   By: Franchot Gallo M.D.   On: 12/07/2019 09:11   DG Chest Port 1 View  Result Date: 12/07/2019 CLINICAL DATA:  Fall several days ago. EXAM: PORTABLE CHEST 1 VIEW COMPARISON:  None. FINDINGS: Lungs are well aerated. No infiltrate effusion or edema. Heart size upper normal. Comminuted fracture proximal right humerus. Multiple chronic right rib fractures. IMPRESSION: No acute cardiopulmonary abnormality Comminuted fracture proximal right humerus. Electronically Signed   By: Franchot Gallo M.D.   On: 12/07/2019 09:10   DG Shoulder Right Port  Result Date: 12/07/2019 CLINICAL DATA:  Recent fall. EXAM: PORTABLE RIGHT SHOULDER COMPARISON:  10/02/2013 FINDINGS: Comminuted fracture right humeral neck and humeral head. Ventral angulation. Multiple comminuted bone fragments. AC joint intact Chronic right rib fractures. IMPRESSION: Comminuted and angulated fracture  proximal right humerus. Electronically Signed   By: Franchot Gallo M.D.   On: 12/07/2019 09:12   DG Foot Complete Left  Result Date: 12/07/2019 CLINICAL DATA:  Left foot pain. Fell 2 days ago. EXAM: LEFT FOOT - COMPLETE 3+ VIEW COMPARISON:  None. FINDINGS: Moderate age related degenerative changes. No acute fracture is identified. Large calcaneal heel spur noted. IMPRESSION: Age related degenerative changes but no acute fracture. Electronically Signed   By: Marijo Sanes M.D.   On: 12/07/2019 09:13   CT MAXILLOFACIAL WO CONTRAST  Result Date: 12/07/2019 CLINICAL DATA:  Fall, left facial bruising EXAM: CT MAXILLOFACIAL WITHOUT CONTRAST TECHNIQUE: Multidetector CT imaging of the maxillofacial structures was performed. Multiplanar CT image reconstructions were also generated. COMPARISON:  None. FINDINGS: Osseous: No fracture or mandibular dislocation. No destructive process. Orbits: Negative. No traumatic or inflammatory finding. Sinuses: Clear Soft tissues: Negative Limited intracranial: No acute findings IMPRESSION: No facial or orbital fracture. Electronically Signed   By: Rolm Baptise M.D.  On: 12/07/2019 10:18    EKG: Independently reviewed.  NSR with rate 99, prolonged QTc 547; nonspecific ST changes with no evidence of acute ischemia   Labs on Admission: I have personally reviewed the available labs and imaging studies at the time of the admission.  Pertinent labs:   Glucose 131 AST 92/ALT 55/Bili 2.2 CK 2943 Lactate 1.9 WBC 12.4 Hgb 9.5 INR 1.0 ETOH <10   Assessment/Plan Principal Problem:   Traumatic rhabdomyolysis (HCC) Active Problems:   Hypertension   Chronic combined systolic (congestive) and diastolic (congestive) heart failure (HCC)   Fracture of humeral head, closed, right, initial encounter   Fall at home, initial encounter   Alcohol dependence syndrome ([place])   Traumatic rhabdomyolysis from fall -CK 2943 -Appears to be associated with a fall at home with  prolonged time down - fall appears to have been Sunday and she was found today -Will admit to telemetry with fairly aggressive IVF at 125 cc/hour -BMP q12h, repeat CK in AM -CBC qAM -SCDs for DVT prophylaxis given significant bruising -Push PO fluids if able -Strict I/Os -Vital signs q4h -Hold ASA for now  Humeral head fracture -Traumatic -Patient d/w Dr. Marcelino Scot by EDP -Plan is for sling and monitoring -If pain is manageable without need for surgery, this is preferred given her medical circumstances -She appears unlikely to be successful returning to her home environment independently and so TOC consult requested  HTN -Continue Coreg -Hold Cozaar while in rhabdo  Chronic combined CHF -Appears to be compensated at this time -Will need to be monitored while receiving aggressive IVF  HLD -Continue Lipitor for now but consider d/c of this medication in the future  ETOH dependence -Patient with chronic ETOH dependence -She is at increased risk for complications of withdrawal including seizures, DTs -CIWA protocol  Goals of care -Family has raised question of dementia  -Patient is living alone and may require placement -Palliative care consult for goals of care -DNR at time of admission    Note: This patient has been tested and is negative for the novel coronavirus COVID-19.    DVT prophylaxis:  SCDs Code Status:  DNR - confirmed with family Family Communication: Daughter was present throughout the evaluation. Disposition Plan:  The patient is from: home  Anticipated d/c is to: SNF  Anticipated d/c date will depend on clinical response to treatment, likely several days  Patient is currently: acutely ill Consults called:  Palliative care, orthopedics; will need PT/OT Admission status:  Admit - It is my clinical opinion that admission to INPATIENT is reasonable and necessary because of the expectation that this patient will require hospital care that crosses at least 2  midnights to treat this condition based on the medical complexity of the problems presented.  Given the aforementioned information, the predictability of an adverse outcome is felt to be significant.    Karmen Bongo MD [facility]   How to contact the [facility] Attending or Consulting provider [place] or covering provider during after hours [place], for this patient?  1. Check the care team in [facility] and look for a) attending/consulting TRH provider listed and b) the [facility] team listed 2. Log into [email or web address] and use [place]'s universal password to access. If you do not have the password, please contact the hospital operator. 3. Locate the [facility] provider you are looking for under [facility] and page to a number that you can be directly reached. 4. If you still have difficulty reaching the provider, please  page the [facility] (Director on Call) for the Hospitalists listed on amion for assistance.   12/07/2019, 8:39 PM

## 2019-12-07 NOTE — ED Triage Notes (Signed)
Patient arrives to ED with [facility] with complaints of a fall on Saturday. Patient states she stayed laying down on the floor since Saturday and could not get up. Today patient was able to get a hold of daughter via phone. Patient is confused of event and does not remember falling or why she was not able to get up. Large hematoma noted to right shoulder, arm, and right side of back.

## 2019-12-08 DIAGNOSIS — Z515 Encounter for palliative care: Secondary | ICD-10-CM

## 2019-12-08 DIAGNOSIS — D649 Anemia, unspecified: Secondary | ICD-10-CM

## 2019-12-08 DIAGNOSIS — S42211A Unspecified displaced fracture of surgical neck of right humerus, initial encounter for closed fracture: Secondary | ICD-10-CM

## 2019-12-08 DIAGNOSIS — Z66 Do not resuscitate: Secondary | ICD-10-CM

## 2019-12-08 DIAGNOSIS — M25511 Pain in right shoulder: Secondary | ICD-10-CM

## 2019-12-08 LAB — BASIC METABOLIC PANEL
Anion gap: 8 (ref 5–15)
BUN: 20 mg/dL (ref 8–23)
CO2: 27 mmol/L (ref 22–32)
Calcium: 8.3 mg/dL — ABNORMAL LOW (ref 8.9–10.3)
Chloride: 105 mmol/L (ref 98–111)
Creatinine, Ser: 0.49 mg/dL (ref 0.44–1.00)
GFR calc Af Amer: >60 mL/min (ref >60)
GFR calc non Af Amer: >60 mL/min (ref >60)
Glucose, Bld: 104 mg/dL — ABNORMAL HIGH (ref 70–99)
Potassium: 3 mmol/L — ABNORMAL LOW (ref 3.5–5.1)
Sodium: 140 mmol/L (ref 135–145)

## 2019-12-08 LAB — CBC
HCT: 23 % — ABNORMAL LOW (ref 36.0–46.0)
Hemoglobin: 7.4 g/dL — ABNORMAL LOW (ref 12.0–15.0)
MCH: 34.3 pg — ABNORMAL HIGH (ref 26.0–34.0)
MCHC: 32.2 g/dL (ref 30.0–36.0)
MCV: 106.5 fL — ABNORMAL HIGH (ref 80.0–100.0)
Platelets: 200 10*3/uL (ref 150–400)
RBC: 2.16 MIL/uL — ABNORMAL LOW (ref 3.87–5.11)
RDW: 13.8 % (ref 11.5–15.5)
WBC: 9.3 10*3/uL (ref 4.0–10.5)
nRBC: 0.5 % — ABNORMAL HIGH (ref 0.0–0.2)

## 2019-12-08 LAB — CK: Total CK: 1102 U/L — ABNORMAL HIGH (ref 38–234)

## 2019-12-08 LAB — MYOGLOBIN, SERUM: Myoglobin: 368 ng/mL — ABNORMAL HIGH (ref 25–58)

## 2019-12-08 LAB — MAGNESIUM: Magnesium: 2.2 mg/dL (ref 1.7–2.4)

## 2019-12-08 MED ORDER — POTASSIUM CHLORIDE CRYS ER 20 MEQ PO TBCR
40.0000 meq | EXTENDED_RELEASE_TABLET | ORAL | Status: DC
  Filled 2019-12-08: qty 2

## 2019-12-08 MED ORDER — POTASSIUM CHLORIDE 20 MEQ PO PACK
40.0000 meq | PACK | ORAL | Status: AC
  Administered 2019-12-08 (×2): 40 meq via ORAL
  Filled 2019-12-08 (×2): qty 2

## 2019-12-08 MED ORDER — ORAL CARE MOUTH RINSE
15.0000 mL | Freq: Two times a day (BID) | OROMUCOSAL | Status: DC
  Administered 2019-12-09 – 2019-12-15 (×13): 15 mL via OROMUCOSAL

## 2019-12-08 MED ORDER — ACETAMINOPHEN 325 MG PO TABS
650.0000 mg | ORAL_TABLET | Freq: Four times a day (QID) | ORAL | Status: DC
  Administered 2019-12-08 – 2019-12-15 (×28): 650 mg via ORAL
  Filled 2019-12-08 (×28): qty 2

## 2019-12-08 NOTE — Consult Note (Signed)
Consultation Note Date: 12/08/2019   Patient Name: Jennifer Rocha  DOB: 03-Oct-1927  MRN: [id number]  Age / Sex: 86 y.o., female  PCP: Reynold Bowen, MD Referring Physician: Bonnielee Haff, MD  Reason for Consultation: Establishing goals of care and Psychosocial/spiritual support  HPI/Patient Profile: 86 y.o. female   admitted on 12/07/2019 with   medical history significant of chronic combined CHF; HTN; and HLD presenting with a fall at home with prolonged downtime.  Admitted with rhabdomyolysis, and humeral head fracture on the right.  Family report a previous fall last week and intermittent confusion over the past month.  Patient was living at home alone and driving. X-ray of right shoulder significant finding for comminuted fracture of the right humeral neck and humeral head, chronic right rib fractures Orthopedic recommends splint and outpatient follow-up.    Currently patient is lethargic, impaired mobility, difficulty swallowing.  Family face treatment option decisions, advanced directive decisions and anticipatory care needs.  Clinical Assessment and Goals of Care:   This NP Wadie Lessen reviewed medical records, received report from team, assessed the patient and then meet at the patient's bedside with her daughter/Jennifer Rocha, her son Jennifer Rocha and her son Jennifer Rocha was on telephone conference to discuss diagnosis, prognosis, [place], EOL wishes disposition and options.   Concept of Palliative Care was introduced as specialized medical care for people and their families living with serious illness.  If focuses on providing relief from the symptoms and stress of a serious illness.  The goal is to improve quality of life for both the patient and the family.  Created space and opportunity for family to explore the thoughts and feelings regarding patient's current medical situation. All 3 siblings today verbalize an  understanding that quality and independence are of utmost importance to this patient.  Family agree that they would not want prolonged aggressive measures.  Today is only day 1 of this hospitalization and family remain hopeful for significant improvement.  Continue current medical interventions, treat the treatable and continue to reevaluate.  Family will make decisions on patient outcomes over the next few days.  A  discussion was had today regarding advanced directives.  Concepts specific to code status, artifical feeding and hydration, continued IV antibiotics and rehospitalization was had.  The difference between a aggressive medical intervention path  and a palliative comfort care path for this patient at this time was had.  Values and goals of care important to patient and family were attempted to be elicited.   MOST form  is introduced and a hard choices booklet was left for review   Natural trajectory and expectations at EOL were discussed.  Questions and concerns addressed.  Patient  encouraged to call with questions or concerns.     PMT will continue to support holistically.           There is a documented healthcare power of attorney and that person is her daughter [name]. Copy placed in hard chart     SUMMARY OF RECOMMENDATIONS    Code Status/Advance  Care Planning:  DNR   Symptom Management:   Tylenol 650 mg po scheduled every 6 hours for pain associated with right shoulder injury.   Minimize use of opioids  Palliative Prophylaxis:   Aspiration, Bowel Regimen, Delirium Protocol, Frequent Pain Assessment and Oral Care  Additional Recommendations (Limitations, Scope, Preferences):  Full Scope Treatment  Psycho-social/Spiritual:   Desire for further Chaplaincy support:yes   Prognosis:   Unable to determine  Discharge Planning: To Be Determined      Primary Diagnoses: Present on Admission: . Traumatic rhabdomyolysis ([place]) . Fracture of humeral head,  closed, right, initial encounter . Chronic combined systolic (congestive) and diastolic (congestive) heart failure ([place]) . Hypertension . Alcohol dependence syndrome ([place])   I have reviewed the medical record, interviewed the patient and family, and examined the patient. The following aspects are pertinent.  Past Medical History:  Diagnosis Date  . Alcohol dependence ([place])   . HLD (hyperlipidemia)   . Hypertension   . Left heart failure ([place])   . Non-ischemic cardiomyopathy ([place])    with EF 20% improving to 50%   Social History   Socioeconomic History  . Marital status: Widowed    Spouse name: Not on file  . Number of children: 3  . Years of education: 110  . Highest education level: Not on file  Occupational History  . Occupation: Retired   Tobacco Use  . Smoking status: Former Smoker    Quit date: 05/27/1953    Years since quitting: 66.5  . Smokeless tobacco: Never Used  Substance and Sexual Activity  . Alcohol use: Yes    Alcohol/week: 21.0 standard drinks    Types: 21 Glasses of wine per week    Comment: 3 glasses per day  . Drug use: Not Currently    Comment: has abused prescription drugs at times  . Sexual activity: Not on file  Other Topics Concern  . Not on file  Social History Narrative   Lives at home by herself   1-2 cups coffee per day   Social Determinants of Health   Financial Resource Strain:   . Difficulty of Paying Living Expenses:   Food Insecurity:   . Worried About Charity fundraiser in the Last Year:   . Arboriculturist in the Last Year:   Transportation Needs:   . Film/video editor (Medical):   Marland Kitchen Lack of Transportation (Non-Medical):   Physical Activity:   . Days of Exercise per Week:   . Minutes of Exercise per Session:   Stress:   . Feeling of Stress :   Social Connections:   . Frequency of Communication with Friends and Family:   . Frequency of Social Gatherings with Friends and Family:   . Attends Religious Services:   .  Active Member of Clubs or Organizations:   . Attends Archivist Meetings:   Marland Kitchen Marital Status:    Family History  Adopted: Yes  Problem Relation Age of Onset  . Heart failure Mother   . Heart disease Brother   . Alcohol abuse Other        her daughter, addiction "gallops in our family"   Scheduled Meds: . atorvastatin  40 mg Oral Daily  . carvedilol  6.25 mg Oral BID  . docusate sodium  100 mg Oral BID  . DULoxetine  30 mg Oral Daily  . folic acid  1 mg Oral Daily  . multivitamin with minerals  1 tablet Oral Daily  .  potassium chloride  40 mEq Oral Q4H  . thiamine  100 mg Oral Daily   Or  . thiamine  100 mg Intravenous Daily   Continuous Infusions: . sodium chloride 125 mL/hr at 12/08/19 0536  . methocarbamol (ROBAXIN) IV     PRN Meds:.acetaminophen **OR** acetaminophen, bisacodyl, LORazepam **OR** LORazepam, methocarbamol (ROBAXIN) IV, morphine injection, ondansetron **OR** ondansetron (ZOFRAN) IV, polyethylene glycol Medications Prior to Admission:  Prior to Admission medications   Medication Sig Start Date End Date Taking? Authorizing Provider  aspirin EC 81 MG tablet Take 81 mg by mouth daily.   Yes [provider]  atorvastatin (LIPITOR) 40 MG tablet Take 1 tablet by mouth daily. 05/28/13  Yes [provider]  carvedilol (COREG) 6.25 MG tablet Take 1 tablet by mouth 2 (two) times daily.  06/02/13  Yes [provider]  Cholecalciferol (VITAMIN D3) 50000 units CAPS Take 50,000 Units by mouth See admin instructions. Every other week. 07/01/17  Yes [provider]  DULoxetine (CYMBALTA) 30 MG capsule Take 1 capsule by mouth daily.  05/06/13  Yes [provider]  losartan (COZAAR) 50 MG tablet Take 50 mg by mouth daily. 07/02/16  Yes [provider]  Multiple Vitamins-Minerals (ICAPS MV) TABS Take 2 tablets by mouth at bedtime.    Yes [provider]  Polysaccharide Iron Complex (POLY-IRON 150 PO) Take 1 tablet by  mouth 2 (two) times daily.   Yes [provider]   Allergies  Allergen Reactions  . Demerol [Meperidine] Nausea And Vomiting  . Novocain [Procaine]    Review of Systems  Unable to perform ROS: Acuity of condition    Physical Exam Constitutional:      Appearance: She is normal weight. She is ill-appearing.  Cardiovascular:     Rate and Rhythm: Normal rate.  Musculoskeletal:     Comments: Noted right shoulder and right chest area bruising  Skin:    General: Skin is warm and dry.  Neurological:     Mental Status: She is alert.     Comments: -Patient is minimally verbal and unable to follow commands currently     Vital Signs: BP 137/64 (BP Location: Left Arm)   Pulse 84   Temp 98.2 F (36.8 C) (Oral)   Resp 18   Ht 5\' 3"  (1.6 m)   Wt 65 kg   SpO2 98%   BMI 25.38 kg/m  Pain Scale: 0-10   Pain Score: Asleep   SpO2: SpO2: 98 % O2 Device:SpO2: 98 % O2 Flow Rate: .   IO: Intake/output summary:   Intake/Output Summary (Last 24 hours) at 12/08/2019 1143 Last data filed at 12/08/2019 0400 Gross per 24 hour  Intake 2731.25 ml  Output --  Net 2731.25 ml    LBM: Last BM Date: 12/07/19 Baseline Weight: Weight: 65 kg Most recent weight: Weight: 65 kg     Palliative Assessment/Data:   Discussed with Dr. Darrick Meigs on via secure chat  This nurse practitioner informed  the family and the attending that I will be out of the hospital until Monday morning.  If the patient is still hospitalized I will follow-up at that time. Provider with the palliative medicine team will follow up as needed through the weekend.   Time In: 1300 Time Out: 1415 Time Total: 75 minutes Greater than 50%  of this time was spent counseling and coordinating care related to the above assessment and plan.  Signed by: Wadie Lessen, NP   Please contact Palliative Medicine Team phone  at [phone] for questions and concerns.  For individual provider: See Shea Evans

## 2019-12-08 NOTE — Evaluation (Addendum)
Physical Therapy Evaluation Patient Details Name: Jennifer Rocha MRN: [id number] DOB: 12/25/1927 Today's Date: 12/08/2019   History of Present Illness  Pt is a 86 y.o. F with significant PMH of CHF, HTN, who presents after a fall and extended time down with rhabdomyolysis, right humerus fracture and acute metabolic encephalopathy.  Clinical Impression  Prior to admission, pt lives alone and is independent. She does have a history of a couple recent falls and memory loss per family members (daughter and son). On PT evaluation, pt presents with decreased functional RUE use, pain, balance deficits, poor cognition and communication deficits (RN notified). Requiring min assist for limited ambulation from bed to chair. Requested larger sling size from ortho tech. Pt presents as a high fall risk based on deficits listed above and history of falls. Recommending SNF at discharge to maximize functional mobility.     Follow Up Recommendations SNF    Equipment Recommendations  Other (comment) (defer)    Recommendations for Other Services       Precautions / Restrictions Precautions Precautions: Fall Required Braces or Orthoses: Sling Restrictions Weight Bearing Restrictions: Yes RUE Weight Bearing: Non weight bearing      Mobility  Bed Mobility Overal bed mobility: Needs Assistance Bed Mobility: Sit to Supine;Rolling Rolling: Max assist     Sit to supine: Min assist   General bed mobility comments: MinA for LE guidance back into bed. maxA for rolling towards left to place sacral foam pad  Transfers Overall transfer level: Needs assistance Equipment used: 1 person hand held assist Transfers: Sit to/from Stand Sit to Stand: Mod assist         General transfer comment: ModA to rise to stand from recliner, pushing up with L hand  Ambulation/Gait Ambulation/Gait assistance: Min assist Gait Distance (Feet): 3 Feet Assistive device: 1 person hand held assist Gait  Pattern/deviations: Step-through pattern;Decreased stride length;Shuffle Gait velocity: decreased Gait velocity interpretation: <1.31 ft/sec, indicative of household ambulator General Gait Details: Pivotal steps from bed to chair, cues for sequencing/direction.  Stairs            Wheelchair Mobility    Modified Rankin (Stroke Patients Only)       Balance Overall balance assessment: Needs assistance Sitting-balance support: Feet supported Sitting balance-Leahy Scale: Fair     Standing balance support: Single extremity supported;During functional activity Standing balance-Leahy Scale: Poor Standing balance comment: reliant on single UE support                             Pertinent Vitals/Pain Pain Assessment: Faces Faces Pain Scale: No hurt Pain Location: R shoulder  Pain Descriptors / Indicators: Grimacing;Guarding Pain Intervention(s): Monitored during session;Limited activity within patient's tolerance    Home Living Family/patient expects to be discharged to:: Skilled nursing facility                      Prior Function Level of Independence: Independent         Comments: Independent ADL's/IADL's, drives     Hand Dominance        Extremity/Trunk Assessment   Upper Extremity Assessment Upper Extremity Assessment: RUE deficits/detail;Generalized weakness RUE Deficits / Details: Humerus fracture, strong hand grip    Lower Extremity Assessment Lower Extremity Assessment: Generalized weakness       Communication   Communication: Expressive difficulties  Cognition Arousal/Alertness: Awake/alert Behavior During Therapy: Restless Overall Cognitive Status: Impaired/Different from baseline Area of Impairment: Orientation;Following  commands;Awareness;Attention                 Orientation Level: Disoriented to;Place;Time;Situation Current Attention Level: Focused   Following Commands: Follows one step commands  inconsistently   Awareness: Intellectual   General Comments: Pt alert, smiling at times, mumbling mostly unintelligible speech. Following ~25% of 1 step commands. Pt family reports at baseline, she has some memory loss but impaired communication/acute confusion is new.       General Comments      Exercises     Assessment/Plan    PT Assessment Patient needs continued PT services  PT Problem List Decreased strength;Decreased range of motion;Decreased activity tolerance;Decreased balance;Decreased mobility;Decreased cognition;Decreased safety awareness;Pain       PT Treatment Interventions DME instruction;Gait training;Functional mobility training;Therapeutic activities;Therapeutic exercise;Balance training;Patient/family education;Cognitive remediation    PT Goals (Current goals can be found in the Care Plan section)  Acute Rehab PT Goals Patient Stated Goal: unable PT Goal Formulation: With patient/family Time For Goal Achievement: 12/22/19 Potential to Achieve Goals: Fair    Frequency Min 3X/week   Barriers to discharge        Co-evaluation               AM-PAC PT "6 Clicks" Mobility  Outcome Measure Help needed turning from your back to your side while in a flat bed without using bedrails?: Total Help needed moving from lying on your back to sitting on the side of a flat bed without using bedrails?: A Little Help needed moving to and from a bed to a chair (including a wheelchair)?: A Little Help needed standing up from a chair using your arms (e.g., wheelchair or bedside chair)?: A Lot Help needed to walk in hospital room?: A Little Help needed climbing 3-5 steps with a railing? : A Lot 6 Click Score: 14    End of Session Equipment Utilized During Treatment: Gait belt;Other (comment) (sling) Activity Tolerance: Patient tolerated treatment well Patient left: in bed;with call bell/phone within reach;with bed alarm set Nurse Communication: Mobility status PT  Visit Diagnosis: Pain;Difficulty in walking, not elsewhere classified (R26.2);Unsteadiness on feet (R26.81) Pain - Right/Left: Right Pain - part of body: Shoulder    Time: 8413-2440 PT Time Calculation (min) (ACUTE ONLY): 23 min   Charges:   PT Evaluation $PT Eval Moderate Complexity: 1 Mod PT Treatments $Therapeutic Activity: 8-22 mins          Wyona Almas, PT, DPT Acute Rehabilitation Services Pager [phone] Office [phone]   Deno Etienne 12/08/2019, 3:13 PM

## 2019-12-08 NOTE — Plan of Care (Signed)
  Problem: Education: Goal: Knowledge of General Education information will improve Description: Including pain rating scale, medication(s)/side effects and non-pharmacologic comfort measures Outcome: Progressing   Problem: Health Behavior/Discharge Planning: Goal: Ability to manage health-related needs will improve Outcome: Progressing   Problem: Clinical Measurements: Goal: Ability to maintain clinical measurements within normal limits will improve Outcome: Progressing   Problem: Activity: Goal: Risk for activity intolerance will decrease Outcome: Progressing   Problem: Nutrition: Goal: Adequate nutrition will be maintained Outcome: Progressing   Problem: Elimination: Goal: Will not experience complications related to bowel motility Outcome: Progressing   Problem: Pain Managment: Goal: General experience of comfort will improve Outcome: Progressing   Problem: Safety: Goal: Ability to remain free from injury will improve Outcome: Progressing   

## 2019-12-08 NOTE — Evaluation (Addendum)
Clinical/Bedside Swallow Evaluation Patient Details  Name: Jennifer Rocha MRN: [id number] Date of Birth: Mar 08, 1928  Today's Date: 12/08/2019 Time: SLP Start Time (ACUTE ONLY): 1335 SLP Stop Time (ACUTE ONLY): 1403 SLP Time Calculation (min) (ACUTE ONLY): 28 min  Past Medical History:  Past Medical History:  Diagnosis Date  . Alcohol dependence ([place])   . HLD (hyperlipidemia)   . Hypertension   . Left heart failure ([place])   . Non-ischemic cardiomyopathy ([place])    with EF 20% improving to 50%   Past Surgical History:  Past Surgical History:  Procedure Laterality Date  . ANKLE SURGERY Right   . APPENDECTOMY  1941  . TONSILLECTOMY     HPI:  Pt is a 86 yo female presenting s/p fall with prolonged downtime. Admitted with rhabdomyolysis and humeral head fx. Per chart, family reported other recent falls and concern for increased confusion and memory loss. Family also reports a h/o coughing with solids PTA, although never evaluated. CXR and CT Chest both show clear lungs. PMH includes: CHF, HTN, HLD, ETOH dependence   Assessment / Plan / Recommendation Clinical Impression  Pt has generalized weakness with weak labial seal and incomplete mastication. Of note, she also presents with imprecise articulation and minimal verbal output (per family, RN aware). Initially there was minimal concern for airway protection, but as trials progressed and after she tried a small piece of solid food, there was an increase in coughing. Intermittent "gurgling" sound was also noted post-swallow. Given her h/o coughing with solid foods that family reports as ongoing for several years, I suspect a primary esophageal component; however, in light of acute deconditioning and altered mentation, I am concerned for at least secondary pharyngeal impact.   Educated pt/family on potential for MBS to better evaluate swallowing but pt said that she is "through" for today. Instead, pt/family are agreeable to downgrade diet to Dys  1 solids and thin liquids. Would provide careful use of aspiration and esophageal precautions, including a fully upright posture that is maintained for at least 30 minutes after meals. Would use small bites/sips while alternating solids/liquids and try to take POs in smaller, more frequent amounts at a time. Pt/family were both educated on the above as well as potential to complete MBS if desired.   SLP Visit Diagnosis: Dysphagia, unspecified (R13.10)    Aspiration Risk  Moderate aspiration risk    Diet Recommendation Dysphagia 1 (Puree);Thin liquid   Liquid Administration via: Cup;Straw Medication Administration: Crushed with puree Supervision: Staff to assist with self feeding;Full supervision/cueing for compensatory strategies Compensations: Minimize environmental distractions;Slow rate;Small sips/bites;Follow solids with liquid Postural Changes: Seated upright at 90 degrees;Remain upright for at least 30 minutes after po intake    Other  Recommendations Oral Care Recommendations: Oral care QID   Follow up Recommendations Skilled Nursing facility      Frequency and Duration min 2x/week  2 weeks       Prognosis Prognosis for Safe Diet Advancement: Fair Barriers to Reach Goals: Time post onset;Cognitive deficits      Swallow Study   General HPI: Pt is a 86 yo female presenting s/p fall with prolonged downtime. Admitted with rhabdomyolysis and humeral head fx. Per chart, family reported other recent falls and concern for increased confusion and memory loss. Family also reports a h/o coughing with solids PTA, although never evaluated. CXR and CT Chest both show clear lungs. PMH includes: CHF, HTN, HLD, ETOH dependence Type of Study: Bedside Swallow Evaluation Previous Swallow Assessment: none in chart  Diet Prior to this Study: Regular;Thin liquids Temperature Spikes Noted: No Respiratory Status: Room air History of Recent Intubation: No Behavior/Cognition:  Alert;Cooperative;Requires cueing Oral Cavity Assessment: Dry Oral Care Completed by SLP: No Oral Cavity - Dentition: Adequate natural dentition Vision: Functional for self-feeding Self-Feeding Abilities: Able to feed self;Needs assist Patient Positioning: Upright in chair Baseline Vocal Quality: Normal Volitional Cough: Weak Volitional Swallow: Able to elicit (with increased time)    Oral/Motor/Sensory Function Overall Oral Motor/Sensory Function: Generalized oral weakness   Ice Chips Ice chips: Not tested   Thin Liquid Thin Liquid: Impaired Presentation: Cup;Self Fed;Straw Oral Phase Impairments: Reduced labial seal Oral Phase Functional Implications: Right anterior spillage;Left anterior spillage Pharyngeal  Phase Impairments: Cough - Immediate    Nectar Thick Nectar Thick Liquid: Not tested   Honey Thick Honey Thick Liquid: Not tested   Puree Puree: Within functional limits Presentation: Spoon;Self Fed   Solid     Solid: Impaired Presentation: Self Fed Oral Phase Impairments: Impaired mastication Pharyngeal Phase Impairments: Cough - Immediate      Osie Bond., M.A. [place] Pager [phone] Office [phone]  12/08/2019,2:51 PM

## 2019-12-08 NOTE — Progress Notes (Signed)
[facility] PROGRESS NOTE   Jennifer Rocha JJO:[id number] DOB: 31-Oct-1927 DOA: 12/07/2019  PCP: Reynold Bowen, MD  Brief History/Interval Summary: 86 y.o. female with medical history significant of chronic combined CHF; HTN; and HLD presenting with a fall on Saturday, down since.  Family not sure what happened.  The patient called her daughter and reported that she was unable to get up and started talking about trees and other things that didn't make sense.  Her son called Sunday and she didn't return the call.  Her daughter talked to her Saturday morning.  Her neighbors saw her go to church Sunday but she didn't pick up the Monday or Tuesday paper.  They think maybe she fell Sunday night.  She does not remember what happened.  Family is noticing the start of memory loss.  She fell last week.  When her daughter visited last week, she was having trouble remembering her son's wife's name.  She is repeating herself occasionally.  She does still drive, hasn't gotten lost so far as her daughter knows.  She leaves the garage door open occasionally.  ED Course:  [place] Saturday, finally able to call her daughter today.  Humeral neck fracture that bled into skin for days.  Mild rhabdo.  Dr. Marcelino Scot recommends splint and outpatient f/u.  Other trauma evaluation was negative.  Hgb 9.5.  Likely to need placement.  Increasing confusion over the last month or so.   Reason for Visit: Right humerus fracture.  Confusion.  Consultants: Phone consultation with orthopedist, Dr. Marcelino Scot  Procedures: None  Antibiotics: Anti-infectives (From admission, onward)   None      Subjective/Interval History: Patient noted to be somewhat confused.  Does not answer questions appropriately but then asks the question which seems reasonable.  Her son is at the bedside.  ROS: Patient denies any pain.  No chest pain or shortness of breath.    Assessment/Plan:  Rhabdomyolysis secondary to fall/trauma CK was noted  to be 2943.  Patient was given IV fluids.  Down to 1102 this morning.  We will cut back on IV fluids.  Recheck CK levels tomorrow.  Renal function is stable.  Monitor urine output.  Right humerus fracture This case was discussed with Dr. Marcelino Scot by the ED provider.  Plan is for sling and outpatient monitoring.  Avoid surgery as much as possible considering her advanced age and comorbidities.  Pain control.  Bowel regimen.  Mechanical fall Patient underwent multiple radiological studies yesterday including pelvic x-ray, x-ray of the left foot, right shoulder, CT scan of the head, CT scan of the cervical spine, CT scan of the maxillofacial area, CT scan of the chest abdomen and pelvis.  Apart from the injury to her humerus no other injuries were noted.  Altered mental status/acute metabolic encephalopathy According to family patient has been having some memory issues for the past few weeks to months.  At times she would ask good questions at the other time she would be very slow to respond.  CT scan done at the time of admission showed atrophy and chronic microvascular disease but no acute changes were noted.  She is not exhibiting any focal neurological deficits.  Patient likely has underlying cognitive impairment made worse by her injury, hospital stay, history of alcohol use.  Reorient daily.  Monitor closely.  Hypokalemia This is be repleted.  Magnesium is 2.2.  Macrocytic anemia Hemoglobin was 9.5 yesterday.  Noted to be 7.4 this morning.  No overt bleeding noted.  She does have a lot of bruising around her right chest area as well as right upper arm.  CT scan of the chest and abdomen and pelvis did not show any bleeding in the thoracic or abdominal cavities.  We will check anemia panel and TSH.  Essential hypertension Coreg being continued.  Cozaar is being held.  Chronic combined systolic and diastolic CHF No echocardiogram data in our system.  This is as per history.  Based on a stress test  done in 2017 her EF is between 45 to 54%.  Seems to be reasonably well compensated.  Cut back on IV fluids.  Hyperlipidemia On Lipitor  History of alcohol dependence Continue CIWA protocol.  She has chronic EtOH dependence.  Monitor closely for withdrawal.  This could also be contributing to her altered mental status.  Thiamine.  Goals of care Family raised questions of dementia at the time of admission.  Patient apparently lives by herself.  PT and OT evaluation.  Palliative care consulted for goals of care.  May need placement.   DVT Prophylaxis: SCDs Code Status: DNR Family Communication: Discussed with son who was at bedside Disposition Plan:  Status is: Inpatient  Remains inpatient appropriate because:Persistent severe electrolyte disturbances, Altered mental status and IV treatments appropriate due to intensity of illness or inability to take PO   Dispo: The patient is from: Home              Anticipated d/c is to: To be determined              Anticipated d/c date is: 2 days              Patient currently is not medically stable to d/c.     Medications:  Scheduled: . atorvastatin  40 mg Oral Daily  . carvedilol  6.25 mg Oral BID  . docusate sodium  100 mg Oral BID  . DULoxetine  30 mg Oral Daily  . folic acid  1 mg Oral Daily  . multivitamin with minerals  1 tablet Oral Daily  . potassium chloride  40 mEq Oral Q4H  . thiamine  100 mg Oral Daily   Or  . thiamine  100 mg Intravenous Daily   Continuous: . sodium chloride 125 mL/hr at 12/08/19 1318  . methocarbamol (ROBAXIN) IV     AOZ:HYQMVHQIONGEX **OR** acetaminophen, bisacodyl, LORazepam **OR** LORazepam, methocarbamol (ROBAXIN) IV, morphine injection, ondansetron **OR** ondansetron (ZOFRAN) IV, polyethylene glycol   Objective:  Vital Signs  Vitals:   12/07/19 2056 12/08/19 0528 12/08/19 0749 12/08/19 1000  BP: 136/61 (!) 142/44 137/64   Pulse: 92 83 84   Resp: 20 20 18    Temp: 97.7 F (36.5 C) 98.3 F  (36.8 C) 98.2 F (36.8 C)   TempSrc: Oral Oral Oral   SpO2: 94% 95% 95% 98%  Weight:      Height:        Intake/Output Summary (Last 24 hours) at 12/08/2019 1321 Last data filed at 12/08/2019 0400 Gross per 24 hour  Intake 1731.25 ml  Output --  Net 1731.25 ml   Filed Weights   12/07/19 0901  Weight: 65 kg    General appearance: Awake alert.  In no distress.  Distracted Resp: Clear to auscultation bilaterally.  Normal effort Bruising noted over the right chest area. Cardio: S1-S2 is normal regular.  No S3-S4.  No rubs murmurs or bruit GI: Abdomen is soft.  Nontender nondistended.  Bowel sounds are present normal.  No masses  organomegaly Extremities: Right arm in splint Neurologic:  No focal neurological deficits.    Lab Results:  Data Reviewed: I have personally reviewed following labs and imaging studies  CBC: Recent Labs  Lab 12/07/19 0841 12/07/19 0848 12/08/19 0512  WBC 12.4*  --  9.3  HGB 9.5* 10.2* 7.4*  HCT 28.8* 30.0* 23.0*  MCV 101.4*  --  106.5*  PLT 246  --  546    Basic Metabolic Panel: Recent Labs  Lab 12/07/19 0841 12/07/19 0848 12/07/19 1819 12/08/19 0512  NA 139 139 139 140  K 3.5 3.5 3.1* 3.0*  CL 100 100 103 105  CO2 25  --  25 27  GLUCOSE 131* 126* 121* 104*  BUN 23 25* 20 20  CREATININE 0.56 0.40* 0.50 0.49  CALCIUM 9.1  --  8.4* 8.3*  MG  --   --   --  2.2    GFR: Estimated Creatinine Clearance: 42.4 mL/min (by C-G formula based on SCr of 0.49 mg/dL).  Liver Function Tests: Recent Labs  Lab 12/07/19 0841  AST 92*  ALT 55*  ALKPHOS 63  BILITOT 2.2*  PROT 6.5  ALBUMIN 3.7     Coagulation Profile: Recent Labs  Lab 12/07/19 0841  INR 1.0    Cardiac Enzymes: Recent Labs  Lab 12/07/19 0841 12/08/19 0512  CKTOTAL 2,943* 1,102*     Recent Results (from the past 240 hour(s))  SARS Coronavirus 2 by RT PCR (hospital order, performed in [facility] hospital lab) Nasopharyngeal Nasopharyngeal Swab     Status: None    Collection Time: 12/07/19 11:39 AM   Specimen: Nasopharyngeal Swab  Result Value Ref Range Status   SARS Coronavirus 2 NEGATIVE NEGATIVE Final    Comment: (NOTE) SARS-CoV-2 target nucleic acids are NOT DETECTED.  The SARS-CoV-2 RNA is generally detectable in upper and lower respiratory specimens during the acute phase of infection. The lowest concentration of SARS-CoV-2 viral copies this assay can detect is 250 copies / mL. A negative result does not preclude SARS-CoV-2 infection and should not be used as the sole basis for treatment or other patient management decisions.  A negative result may occur with improper specimen collection / handling, submission of specimen other than nasopharyngeal swab, presence of viral mutation(s) within the areas targeted by this assay, and inadequate number of viral copies (<250 copies / mL). A negative result must be combined with clinical observations, patient history, and epidemiological information.  Fact Sheet for Patients:   StrictlyIdeas.no  Fact Sheet for Healthcare Providers: BankingDealers.co.za  This test is not yet approved or  cleared by the [place] FDA and has been authorized for detection and/or diagnosis of SARS-CoV-2 by FDA under an Emergency Use Authorization (EUA).  This EUA will remain in effect (meaning this test can be used) for the duration of the COVID-19 declaration under Section 564(b)(1) of the Act, 21 U.S.C. section 360bbb-3(b)(1), unless the authorization is terminated or revoked sooner.  Performed at [facility], [place] [address] [place], [place] [address]       Radiology Studies: CT HEAD WO CONTRAST  Result Date: 12/07/2019 CLINICAL DATA:  Fall EXAM: CT HEAD WITHOUT CONTRAST TECHNIQUE: Contiguous axial images were obtained from the base of the skull through the vertex without intravenous contrast. COMPARISON:  None. FINDINGS: Brain: There is atrophy  and chronic small vessel disease changes. No acute intracranial abnormality. Specifically, no hemorrhage, hydrocephalus, mass lesion, acute infarction, or significant intracranial injury. Vascular: No hyperdense vessel or unexpected calcification.  Skull: No acute calvarial abnormality. Sinuses/Orbits: Visualized paranasal sinuses and mastoids clear. Orbital soft tissues unremarkable. Other: None IMPRESSION: Atrophy, chronic microvascular disease. No acute intracranial abnormality. Electronically Signed   By: Rolm Baptise M.D.   On: 12/07/2019 10:17   CT CHEST W CONTRAST  Result Date: 12/07/2019 CLINICAL DATA:  Status post fall. EXAM: CT CHEST, ABDOMEN, AND PELVIS WITH CONTRAST TECHNIQUE: Multidetector CT imaging of the chest, abdomen and pelvis was performed following the standard protocol during bolus administration of intravenous contrast. CONTRAST:  116mL OMNIPAQUE IOHEXOL 300 MG/ML  SOLN COMPARISON:  None. FINDINGS: CT CHEST FINDINGS Cardiovascular: Atherosclerosis of thoracic aorta is noted without aneurysm or dissection. Normal cardiac size. No pericardial effusion is noted. Mediastinum/Nodes: Moderate size sliding-type hiatal hernia is noted. Thyroid gland is unremarkable. No adenopathy is noted. Lungs/Pleura: Lungs are clear. No pleural effusion or pneumothorax. Musculoskeletal: Severely comminuted and displaced proximal right humeral head and neck fracture is noted. CT ABDOMEN PELVIS FINDINGS Hepatobiliary: No gallstones or biliary dilatation is noted. 1.6 x 1.1 cm enhancing abnormality is noted in dome of right hepatic lobe which appears to have both supplying portal branch and draining hepatic vein, suggesting portosystemic shunt. Pancreas: Unremarkable. No pancreatic ductal dilatation or surrounding inflammatory changes. Spleen: Normal in size without focal abnormality. Adrenals/Urinary Tract: Adrenal glands are unremarkable. Kidneys are normal, without renal calculi, focal lesion, or hydronephrosis.  Bladder is unremarkable. Stomach/Bowel: There is no evidence of bowel obstruction or inflammation. Diverticulosis is noted throughout the colon without inflammation. Status post appendectomy. Vascular/Lymphatic: Aortic atherosclerosis. No enlarged abdominal or pelvic lymph nodes. Reproductive: Uterus and bilateral adnexa are unremarkable. Other: No abdominal wall hernia or abnormality. No abdominopelvic ascites. Musculoskeletal: No acute or significant osseous findings. IMPRESSION: 1. Severely comminuted and displaced proximal right humeral head and neck fracture is noted. 2. Moderate size sliding-type hiatal hernia. 3. 1.6 x 1.1 cm enhancing abnormality is noted in dome of right hepatic lobe which appears to have both supplying portal branch and draining hepatic vein, suggesting portosystemic shunt. 4. Diverticulosis is noted throughout the colon without inflammation. Aortic Atherosclerosis (ICD10-I70.0). Electronically Signed   By: Marijo Conception M.D.   On: 12/07/2019 10:35   CT CERVICAL SPINE WO CONTRAST  Result Date: 12/07/2019 CLINICAL DATA:  Fall, neck trauma EXAM: CT CERVICAL SPINE WITHOUT CONTRAST TECHNIQUE: Multidetector CT imaging of the cervical spine was performed without intravenous contrast. Multiplanar CT image reconstructions were also generated. COMPARISON:  None. FINDINGS: Alignment: Normal Skull base and vertebrae: No acute fracture. No primary bone lesion or focal pathologic process. Soft tissues and spinal canal: No prevertebral fluid or swelling. No visible canal hematoma. Disc levels: Diffuse degenerative facet disease bilaterally. Disc spaces maintained. Upper chest: No acute findings Other: None IMPRESSION: Diffuse degenerative facet disease.  No acute bony abnormality. Electronically Signed   By: Rolm Baptise M.D.   On: 12/07/2019 10:19   CT ABDOMEN PELVIS W CONTRAST  Result Date: 12/07/2019 CLINICAL DATA:  Status post fall. EXAM: CT CHEST, ABDOMEN, AND PELVIS WITH CONTRAST  TECHNIQUE: Multidetector CT imaging of the chest, abdomen and pelvis was performed following the standard protocol during bolus administration of intravenous contrast. CONTRAST:  184mL OMNIPAQUE IOHEXOL 300 MG/ML  SOLN COMPARISON:  None. FINDINGS: CT CHEST FINDINGS Cardiovascular: Atherosclerosis of thoracic aorta is noted without aneurysm or dissection. Normal cardiac size. No pericardial effusion is noted. Mediastinum/Nodes: Moderate size sliding-type hiatal hernia is noted. Thyroid gland is unremarkable. No adenopathy is noted. Lungs/Pleura: Lungs are clear. No pleural effusion or pneumothorax. Musculoskeletal:  Severely comminuted and displaced proximal right humeral head and neck fracture is noted. CT ABDOMEN PELVIS FINDINGS Hepatobiliary: No gallstones or biliary dilatation is noted. 1.6 x 1.1 cm enhancing abnormality is noted in dome of right hepatic lobe which appears to have both supplying portal branch and draining hepatic vein, suggesting portosystemic shunt. Pancreas: Unremarkable. No pancreatic ductal dilatation or surrounding inflammatory changes. Spleen: Normal in size without focal abnormality. Adrenals/Urinary Tract: Adrenal glands are unremarkable. Kidneys are normal, without renal calculi, focal lesion, or hydronephrosis. Bladder is unremarkable. Stomach/Bowel: There is no evidence of bowel obstruction or inflammation. Diverticulosis is noted throughout the colon without inflammation. Status post appendectomy. Vascular/Lymphatic: Aortic atherosclerosis. No enlarged abdominal or pelvic lymph nodes. Reproductive: Uterus and bilateral adnexa are unremarkable. Other: No abdominal wall hernia or abnormality. No abdominopelvic ascites. Musculoskeletal: No acute or significant osseous findings. IMPRESSION: 1. Severely comminuted and displaced proximal right humeral head and neck fracture is noted. 2. Moderate size sliding-type hiatal hernia. 3. 1.6 x 1.1 cm enhancing abnormality is noted in dome of right  hepatic lobe which appears to have both supplying portal branch and draining hepatic vein, suggesting portosystemic shunt. 4. Diverticulosis is noted throughout the colon without inflammation. Aortic Atherosclerosis (ICD10-I70.0). Electronically Signed   By: Marijo Conception M.D.   On: 12/07/2019 10:35   DG Pelvis Portable  Result Date: 12/07/2019 CLINICAL DATA:  Recent fall. EXAM: PORTABLE PELVIS 1-2 VIEWS COMPARISON:  None. FINDINGS: Negative for pelvic fracture. Mild degenerative change left hip with joint space narrowing and spurring. No acute bony lesion. IMPRESSION: No acute abnormality pain Electronically Signed   By: Franchot Gallo M.D.   On: 12/07/2019 09:11   DG Chest Port 1 View  Result Date: 12/07/2019 CLINICAL DATA:  Fall several days ago. EXAM: PORTABLE CHEST 1 VIEW COMPARISON:  None. FINDINGS: Lungs are well aerated. No infiltrate effusion or edema. Heart size upper normal. Comminuted fracture proximal right humerus. Multiple chronic right rib fractures. IMPRESSION: No acute cardiopulmonary abnormality Comminuted fracture proximal right humerus. Electronically Signed   By: Franchot Gallo M.D.   On: 12/07/2019 09:10   DG Shoulder Right Port  Result Date: 12/07/2019 CLINICAL DATA:  Recent fall. EXAM: PORTABLE RIGHT SHOULDER COMPARISON:  10/02/2013 FINDINGS: Comminuted fracture right humeral neck and humeral head. Ventral angulation. Multiple comminuted bone fragments. AC joint intact Chronic right rib fractures. IMPRESSION: Comminuted and angulated fracture proximal right humerus. Electronically Signed   By: Franchot Gallo M.D.   On: 12/07/2019 09:12   DG Foot Complete Left  Result Date: 12/07/2019 CLINICAL DATA:  Left foot pain. Fell 2 days ago. EXAM: LEFT FOOT - COMPLETE 3+ VIEW COMPARISON:  None. FINDINGS: Moderate age related degenerative changes. No acute fracture is identified. Large calcaneal heel spur noted. IMPRESSION: Age related degenerative changes but no acute fracture.  Electronically Signed   By: Marijo Sanes M.D.   On: 12/07/2019 09:13   CT MAXILLOFACIAL WO CONTRAST  Result Date: 12/07/2019 CLINICAL DATA:  Fall, left facial bruising EXAM: CT MAXILLOFACIAL WITHOUT CONTRAST TECHNIQUE: Multidetector CT imaging of the maxillofacial structures was performed. Multiplanar CT image reconstructions were also generated. COMPARISON:  None. FINDINGS: Osseous: No fracture or mandibular dislocation. No destructive process. Orbits: Negative. No traumatic or inflammatory finding. Sinuses: Clear Soft tissues: Negative Limited intracranial: No acute findings IMPRESSION: No facial or orbital fracture. Electronically Signed   By: Rolm Baptise M.D.   On: 12/07/2019 10:18       LOS: 1 day   [facility]  Pager on [email or web address]  12/08/2019, 1:21 PM

## 2019-12-09 ENCOUNTER — Inpatient Hospital Stay (HOSPITAL_COMMUNITY): Payer: Medicare Other

## 2019-12-09 DIAGNOSIS — I6389 Other cerebral infarction: Secondary | ICD-10-CM

## 2019-12-09 DIAGNOSIS — I639 Cerebral infarction, unspecified: Secondary | ICD-10-CM

## 2019-12-09 LAB — ECHOCARDIOGRAM COMPLETE
Height: 63 in
S' Lateral: 3.3 cm
Weight: 2292.78 oz

## 2019-12-09 LAB — COMPREHENSIVE METABOLIC PANEL
ALT: 39 U/L (ref 0–44)
AST: 44 U/L — ABNORMAL HIGH (ref 15–41)
Albumin: 2.8 g/dL — ABNORMAL LOW (ref 3.5–5.0)
Alkaline Phosphatase: 50 U/L (ref 38–126)
Anion gap: 7 (ref 5–15)
BUN: 14 mg/dL (ref 8–23)
CO2: 25 mmol/L (ref 22–32)
Calcium: 8.1 mg/dL — ABNORMAL LOW (ref 8.9–10.3)
Chloride: 108 mmol/L (ref 98–111)
Creatinine, Ser: 0.41 mg/dL — ABNORMAL LOW (ref 0.44–1.00)
GFR calc Af Amer: >60 mL/min (ref >60)
GFR calc non Af Amer: >60 mL/min (ref >60)
Glucose, Bld: 101 mg/dL — ABNORMAL HIGH (ref 70–99)
Potassium: 3.7 mmol/L (ref 3.5–5.1)
Sodium: 140 mmol/L (ref 135–145)
Total Bilirubin: 1.4 mg/dL — ABNORMAL HIGH (ref 0.3–1.2)
Total Protein: 5 g/dL — ABNORMAL LOW (ref 6.5–8.1)

## 2019-12-09 LAB — VITAMIN B12: Vitamin B-12: 301 pg/mL (ref 180–914)

## 2019-12-09 LAB — CBC
HCT: 22.5 % — ABNORMAL LOW (ref 36.0–46.0)
Hemoglobin: 6.9 g/dL — CL (ref 12.0–15.0)
MCH: 33 pg (ref 26.0–34.0)
MCHC: 30.7 g/dL (ref 30.0–36.0)
MCV: 107.7 fL — ABNORMAL HIGH (ref 80.0–100.0)
Platelets: 230 10*3/uL (ref 150–400)
RBC: 2.09 MIL/uL — ABNORMAL LOW (ref 3.87–5.11)
RDW: 13.8 % (ref 11.5–15.5)
WBC: 9 10*3/uL (ref 4.0–10.5)
nRBC: 0.2 % (ref 0.0–0.2)

## 2019-12-09 LAB — RETICULOCYTES
Immature Retic Fract: 37.2 % — ABNORMAL HIGH (ref 2.3–15.9)
RBC.: 2.05 MIL/uL — ABNORMAL LOW (ref 3.87–5.11)
Retic Count, Absolute: 114.4 10*3/uL (ref 19.0–186.0)
Retic Ct Pct: 5.6 % — ABNORMAL HIGH (ref 0.4–3.1)

## 2019-12-09 LAB — FERRITIN: Ferritin: 131 ng/mL (ref 11–307)

## 2019-12-09 LAB — CK: Total CK: 674 U/L — ABNORMAL HIGH (ref 38–234)

## 2019-12-09 LAB — RPR: RPR Ser Ql: NONREACTIVE

## 2019-12-09 LAB — HEMOGLOBIN AND HEMATOCRIT, BLOOD
HCT: 27.3 % — ABNORMAL LOW (ref 36.0–46.0)
Hemoglobin: 8.6 g/dL — ABNORMAL LOW (ref 12.0–15.0)

## 2019-12-09 LAB — TSH: TSH: 5.783 u[IU]/mL — ABNORMAL HIGH (ref 0.350–4.500)

## 2019-12-09 LAB — PREPARE RBC (CROSSMATCH)

## 2019-12-09 LAB — ABO/RH: ABO/RH(D): A POS

## 2019-12-09 LAB — IRON AND TIBC
Iron: 40 ug/dL (ref 28–170)
Saturation Ratios: 15 % (ref 10.4–31.8)
TIBC: 260 ug/dL (ref 250–450)
UIBC: 220 ug/dL

## 2019-12-09 LAB — FOLATE: Folate: 10.6 ng/mL (ref >5.9)

## 2019-12-09 MED ORDER — STROKE: EARLY STAGES OF RECOVERY BOOK
Freq: Once | Status: AC
  Filled 2019-12-09: qty 1

## 2019-12-09 MED ORDER — IOHEXOL 350 MG/ML SOLN
75.0000 mL | Freq: Once | INTRAVENOUS | Status: AC | PRN
  Administered 2019-12-09: 75 mL via INTRAVENOUS

## 2019-12-09 MED ORDER — SODIUM CHLORIDE 0.9% IV SOLUTION: Freq: Once | INTRAVENOUS | Status: DC

## 2019-12-09 NOTE — Progress Notes (Signed)
  Speech Language Pathology Treatment: Dysphagia  Patient Details Name: Jennifer Rocha MRN: [id number] DOB: April 08, 1928 Today's Date: 12/09/2019 Time: 1420-1450 SLP Time Calculation (min) (ACUTE ONLY): 30 min  Assessment / Plan / Recommendation Clinical Impression  Pt was seen for skilled ST intervention targeting goals for diet tolerance. Pt underwent MRI today, which revealed "Fairly extensive acute/early subacute left MCA territory infarct.Marland KitchenMarland KitchenAdditional small acute infarct within the paramedian right frontal lobe.Marland KitchenMarland KitchenMultifocal small volume acute/early subacute subarachnoid hemorrhage.Marland KitchenMarland Kitchenpresent on the prior head CT of 12/07/2019 ".  Pt was seen during lunch with pureed solids and thin liquids. Cough noted after thin liquid trials when using straw. Pt appeared to tolerate pureed textures, but accepted very little of her lunch. Pt declined trial of solid texture. Safe swallow precautions reviewed with pt/family and left to be taken to next room. MBS continues to be recommended to objectively assess swallow function and safety, given history of dysphagia, age, cognitive decline, and new diagnosis of stroke. SLP will follow for assessment of diet tolerance and continued education.    HPI HPI: Pt is a 86 yo female presenting s/p fall with prolonged downtime. Admitted with rhabdomyolysis and humeral head fx. Per chart, family reported other recent falls and concern for increased confusion and memory loss. Family also reports a h/o coughing with solids PTA, although never evaluated. CXR and CT Chest both show clear lungs. PMH includes: CHF, HTN, HLD, ETOH dependence      SLP Plan  Continue with current plan of care;MBS recommended       Recommendations  Diet recommendations: Dysphagia 1 (puree);Thin liquid Liquids provided via: Cup;Straw Medication Administration: Crushed with puree Supervision: Staff to assist with self feeding;Full supervision/cueing for compensatory strategies Compensations:  Minimize environmental distractions;Slow rate;Small sips/bites;Follow solids with liquid Postural Changes and/or Swallow Maneuvers: Seated upright 90 degrees;Upright 30-60 min after meal                Oral Care Recommendations: Oral care QID Follow up Recommendations: Skilled Nursing facility;24 hour supervision/assistance SLP Visit Diagnosis: Dysphagia, unspecified (R13.10) Plan: Continue with current plan of care;MBS       GO               Neha Waight B. Quentin Ore, [facility], [place] Speech Language Pathologist Office: [phone]  [name] 12/09/2019, 2:57 PM

## 2019-12-09 NOTE — Progress Notes (Signed)
[facility] PROGRESS NOTE   Jennifer Rocha JJK:[id number] DOB: [birth year] DOA: 12/07/2019  PCP: Reynold Bowen, MD  Brief History/Interval Summary: 86 y.o. female with medical history significant of chronic combined CHF; HTN; and HLD presenting with a fall on Saturday, down since.  Family not sure what happened.  The patient called her daughter and reported that she was unable to get up and started talking about trees and other things that didn't make sense.  Her son called Sunday and she didn't return the call.  Her daughter talked to her Saturday morning.  Her neighbors saw her go to church Sunday but she didn't pick up the Monday or Tuesday paper.  They think maybe she fell Sunday night.  She does not remember what happened.  Family is noticing the start of memory loss.  She fell last week.  When her daughter visited last week, she was having trouble remembering her son's wife's name.  She is repeating herself occasionally.  She does still drive, hasn't gotten lost so far as her daughter knows.  She leaves the garage door open occasionally.  ED Course:  [place] Saturday, finally able to call her daughter today.  Humeral neck fracture that bled into skin for days.  Mild rhabdo.  Dr. Marcelino Scot recommends splint and outpatient f/u.  Other trauma evaluation was negative.  Hgb 9.5.  Likely to need placement.  Increasing confusion over the last month or so.   Reason for Visit: Right humerus fracture.  Acute blood loss anemia.  Acute stroke.  Consultants: Phone consultation with orthopedist, Dr. Marcelino Scot  Procedures: None  Antibiotics: Anti-infectives (From admission, onward)   None      Subjective/Interval History: Patient remains confused this morning.  Unable to verbalize.  Her daughter is at the bedside.       Assessment/Plan:  Acute stroke Due to persistent confusion and difficulty with speech MRI of the brain was done this morning which shows an extensive left MCA infarct.  There  was also a small infarct in the right frontal lobe and concern was also raised for subarachnoid hemorrhage.  Will consult neurology.  This could have been the reason for her initial fall although patient was never found to have any focal deficits.  Not on no heparin products.  No antiplatelets noted.  Initiate stroke order set.  Rhabdomyolysis secondary to fall/trauma CK was noted to be 2943.  Patient was given IV fluids.  CK level is down to 674.  Decrease rate of IV fluids.  Renal function is stable.    Right humerus fracture This case was discussed with Dr. Marcelino Scot by the ED provider.  Plan is for sling and outpatient monitoring.  Avoid surgery as much as possible considering her advanced age and comorbidities.  Pain control.  Bowel regimen.  Mechanical fall Patient underwent multiple radiological studies yesterday including pelvic x-ray, x-ray of the left foot, right shoulder, CT scan of the head, CT scan of the cervical spine, CT scan of the maxillofacial area, CT scan of the chest abdomen and pelvis.  Apart from the injury to her humerus no other injuries were noted.  Altered mental status/acute metabolic encephalopathy According to family patient has been having some memory issues for the past few weeks to months.  At times she would ask good questions at the other time she would be very slow to respond.  CT scan done at the time of admission showed atrophy and chronic microvascular disease but no acute changes were noted.  See above under "Acute stroke."  Hypokalemia Potassium is normal today.  Magnesium was 2.2 yesterday.  Macrocytic anemia Hemoglobin has dropped from 9.52 days ago to 6.9 today.  Most likely due to injury sustained as a result of her fall.  She has a lot of bruising in her right chest.  No bleeding was noted on any of the imaging studies including CT scan of the chest abdomen and pelvis.  She will be transfused 1 unit of blood today.  Recheck hemoglobin tomorrow.  Anemia  panel reviewed.  Ferritin 131.  Iron 40.  TIBC 260.  Folate 10.6 and vitamin B12 301.  TSH noted to be 5.7.  Will check free T4 tomorrow.   Essential hypertension Blood pressure reasonably well controlled.  Due to acute stroke we may need to allow permissive hypertension.  Cozaar is already being held.  We will stop the carvedilol as well.    Chronic combined systolic and diastolic CHF No echocardiogram data in our system.  This is as per history.  Based on a stress test done in 2017 her EF is between 45 to 54%.  Seems to be reasonably well compensated.  Decrease IV fluids.  Hyperlipidemia On Lipitor  History of alcohol dependence Continue CIWA protocol.  She has chronic EtOH dependence.  Monitor closely for withdrawal.  This could also be contributing to her altered mental status.  Thiamine.  Goals of care Family raised questions of dementia at the time of admission.  Patient apparently lives by herself.  PT and OT evaluation.  Palliative care consulted for goals of care.  May need placement.   DVT Prophylaxis: SCDs Code Status: DNR Family Communication: Discussed with patient's daughter who was at the bedside. Disposition Plan:  Status is: Inpatient  Remains inpatient appropriate because:Altered mental status and IV treatments appropriate due to intensity of illness or inability to take PO   Dispo:  Patient From: Home  Planned Disposition: To be determined  Expected discharge date: 12/10/19  Medically stable for discharge: No       Medications:  Scheduled: . sodium chloride   Intravenous Once  . acetaminophen  650 mg Oral Q6H  . atorvastatin  40 mg Oral Daily  . carvedilol  6.25 mg Oral BID  . docusate sodium  100 mg Oral BID  . DULoxetine  30 mg Oral Daily  . folic acid  1 mg Oral Daily  . mouth rinse  15 mL Mouth Rinse BID  . multivitamin with minerals  1 tablet Oral Daily  . thiamine  100 mg Oral Daily   Or  . thiamine  100 mg Intravenous Daily    Continuous: . sodium chloride 75 mL/hr at 12/08/19 1343  . methocarbamol (ROBAXIN) IV     YIR:[id number] **OR** acetaminophen, bisacodyl, LORazepam **OR** LORazepam, methocarbamol (ROBAXIN) IV, morphine injection, ondansetron **OR** ondansetron (ZOFRAN) IV, polyethylene glycol   Objective:  Vital Signs  Vitals:   12/08/19 2300 12/09/19 0300 12/09/19 0941 12/09/19 1017  BP: 132/70 137/68 (!) 172/118 (!) 149/64  Pulse: 76 71 87 87  Resp: 15 17 20    Temp: 98 F (36.7 C) 98.8 F (37.1 C) 98.3 F (36.8 C)   TempSrc: Axillary Axillary Oral   SpO2: 94% 95% 97%   Weight:      Height:        Intake/Output Summary (Last 24 hours) at 12/09/2019 1147 Last data filed at 12/08/2019 1400 Gross per 24 hour  Intake 1235.83 ml  Output --  Net 1235.83 ml  Filed Weights   12/07/19 0901  Weight: 65 kg    General appearance: Awake alert.  Noted to have difficulty speaking.  In no distress.  Seems distracted. Resp: Clear to auscultation bilaterally.  Normal effort Cardio: S1-S2 is normal regular.  No S3-S4.  No rubs murmurs or bruit GI: Abdomen is soft.  Nontender nondistended.  Bowel sounds are present normal.  No masses organomegaly Extremities: No edema.  Full range of motion of lower extremities. Neurologic: Alert.  No facial asymmetry.  Moving all her extremities equally.     Lab Results:  Data Reviewed: I have personally reviewed following labs and imaging studies  CBC: Recent Labs  Lab 12/07/19 0841 12/07/19 0848 12/08/19 0512 12/09/19 0241  WBC 12.4*  --  9.3 9.0  HGB 9.5* 10.2* 7.4* 6.9*  HCT 28.8* 30.0* 23.0* 22.5*  MCV 101.4*  --  106.5* 107.7*  PLT 246  --  200 161    Basic Metabolic Panel: Recent Labs  Lab 12/07/19 0841 12/07/19 0848 12/07/19 1819 12/08/19 0512 12/09/19 0241  NA 139 139 139 140 140  K 3.5 3.5 3.1* 3.0* 3.7  CL 100 100 103 105 108  CO2 25  --  25 27 25   GLUCOSE 131* 126* 121* 104* 101*  BUN 23 25* 20 20 14   CREATININE 0.56  0.40* 0.50 0.49 0.41*  CALCIUM 9.1  --  8.4* 8.3* 8.1*  MG  --   --   --  2.2  --     GFR: Estimated Creatinine Clearance: 42.4 mL/min (A) (by C-G formula based on SCr of 0.41 mg/dL (L)).  Liver Function Tests: Recent Labs  Lab 12/07/19 0841 12/09/19 0241  AST 92* 44*  ALT 55* 39  ALKPHOS 63 50  BILITOT 2.2* 1.4*  PROT 6.5 5.0*  ALBUMIN 3.7 2.8*     Coagulation Profile: Recent Labs  Lab 12/07/19 0841  INR 1.0    Cardiac Enzymes: Recent Labs  Lab 12/07/19 0841 12/08/19 0512 12/09/19 0241  CKTOTAL 2,943* 1,102* 674*     Recent Results (from the past 240 hour(s))  SARS Coronavirus 2 by RT PCR (hospital order, performed in [facility] hospital lab) Nasopharyngeal Nasopharyngeal Swab     Status: None   Collection Time: 12/07/19 11:39 AM   Specimen: Nasopharyngeal Swab  Result Value Ref Range Status   SARS Coronavirus 2 NEGATIVE NEGATIVE Final    Comment: (NOTE) SARS-CoV-2 target nucleic acids are NOT DETECTED.  The SARS-CoV-2 RNA is generally detectable in upper and lower respiratory specimens during the acute phase of infection. The lowest concentration of SARS-CoV-2 viral copies this assay can detect is 250 copies / mL. A negative result does not preclude SARS-CoV-2 infection and should not be used as the sole basis for treatment or other patient management decisions.  A negative result may occur with improper specimen collection / handling, submission of specimen other than nasopharyngeal swab, presence of viral mutation(s) within the areas targeted by this assay, and inadequate number of viral copies (<250 copies / mL). A negative result must be combined with clinical observations, patient history, and epidemiological information.  Fact Sheet for Patients:   StrictlyIdeas.no  Fact Sheet for Healthcare Providers: BankingDealers.co.za  This test is not yet approved or  cleared by the [place] FDA  and has been authorized for detection and/or diagnosis of SARS-CoV-2 by FDA under an Emergency Use Authorization (EUA).  This EUA will remain in effect (meaning this test can be used) for the duration of the  COVID-19 declaration under Section 564(b)(1) of the Act, 21 U.S.C. section 360bbb-3(b)(1), unless the authorization is terminated or revoked sooner.  Performed at [facility], [place] [address], [place], [place] [address]       Radiology Studies: MR BRAIN WO CONTRAST  Result Date: 12/09/2019 CLINICAL DATA:  Neuro deficit, acute, stroke suspected. EXAM: MRI HEAD WITHOUT CONTRAST TECHNIQUE: Multiplanar, multiecho pulse sequences of the brain and surrounding structures were obtained without intravenous contrast. COMPARISON:  Head CT 12/07/2019 FINDINGS: Brain: Patient was unable to tolerate the full examination. As a result, only axial and coronal diffusion-weighted sequences, and axial T1 weighted sequence, axial SWI sequence and axial T2/FLAIR sequence could be obtained. The acquired sequences are motion degraded. Most notably, there is severe motion degradation of the sagittal T1 weighted sequence, moderate motion degradation of the axial SWI sequence and severe motion degradation of the axial T2/FLAIR sequence. There is fairly extensive and predominantly cortical restricted diffusion within the left MCA vascular territory, within the anterolateral left frontal lobe, left frontal operculum, left parietal lobe, left temporal lobe and left insula as well as subinsular region consistent with acute/early subacute infarction. There is an additional acute/early subacute infarct measuring 11 mm within the paramedian right frontal lobe (series 3, image 34). There is scattered small volume acute/early subacute subarachnoid hemorrhage, most notably along the interhemispheric fissure, left frontal lobe and within the left sylvian fissure. Additionally, there is small volume intraventricular hemorrhage  within the occipital horns bilaterally. Small volume subarachnoid and intraventricular hemorrhage was present on the head CT of 12/07/2019, although seen to better advantage on today's study. The subarachnoid hemorrhage within the interhemispheric fissure appears new from this prior exam. Mild generalized parenchymal atrophy. Mild chronic small vessel ischemic disease better appreciated on prior CT due to motion degradation on the current study. No intracranial mass is identified. No evidence of hydrocephalus.  No midline shift Vascular: Poorly assessed on the acquired sequences. Skull and upper cervical spine: No focal marrow lesion is identified within limitations of motion degradation Sinuses/Orbits: Orbits poorly assessed on the acquired sequences. Mild ethmoid sinus mucosal thickening. No appreciable significant mastoid effusion. These results were called by telephone at the time of interpretation on 12/09/2019 at 11:38 am to provider [facility] , who verbally acknowledged these results. IMPRESSION: Prematurely terminated and significantly motion degraded examination as described. Fairly extensive acute/early subacute left MCA territory infarct, as described. Additional small acute infarct within the paramedian right frontal lobe. Multifocal small volume acute/early subacute subarachnoid hemorrhage and intraventricular hemorrhage as outlined. In retrospect, small volume acute subarachnoid and intraventricular hemorrhage was present on the prior head CT of 12/07/2019, although is seen to better advantage on today's MRI. Subarachnoid hemorrhage within the interhemispheric fissure appears new from this prior exam. Stable mild generalized parenchymal atrophy and chronic small vessel ischemic disease. Electronically Signed   By: Kellie Simmering DO   On: 12/09/2019 11:39       LOS: 2 days   [facility] Pager on [email or web address]  12/09/2019, 11:47 AM

## 2019-12-09 NOTE — Progress Notes (Signed)
Pt completed 1 unit PRBC, vital signs taken and recorded, no reactions noted.

## 2019-12-09 NOTE — Progress Notes (Signed)
Patient arrived to unit, verified on telemetry

## 2019-12-09 NOTE — Consult Note (Signed)
Neurology Consultation  Reason for Consult: Stroke Referring Physician: Dr. Bonnielee Haff, [facility]  CC: Confusion, stroke on MRI  History is obtained from: Chart review, daughter [name] at bedside  HPI: Jennifer Rocha is a 86 y.o. female who has a past medical history of alcohol abuse, hypertension, hyperlipidemia, nonischemic cardiomyopathy with left heart failure, presented to the hospital for evaluation after a fall. She was admitted on 12/07/2019. Last time family had spoken to her was Saturday morning. According to H&P, neighbor saw her go to church and Sunday but she did not pick up her paper on Monday or Tuesday. Family thinks that she might of had a fall on Sunday. Patient unable to provide any history. She has had falls before and had another 1 week prior. After not hearing from her, family visited and found her to have difficulty with remembering family members names. She also had some repetition of same sentences. Lives alone, drives short distances herself. Family has some concerns for short-term memory issues. Patient herself unable to provide any history.  At [facility], she was evaluated in the ER, found to have a right humeral fracture that bled into the skin-it look like had been for a few days. She also had mild rhabdomyolysis. Orthopedics evaluated the patient, recommended a splint and outpatient follow-up. Trauma evaluation otherwise was negative. There is also mention in the note of increasing confusion over the last month.  MRI of the brain was performed today because the patient continued to appear confused, and it showed a left MCA territory subacute infarct as well as a cortical infarct in the right superior frontal lobe.  Also shown on the MRI is a small volume bilateral IVH and SAH in the interhemispheric fissure as well as left frontal sylvian fissure.  LKW: Saturday, 12/04/2019 tpa given?: no, outside the window Premorbid modified Rankin scale (mRS):  1  ROS: Unable to obtain due to patient's aphasia  Past Medical History:  Diagnosis Date  . Alcohol dependence ([place])   . HLD (hyperlipidemia)   . Hypertension   . Left heart failure ([place])   . Non-ischemic cardiomyopathy ([place])    with EF 20% improving to 50%   Family History  Adopted: Yes  Problem Relation Age of Onset  . Heart failure Mother   . Heart disease Brother   . Alcohol abuse Other        her daughter, addiction "gallops in our family"    Social History:   reports that she quit smoking about 66 years ago. She has never used smokeless tobacco. She reports current alcohol use of about 21.0 standard drinks of alcohol per week. She reports previous drug use.   Medications  Current Facility-Administered Medications:  .   stroke: mapping our early stages of recovery book, , Does not apply, Once, Bonnielee Haff, MD .  0.9 %  sodium chloride infusion (Manually program via Guardrails IV Fluids), , Intravenous, Once, Lang Snow, FNP .  [COMPLETED] sodium chloride 0.9 % bolus 1,000 mL, 1,000 mL, Intravenous, Once, Stopped at 12/07/19 1228 **AND** 0.9 %  sodium chloride infusion, , Intravenous, Continuous, Bonnielee Haff, MD, Last Rate: 75 mL/hr at 12/08/19 1343, Rate Change at 12/08/19 1343 .  acetaminophen (TYLENOL) tablet 650 mg, 650 mg, Oral, Q6H PRN, 650 mg at 12/08/19 0624 **OR** acetaminophen (TYLENOL) suppository 650 mg, 650 mg, Rectal, Q6H PRN, Karmen Bongo, MD .  acetaminophen (TYLENOL) tablet 650 mg, 650 mg, Oral, Q6H, Knox Royalty, NP, 650 mg at 12/09/19 1018 .  atorvastatin (LIPITOR) tablet 40 mg, 40 mg, Oral, Daily, Karmen Bongo, MD, 40 mg at 12/09/19 1018 .  bisacodyl (DULCOLAX) EC tablet 5 mg, 5 mg, Oral, Daily PRN, Karmen Bongo, MD .  docusate sodium (COLACE) capsule 100 mg, 100 mg, Oral, BID, Karmen Bongo, MD, 100 mg at 12/08/19 0950 .  DULoxetine (CYMBALTA) DR capsule 30 mg, 30 mg, Oral, Daily, Karmen Bongo, MD, 30 mg at 12/09/19 1019 .   folic acid (FOLVITE) tablet 1 mg, 1 mg, Oral, Daily, Karmen Bongo, MD, 1 mg at 12/09/19 1018 .  LORazepam (ATIVAN) tablet 1-4 mg, 1-4 mg, Oral, Q1H PRN **OR** LORazepam (ATIVAN) injection 1-4 mg, 1-4 mg, Intravenous, Q1H PRN, Karmen Bongo, MD .  MEDLINE mouth rinse, 15 mL, Mouth Rinse, BID, Bonnielee Haff, MD .  methocarbamol (ROBAXIN) 500 mg in dextrose 5 % 50 mL IVPB, 500 mg, Intravenous, Q6H PRN, Karmen Bongo, MD .  morphine 2 MG/ML injection 2 mg, 2 mg, Intravenous, Q2H PRN, Karmen Bongo, MD, 2 mg at 12/09/19 0319 .  multivitamin with minerals tablet 1 tablet, 1 tablet, Oral, Daily, Karmen Bongo, MD, 1 tablet at 12/09/19 1018 .  ondansetron (ZOFRAN) tablet 4 mg, 4 mg, Oral, Q6H PRN **OR** ondansetron (ZOFRAN) injection 4 mg, 4 mg, Intravenous, Q6H PRN, Karmen Bongo, MD .  polyethylene glycol (MIRALAX / GLYCOLAX) packet 17 g, 17 g, Oral, Daily PRN, Karmen Bongo, MD .  thiamine tablet 100 mg, 100 mg, Oral, Daily, 100 mg at 12/09/19 1018 **OR** thiamine (B-1) injection 100 mg, 100 mg, Intravenous, Daily, Karmen Bongo, MD  Exam: Current vital signs: BP (!) 142/66 (BP Location: Left Arm)   Pulse 79   Temp 97.7 F (36.5 C) (Oral)   Resp 15   Ht 5\' 3"  (1.6 m)   Wt 65 kg   SpO2 97%   BMI 25.38 kg/m  Vital signs in last 24 hours: Temp:  [97.7 F (36.5 C)-98.8 F (37.1 C)] 97.7 F (36.5 C) (07/15 1221) Pulse Rate:  [71-87] 79 (07/15 1221) Resp:  [15-20] 15 (07/15 1221) BP: (122-172)/(56-118) 142/66 (07/15 1221) SpO2:  [94 %-97 %] 97 % (07/15 1221) General: Awake alert in no distress HEENT: Normocephalic atraumatic Lungs: Clear Chest: Regular rate rhythm, bruising over the right shoulder onto the chest wall. Extremities: Right upper extremity in a splint. Trace edema bilateral lower extremities Neurologic exam She is awake, alert, appears to mimic some commands and follows simple hand gestures but not simple verbal commands. She is nonverbal. According to the  daughter, there was more verbal output in the past couple of days but that has deteriorated over the past 2 days. Cranial nerves: Pupils equal round reactive to light, extraocular movements appear intact but she seems to have diminished or no blink to threat from the right, normal blink to threat from the left, face appears symmetric. Motor exam: Right upper extremity is in a sling. She is able to keep left upper extremity antigravity without drift. She is also able to keep the left lower extremity antigravity without drift. Right lower extremity has a subtle drift. Sensory exam: To mild noxious stimulation, there is strong withdrawal of the left lower extremity but not so much of the right lower extremity. Coordination: Difficult to assess with her being not able to follow commands. Gait testing deferred for safety NIHSS 1a Level of Conscious.: 0 1b LOC Questions: 2 1c LOC Commands: 2 2 Best Gaze: 0 3 Visual: 1 4 Facial Palsy: 0 5a Motor Arm - left: 0 5b Motor  Arm - Right: UN (immobilized) 6a Motor Leg - Left: 0 6b Motor Leg - Right:1  7 Limb Ataxia: 0 8 Sensory: 1 9 Best Language:3  10 Dysarthria: 2 11 Extinct. and Inatten.:1  TOTAL: 13  Labs I have reviewed labs in epic and the results pertinent to this consultation are:  CBC    Component Value Date/Time   WBC 9.0 12/09/2019 0241   RBC 2.09 (L) 12/09/2019 0241   RBC 2.05 (L) 12/09/2019 0241   HGB 6.9 (LL) 12/09/2019 0241   HCT 22.5 (L) 12/09/2019 0241   PLT 230 12/09/2019 0241   MCV 107.7 (H) 12/09/2019 0241   MCH 33.0 12/09/2019 0241   MCHC 30.7 12/09/2019 0241   RDW 13.8 12/09/2019 0241   LYMPHSABS 1.5 04/04/2016 1505   MONOABS 0.6 04/04/2016 1505   EOSABS 0.2 04/04/2016 1505   BASOSABS 0.0 04/04/2016 1505   CMP     Component Value Date/Time   NA 140 12/09/2019 0241   K 3.7 12/09/2019 0241   CL 108 12/09/2019 0241   CO2 25 12/09/2019 0241   GLUCOSE 101 (H) 12/09/2019 0241   BUN 14 12/09/2019 0241    CREATININE 0.41 (L) 12/09/2019 0241   CALCIUM 8.1 (L) 12/09/2019 0241   PROT 5.0 (L) 12/09/2019 0241   ALBUMIN 2.8 (L) 12/09/2019 0241   AST 44 (H) 12/09/2019 0241   ALT 39 12/09/2019 0241   ALKPHOS 50 12/09/2019 0241   BILITOT 1.4 (H) 12/09/2019 0241   GFRNONAA >60 12/09/2019 0241   GFRAA >60 12/09/2019 0241   Imaging I have reviewed the images obtained:  CT-scan of the brain on arrival on 12/06/2029   MRI examination of the brain shows a acute/subacute left MCA territory infarct as well as a right frontal cortical infarct.  Multifocal small volume acute/early subacute subarachnoid hemorrhage and intraventricular hemorrhage most notably along the interhemispheric fissure, left frontal lobe and within left sylvian fissure is also noted.  Mild IVH in bilateral occipital horns.  Small volume subarachnoid and intraventricular hemorrhage present on the head CT of 12/07/2019 retrospectively.  Assessment: 86 year old woman with above past medical history brought into the hospital after having been found down due to a presumed fall.  She had extensive bruising of the right shoulder and chest area along with a right humeral fracture.  She was confused and the confusion persisted even after treating her rhabdomyolysis that she presented with, prompting an MRI of the brain to be performed.  The MRI of the brain revealed a subacute left MCA territory infarct as well as a right frontal cortical infarct. It is likely that she probably had the stroke leading to the fall and the IVH as well as interhemispheric and left sylvian fissure subarachnoid's are traumatic in nature.  Impression: -Acute ischemic stroke-likely cardioembolic given history of nonischemic cardiomyopathy and at some point ejection fraction of 20%. -Evaluate for Afib -Fall with traumatic IVH and traumatic subarachnoid hemorrhage. -History of falls and possible history of cognitive deficits. -Rhabdomyolysis came in with CK of 2943.  CK  today down to 674, renal function stable.  Recommendations: Continue telemetry -Continue neurochecks-can be moved to neuro floor -CTA head and neck to look for any vascular malformations or occlusion/stenosis. -A1c -Lipid panel -2D echocardiogram - may need TEE and loop recorder -PT -OT next speech therapy -No need to keep her n.p.o.-she is already been cleared by speech therapy for diet.  Dysphagia 1 diet reordered. -No need for permissive hypertension especially in the setting of traumatic subarachnoid  and IVH.  I would keep the blood pressure below 160. -No antiplatelets or anticoagulation due to IVH/subarachnoid hemorrhage as well as currently requiring blood transfusion (likely secondary to exacerbation of chronic anemia)-no active source of bleeding identified Discussed my plan with Dr. Maryland Pink on the floor.   Amie Portland, MD [facility] Pager: [phone] If 7pm to 7am, please call on call as listed on AMION.

## 2019-12-09 NOTE — Progress Notes (Signed)
SLP Cancellation Note  Patient Details Name: Jennifer Rocha MRN: [id number] DOB: 1927/10/13   Cancelled treatment:       Reason Eval/Treat Not Completed: Medical issues which prohibited therapy - Pt is currently NPO, undergoing stroke work up.   MRI results indicate: Fairly extensive acute/early subacute left MCA territory infarct... Additional small acute infarct within the paramedian right frontal lobe.Marland KitchenMarland KitchenMultifocal small volume acute/early subacute subarachnoid hemorrhage.Marland KitchenMarland Kitchenpresent on the prior head CT of 12/07/2019  Consulted with RN and MD - will recheck once MD indicates PO intake can resume.  Jennifer Rocha, [facility], [place] Speech Language Pathologist Office: [phone]  [name] 12/09/2019, 12:46 PM

## 2019-12-09 NOTE — Progress Notes (Signed)
OT Cancellation Note  Patient Details Name: Jennifer Rocha MRN: [id number] DOB: Aug 11, 1927   Cancelled Treatment:    Reason Eval/Treat Not Completed: Medical issues which prohibited therapy;Patient at procedure or test/ unavailable. Pt at MRI, awaiting blood transfusion for hgb of 6.9. Will follow.  Malka So 12/09/2019, 11:15 AM  Nestor Lewandowsky, OTR/L Acute Rehabilitation Services Pager: [phone] Office: [phone]

## 2019-12-09 NOTE — Plan of Care (Signed)
  Problem: Education: Goal: Knowledge of General Education information will improve Description: Including pain rating scale, medication(s)/side effects and non-pharmacologic comfort measures Outcome: Progressing   Problem: Health Behavior/Discharge Planning: Goal: Ability to manage health-related needs will improve 12/09/2019 1343 by Williams Che, RN Outcome: Progressing 12/09/2019 1341 by Williams Che, RN Outcome: Progressing   Problem: Clinical Measurements: Goal: Ability to maintain clinical measurements within normal limits will improve 12/09/2019 1343 by Williams Che, RN Outcome: Progressing 12/09/2019 1341 by Williams Che, RN Outcome: Progressing   Problem: Activity: Goal: Risk for activity intolerance will decrease 12/09/2019 1343 by Williams Che, RN Outcome: Progressing 12/09/2019 1341 by Williams Che, RN Outcome: Progressing   Problem: Nutrition: Goal: Adequate nutrition will be maintained 12/09/2019 1343 by Williams Che, RN Outcome: Progressing 12/09/2019 1341 by Williams Che, RN Outcome: Progressing   Problem: Coping: Goal: Level of anxiety will decrease 12/09/2019 1343 by Williams Che, RN Outcome: Progressing 12/09/2019 1341 by Williams Che, RN Outcome: Progressing   Problem: Elimination: Goal: Will not experience complications related to bowel motility 12/09/2019 1343 by Williams Che, RN Outcome: Progressing 12/09/2019 1341 by Williams Che, RN Outcome: Progressing   Problem: Pain Managment: Goal: General experience of comfort will improve 12/09/2019 1343 by Williams Che, RN Outcome: Progressing 12/09/2019 1341 by Williams Che, RN Outcome: Progressing   Problem: Safety: Goal: Ability to remain free from injury will improve 12/09/2019 1343 by Williams Che, RN Outcome: Progressing 12/09/2019 1341 by Williams Che, RN Outcome: Progressing   Problem: Skin  Integrity: Goal: Risk for impaired skin integrity will decrease 12/09/2019 1343 by Williams Che, RN Outcome: Progressing 12/09/2019 1341 by Williams Che, RN Outcome: Progressing

## 2019-12-09 NOTE — Progress Notes (Signed)
  Echocardiogram 2D Echocardiogram has been performed.  Jannett Celestine 12/09/2019, 2:48 PM

## 2019-12-10 DIAGNOSIS — I6340 Cerebral infarction due to embolism of unspecified cerebral artery: Secondary | ICD-10-CM | POA: Insufficient documentation

## 2019-12-10 DIAGNOSIS — T796XXD Traumatic ischemia of muscle, subsequent encounter: Secondary | ICD-10-CM

## 2019-12-10 DIAGNOSIS — S42211G Unspecified displaced fracture of surgical neck of right humerus, subsequent encounter for fracture with delayed healing: Secondary | ICD-10-CM

## 2019-12-10 DIAGNOSIS — D649 Anemia, unspecified: Secondary | ICD-10-CM

## 2019-12-10 LAB — CBC
HCT: 29.3 % — ABNORMAL LOW (ref 36.0–46.0)
Hemoglobin: 9.3 g/dL — ABNORMAL LOW (ref 12.0–15.0)
MCH: 32.1 pg (ref 26.0–34.0)
MCHC: 31.7 g/dL (ref 30.0–36.0)
MCV: 101 fL — ABNORMAL HIGH (ref 80.0–100.0)
Platelets: 269 10*3/uL (ref 150–400)
RBC: 2.9 MIL/uL — ABNORMAL LOW (ref 3.87–5.11)
RDW: 17.2 % — ABNORMAL HIGH (ref 11.5–15.5)
WBC: 9.2 10*3/uL (ref 4.0–10.5)
nRBC: 0.3 % — ABNORMAL HIGH (ref 0.0–0.2)

## 2019-12-10 LAB — COMPREHENSIVE METABOLIC PANEL
ALT: 35 U/L (ref 0–44)
AST: 36 U/L (ref 15–41)
Albumin: 3 g/dL — ABNORMAL LOW (ref 3.5–5.0)
Alkaline Phosphatase: 55 U/L (ref 38–126)
Anion gap: 7 (ref 5–15)
BUN: 8 mg/dL (ref 8–23)
CO2: 27 mmol/L (ref 22–32)
Calcium: 8.6 mg/dL — ABNORMAL LOW (ref 8.9–10.3)
Chloride: 104 mmol/L (ref 98–111)
Creatinine, Ser: 0.37 mg/dL — ABNORMAL LOW (ref 0.44–1.00)
GFR calc Af Amer: >60 mL/min (ref >60)
GFR calc non Af Amer: >60 mL/min (ref >60)
Glucose, Bld: 101 mg/dL — ABNORMAL HIGH (ref 70–99)
Potassium: 3.5 mmol/L (ref 3.5–5.1)
Sodium: 138 mmol/L (ref 135–145)
Total Bilirubin: 2.1 mg/dL — ABNORMAL HIGH (ref 0.3–1.2)
Total Protein: 5.5 g/dL — ABNORMAL LOW (ref 6.5–8.1)

## 2019-12-10 LAB — LIPID PANEL
Cholesterol: 132 mg/dL (ref 0–200)
HDL: 38 mg/dL — ABNORMAL LOW (ref >40)
LDL Cholesterol: 77 mg/dL (ref 0–99)
Total CHOL/HDL Ratio: 3.5 RATIO
Triglycerides: 86 mg/dL (ref <150)
VLDL: 17 mg/dL (ref 0–40)

## 2019-12-10 LAB — BPAM RBC
Blood Product Expiration Date: 202108012359
ISSUE DATE / TIME: 202107151130
Unit Type and Rh: 6200

## 2019-12-10 LAB — TYPE AND SCREEN
ABO/RH(D): A POS
Antibody Screen: NEGATIVE
Unit division: 0

## 2019-12-10 LAB — HEMOGLOBIN A1C
Hgb A1c MFr Bld: 5.1 % (ref 4.8–5.6)
Mean Plasma Glucose: 99.67 mg/dL

## 2019-12-10 MED ORDER — LORAZEPAM 1 MG PO TABS: 1.0000 mg | ORAL_TABLET | ORAL | Status: AC | PRN

## 2019-12-10 MED ORDER — ASPIRIN EC 81 MG PO TBEC
81.0000 mg | DELAYED_RELEASE_TABLET | Freq: Every day | ORAL | Status: DC
  Administered 2019-12-11 – 2019-12-15 (×5): 81 mg via ORAL
  Filled 2019-12-10 (×5): qty 1

## 2019-12-10 MED ORDER — LORAZEPAM 2 MG/ML IJ SOLN
0.0000 mg | Freq: Three times a day (TID) | INTRAMUSCULAR | Status: DC
  Administered 2019-12-12: 2 mg via INTRAVENOUS
  Administered 2019-12-13 (×2): 1 mg via INTRAVENOUS
  Administered 2019-12-14: 2 mg via INTRAVENOUS
  Filled 2019-12-10: qty 1
  Filled 2019-12-10: qty 2
  Filled 2019-12-10: qty 1

## 2019-12-10 MED ORDER — CYANOCOBALAMIN 1000 MCG/ML IJ SOLN
1000.0000 ug | Freq: Every day | INTRAMUSCULAR | Status: AC
  Administered 2019-12-10 – 2019-12-12 (×3): 1000 ug via SUBCUTANEOUS
  Filled 2019-12-10 (×3): qty 1

## 2019-12-10 MED ORDER — LORAZEPAM 2 MG/ML IJ SOLN
1.0000 mg | INTRAMUSCULAR | Status: AC | PRN
  Administered 2019-12-11 – 2019-12-12 (×2): 2 mg via INTRAVENOUS
  Filled 2019-12-10 (×4): qty 1

## 2019-12-10 MED ORDER — LABETALOL HCL 5 MG/ML IV SOLN
5.0000 mg | Freq: Once | INTRAVENOUS | Status: AC
  Administered 2019-12-10: 5 mg via INTRAVENOUS
  Filled 2019-12-10: qty 4

## 2019-12-10 MED ORDER — LORAZEPAM 2 MG/ML IJ SOLN
0.0000 mg | INTRAMUSCULAR | Status: DC
  Administered 2019-12-10 – 2019-12-12 (×6): 2 mg via INTRAVENOUS
  Filled 2019-12-10 (×5): qty 1

## 2019-12-10 NOTE — Progress Notes (Signed)
Patient had a lengthy and prolonged episode of extreme agitation from approximately 1340 to 1500.  Patient's son [name] called the nursing desk since the patient became agitated and had taken off her sling which was meant to protect her fractured right humerus. The patient was screaming loudly and persistently but was unable to vocalize needs.  The screaming was continuous and readily audible in the hallway.  Minimally invasive measures were taken to decrease the patient's agitation including distraction and mittens to protect the broken right humerus. Toileting needs of the patient were met to ensure that this was not the cause of agitation.   While attempting less invasive measures to calm the patient, the patient had taken her right fractured arm out of the sling five times over the course of 1340 to 1500 which was concerning for self-injury.  My primary concern as her nurse is to ensure patient safety and reduce her risk of self-injury. Agitation which results in her repeatedly removing a fractured right humerus from a sling poses a self-injury and safety risk for the patient.  When less invasive measures the calm the patient did not work, the patient's son [name] who was bedside requested that I notify the doctor and also requested that I administer medications to his mother to decrease her agitation and anxiety.  The patient was on CIWA protocol and had a CIWA score of 13 at 1340.  However, Ativan orders has expired.  I paged Dr. Thereasa Solo, MD to notify the provider of the sudden increase in agitation along with her repeated removal of her right arm sling.  In response, the provider placed orders for scheduled and PRN Ativan.  I reassessed my patient's CIWA score at 1452 which was a 12.  Based on my patient's behavior over the past hour, CIWA score at 1340 and CIWA score at 1452, my clinical judgement as a nurse was to administer 2 mg Ativan IV.  This was in the best interest of my patient's safety and  minimizing risk of self-injury.

## 2019-12-10 NOTE — TOC CAGE-AID Note (Signed)
Transition of Care [facility]) - CAGE-AID Screening   Patient Details  Name: Jennifer Rocha MRN: [id number] Date of Birth: Nov 07, 1927  Transition of Care [facility]) CM/SW Contact:    Emeterio Reeve, [place] Phone Number: 12/10/2019, 11:03 AM   Clinical Narrative:  Pt was unable to participate in assessment due to memory impairment.   CAGE-AID Screening: Substance Abuse Screening unable to be completed due to: : Patient unable to participate               [place] Clinical Social Worker [phone]

## 2019-12-10 NOTE — Progress Notes (Signed)
Physical Therapy Treatment Patient Details Name: Jennifer Rocha MRN: [id number] DOB: Oct 04, 1927 Today's Date: 12/10/2019    History of Present Illness Pt is a 86 y.o. F with significant PMH of CHF, HTN, who presents after a fall and extended time down with rhabdomyolysis, right humerus fracture and acute metabolic encephalopathy.    PT Comments    Pt received in bed, pleasant and agreeable to participation in therapy. She required mod assist supine to sit. RUE sling adjusted while pt sitting EOB. She required mod assist sit to stand and min/HHA of 1 ambulation 5'. Pt in recliner with feet elevated at end of session.    Follow Up Recommendations  SNF     Equipment Recommendations  Other (comment) (defer to next venue)    Recommendations for Other Services       Precautions / Restrictions Precautions Precautions: Fall Required Braces or Orthoses: Sling Restrictions RUE Weight Bearing: Non weight bearing    Mobility  Bed Mobility Overal bed mobility: Needs Assistance Bed Mobility: Sit to Supine       Sit to supine: Mod assist   General bed mobility comments: assist to elevate trunk. Use of bed pad to scoot to EOB. Cues for sequencing.  Transfers Overall transfer level: Needs assistance Equipment used: 1 person hand held assist Transfers: Sit to/from Stand Sit to Stand: Mod assist         General transfer comment: assist to power up and stabilize balance  Ambulation/Gait Ambulation/Gait assistance: Min assist Gait Distance (Feet): 5 Feet Assistive device: 1 person hand held assist Gait Pattern/deviations: Step-through pattern;Decreased stride length;Trunk flexed;Shuffle Gait velocity: decreased Gait velocity interpretation: <1.31 ft/sec, indicative of household ambulator General Gait Details: short, shuffle steps. Assist to maintain balance. R sling in place for mobility.   Stairs             Wheelchair Mobility    Modified Rankin (Stroke Patients  Only)       Balance Overall balance assessment: Needs assistance Sitting-balance support: Feet supported;No upper extremity supported Sitting balance-Leahy Scale: Fair     Standing balance support: Single extremity supported;During functional activity Standing balance-Leahy Scale: Poor Standing balance comment: reliant on external support                            Cognition Arousal/Alertness: Awake/alert Behavior During Therapy: WFL for tasks assessed/performed Overall Cognitive Status: Impaired/Different from baseline Area of Impairment: Orientation;Following commands;Awareness;Attention;Memory                 Orientation Level: Disoriented to;Place;Time;Situation Current Attention Level: Sustained Memory: Decreased short-term memory Following Commands: Follows one step commands inconsistently;Follows one step commands with increased time   Awareness: Intellectual   General Comments: Smiling at times. Mumbling mostly unintelligible speech. Cooperative.      Exercises      General Comments General comments (skin integrity, edema, etc.): Son arrived at end of session.      Pertinent Vitals/Pain Pain Assessment: Faces Faces Pain Scale: Hurts a little bit Pain Location: R shoulder  Pain Descriptors / Indicators: Grimacing;Guarding Pain Intervention(s): Monitored during session;Repositioned    Home Living                      Prior Function            PT Goals (current goals can now be found in the care plan section) Acute Rehab PT Goals Patient Stated Goal: not stated Progress  towards PT goals: Progressing toward goals    Frequency    Min 3X/week      PT Plan Current plan remains appropriate    Co-evaluation              AM-PAC PT "6 Clicks" Mobility   Outcome Measure  Help needed turning from your back to your side while in a flat bed without using bedrails?: A Lot Help needed moving from lying on your back to  sitting on the side of a flat bed without using bedrails?: A Lot Help needed moving to and from a bed to a chair (including a wheelchair)?: A Little Help needed standing up from a chair using your arms (e.g., wheelchair or bedside chair)?: A Lot Help needed to walk in hospital room?: A Little Help needed climbing 3-5 steps with a railing? : A Lot 6 Click Score: 14    End of Session Equipment Utilized During Treatment: Gait belt;Other (comment) (RUE sling) Activity Tolerance: Patient tolerated treatment well Patient left: in chair;with call bell/phone within reach;with chair alarm set;with family/visitor present Nurse Communication: Mobility status PT Visit Diagnosis: Pain;Difficulty in walking, not elsewhere classified (R26.2);Unsteadiness on feet (R26.81) Pain - Right/Left: Right Pain - part of body: Shoulder     Time: 3570-1779 PT Time Calculation (min) (ACUTE ONLY): 24 min  Charges:  $Gait Training: 8-22 mins $Therapeutic Activity: 8-22 mins                     Lorrin Goodell, PT  Office # [phone] Pager [phone]    [name] 12/10/2019, 9:28 AM

## 2019-12-10 NOTE — Progress Notes (Addendum)
DAYSIE HELF  NKN:[id number] DOB: [birth year] DOA: 12/07/2019 PCP: Reynold Bowen, MD    Brief Narrative:  86 year old with a history of chronic combined CHF, HTN, and HLD who presented to the ED after suffering a fall and being found down after prolonged period time.  The exact events leading to her fall were not clear.  The patient called her daughter reporting that she was unable to get up and was found to be confused.  On further history family did report progressively worsening confusion over a month or more.  Full evaluation in the ED reveals a humeral neck fracture as well as mild rhabdomyolysis.  Orthopedics evaluated the patient and recommended the splint with outpatient follow-up.  Antimicrobials:  None  Subjective: Has been intermittently severely agitated today consistent with alcohol withdrawal necessitating resumption of Ativan protocol.  We will look at examiner but does not attempt to speak and does not follow simple commands.  Assessment & Plan:  Acute ischemic stroke Due to persistent confusion and speech difficulty MRI of the brain was accomplished 7/15 and revealed an extensive left MCA CVA as well as a small CVA in the right frontal lobe and even concern for possible subarachnoid hemorrhage - felt to likely be cardioembolic -work-up underway per Stroke Team  Subarachnoid hemorrhage and intraventricular hemorrhage Not originally commented on at time of CT head 7/13 but noted on MRI 7/15 and retrospectively felt to have been present at time of 7/13 CT  Altered mental status /acute metabolic encephalopathy Family reported patient has had memory issues for last few months -CT head at time of admission noted only atrophy and chronic microvascular disease   Traumatic rhabdomyolysis Due to fall and prolonged downtime -CK peaked at 2943 -has consistently trended downward with IV resuscitation  Right humerus fracture Evaluated by trauma Ortho at time of presentation -plan  is for sling and outpatient monitoring -hopeful to avoid surgery  Traumatic fall Patient underwent multiple radiological studies including pelvic x-ray, x-ray of the left foot, right shoulder, CT scan of the head, CT scan of the cervical spine, CT scan of the maxillofacial area, CT scan of the chest abdomen and pelvis.  Apart from the injury to her humerus no other injuries were noted.  Macrocytic anemia -anemia of acute blood loss Significant blood loss felt to be related to bleeding into forearm and chest related to her humeral fracture - status post 1 unit PRBC 7/15 with good response  Essential hypertension With goal to keep systolic below 379 in setting of subarachnoid/IVH  Chronic combined systolic and diastolic CHF No old TTE in our system -stress test records from 2017 suggest EF 45-55% -clinically compensated -TTE this admit notes EF 55-60% and indeterminate diastolic parameters  HLD On chronic Lipitor  History of alcohol dependence Continue thiamine support and Ativan withdrawal protocol  Goals of care Given advanced age and history suggestive of progressive dementia Palliative Care has been consulted to consider goals of care   DVT prophylaxis: SCDs Code Status: DNR Family Communication:  Status is: Inpatient  Remains inpatient appropriate because:Inpatient level of care appropriate due to severity of illness   Dispo:  Patient From: Home  Planned Disposition: To be determined  Expected discharge date: 12/13/19  Medically stable for discharge: No     Consultants:  Neurology Trauma Ortho Palliative Care  Objective: Blood pressure (!) 166/69, pulse 91, temperature 98.8 F (37.1 C), temperature source Oral, resp. rate 20, height 5\' 3"  (1.6 m), weight 65 kg, SpO2 95 %.  Intake/Output Summary (Last 24 hours) at 12/10/2019 0942 Last data filed at 12/10/2019 0615 Gross per 24 hour  Intake --  Output 700 ml  Net -700 ml   Filed Weights   12/07/19 0901   Weight: 65 kg    Examination: General: No acute respiratory distress Lungs: Clear to auscultation bilaterally without wheezes or crackles Cardiovascular: Regular rate and rhythm without murmur gallop or rub normal S1 and S2 Abdomen: Nontender, nondistended, soft, bowel sounds positive, no rebound, no ascites, no appreciable mass Extremities: No significant cyanosis, clubbing, or edema bilateral lower extremities  CBC: Recent Labs  Lab 12/08/19 0512 12/08/19 0512 12/09/19 0241 12/09/19 1719 12/10/19 0409  WBC 9.3  --  9.0  --  9.2  HGB 7.4*   < > 6.9* 8.6* 9.3*  HCT 23.0*   < > 22.5* 27.3* 29.3*  MCV 106.5*  --  107.7*  --  101.0*  PLT 200  --  230  --  269   < > = values in this interval not displayed.   Basic Metabolic Panel: Recent Labs  Lab 12/08/19 0512 12/09/19 0241 12/10/19 0409  NA 140 140 138  K 3.0* 3.7 3.5  CL 105 108 104  CO2 27 25 27   GLUCOSE 104* 101* 101*  BUN 20 14 8   CREATININE 0.49 0.41* 0.37*  CALCIUM 8.3* 8.1* 8.6*  MG 2.2  --   --    GFR: Estimated Creatinine Clearance: 42.4 mL/min (A) (by C-G formula based on SCr of 0.37 mg/dL (L)).  Liver Function Tests: Recent Labs  Lab 12/07/19 0841 12/09/19 0241 12/10/19 0409  AST 92* 44* 36  ALT 55* 39 35  ALKPHOS 63 50 55  BILITOT 2.2* 1.4* 2.1*  PROT 6.5 5.0* 5.5*  ALBUMIN 3.7 2.8* 3.0*    Coagulation Profile: Recent Labs  Lab 12/07/19 0841  INR 1.0    Cardiac Enzymes: Recent Labs  Lab 12/07/19 0841 12/08/19 0512 12/09/19 0241  CKTOTAL 2,943* 1,102* 674*    HbA1C: Hgb A1c MFr Bld  Date/Time Value Ref Range Status  12/10/2019 04:09 AM 5.1 4.8 - 5.6 % Final    Comment:    (NOTE) Pre diabetes:          5.7%-6.4%  Diabetes:              >6.4%  Glycemic control for   <7.0% adults with diabetes      Recent Results (from the past 240 hour(s))  SARS Coronavirus 2 by RT PCR (hospital order, performed in [place] hospital lab) Nasopharyngeal Nasopharyngeal Swab      Status: None   Collection Time: 12/07/19 11:39 AM   Specimen: Nasopharyngeal Swab  Result Value Ref Range Status   SARS Coronavirus 2 NEGATIVE NEGATIVE Final    Comment: (NOTE) SARS-CoV-2 target nucleic acids are NOT DETECTED.  The SARS-CoV-2 RNA is generally detectable in upper and lower respiratory specimens during the acute phase of infection. The lowest concentration of SARS-CoV-2 viral copies this assay can detect is 250 copies / mL. A negative result does not preclude SARS-CoV-2 infection and should not be used as the sole basis for treatment or other patient management decisions.  A negative result may occur with improper specimen collection / handling, submission of specimen other than nasopharyngeal swab, presence of viral mutation(s) within the areas targeted by this assay, and inadequate number of viral copies (<250 copies / mL). A negative result must be combined with clinical observations, patient history, and epidemiological information.  Fact Sheet  for Patients:   StrictlyIdeas.no  Fact Sheet for Healthcare Providers: BankingDealers.co.za  This test is not yet approved or  cleared by the [place] FDA and has been authorized for detection and/or diagnosis of SARS-CoV-2 by FDA under an Emergency Use Authorization (EUA).  This EUA will remain in effect (meaning this test can be used) for the duration of the COVID-19 declaration under Section 564(b)(1) of the Act, 21 U.S.C. section 360bbb-3(b)(1), unless the authorization is terminated or revoked sooner.  Performed at [facility], [place] [address] [place], [place] [address]      Scheduled Meds: . sodium chloride   Intravenous Once  . acetaminophen  650 mg Oral Q6H  . atorvastatin  40 mg Oral Daily  . docusate sodium  100 mg Oral BID  . DULoxetine  30 mg Oral Daily  . folic acid  1 mg Oral Daily  . mouth rinse  15 mL Mouth Rinse BID  . multivitamin with  minerals  1 tablet Oral Daily  . thiamine  100 mg Oral Daily   Or  . thiamine  100 mg Intravenous Daily   Continuous Infusions: . methocarbamol (ROBAXIN) IV       LOS: 3 days   Cherene Altes, MD [facility] Office  [phone] Pager - Text Page per Amion  If 7PM-7AM, please contact night-coverage per Amion 12/10/2019, 9:42 AM

## 2019-12-10 NOTE — Evaluation (Signed)
Occupational Therapy Evaluation Patient Details Name: Jennifer Rocha MRN: [id number] DOB: Dec 22, 1927 Today's Date: 12/10/2019    History of Present Illness Pt is a 86 y.o. F with significant PMH of CHF, HTN, who presents after a fall and extended time down with rhabdomyolysis, right humerus fracture and acute metabolic encephalopathy.   Clinical Impression   PT admitted with R humerus fx. Pt currently with functional limitiations due to the deficits listed below (see OT problem list). Pt currently demonstrates expressive aphasia and gestures to get staffs attention. Pt following simple 1 step commands throughout session. Pt can benefit from close monitoring of skin on R UE due to edema present and remove sling once a shift to check elbow.  Pt will benefit from skilled OT to increase their independence and safety with adls and balance to allow discharge SNF.     Follow Up Recommendations  SNF    Equipment Recommendations  Other (comment) (defer )    Recommendations for Other Services Speech consult     Precautions / Restrictions Precautions Precautions: Fall Required Braces or Orthoses: Sling Restrictions Weight Bearing Restrictions: Yes RUE Weight Bearing: Non weight bearing      Mobility Bed Mobility Overal bed mobility: Needs Assistance Bed Mobility: Rolling Rolling: Mod assist         General bed mobility comments: pt log rolled onto L side and relaxed even sighed several times. pt smiled at therapist as care was being provided. pt able to bridge for new linen under buttock.   Transfers                 General transfer comment: defer to next session due to pt appears comfortable and Rn reporting restless prior to session    Balance                                           ADL either performed or assessed with clinical judgement   ADL Overall ADL's : Needs assistance/impaired             Lower Body Bathing: Total  assistance Lower Body Bathing Details (indicate cue type and reason): noted to have irration of the skin in the thighs from mesh panties. OT requesting RN not place panties on patient this session during hygiene due to risk. pt is supine in a  position that is keeping purewick in place at this time.                        General ADL Comments: pt adjusted to a elevated HOB position in the bed. pt interacting with children on both sides of the bed at the end of session. pt looking at old photos pointing and smiling.      Vision         Perception     Praxis      Pertinent Vitals/Pain Pain Assessment: No/denies pain Faces Pain Scale: Hurts a little bit Pain Location: R shoulder  Pain Descriptors / Indicators: Grimacing;Guarding Pain Intervention(s): Limited activity within patient's tolerance;Monitored during session;Repositioned     Hand Dominance Right   Extremity/Trunk Assessment Upper Extremity Assessment Upper Extremity Assessment: RUE deficits/detail RUE Deficits / Details: active movement of all digits, No activation at elbow. PROM of elbow and noted to have increased edema. ICe applied to shoulder. Bruising throughout UE.  RUE Coordination: decreased fine motor;decreased  gross motor   Lower Extremity Assessment Lower Extremity Assessment: Defer to PT evaluation   Cervical / Trunk Assessment Cervical / Trunk Assessment: Kyphotic (noted to have break down at midline of back)   Communication Communication Communication: Expressive difficulties   Cognition Arousal/Alertness: Awake/alert Behavior During Therapy: Restless Overall Cognitive Status: Impaired/Different from baseline Area of Impairment: Following commands                   Current Attention Level: Sustained Memory: Decreased recall of precautions Following Commands: Follows one step commands with increased time;Follows one step commands consistently       General Comments: pt shaking  mitten at therapist and states "off" clear enough to be understood. OT repeating the statement back to patient and touching hand. Pt made eye to eye contact in response. Pt appears calm and no restless behavior after needs met this session   General Comments  son and daughter present    Exercises     Shoulder Instructions      Home Living Family/patient expects to be discharged to:: Skilled nursing facility                                        Prior Functioning/Environment Level of Independence: Independent        Comments: Independent ADL's/IADL's, drives        OT Problem List: Decreased strength;Decreased activity tolerance;Impaired balance (sitting and/or standing);Decreased cognition;Decreased safety awareness;Decreased knowledge of use of DME or AE;Decreased knowledge of precautions;Obesity;Impaired UE functional use;Pain;Increased edema;Decreased range of motion;Decreased coordination      OT Treatment/Interventions: Self-care/ADL training;Therapeutic exercise;Neuromuscular education;DME and/or AE instruction;Manual therapy;Modalities;Splinting;Therapeutic activities;Cognitive remediation/compensation;Patient/family education;Balance training    OT Goals(Current goals can be found in the care plan section) Acute Rehab OT Goals Patient Stated Goal: visually attempting to pull mitten off and shaking it at therapist on arrival OT Goal Formulation: With patient/family Time For Goal Achievement: 12/24/19 Potential to Achieve Goals: Good  OT Frequency: Min 2X/week   Barriers to D/C: Decreased caregiver support  lives alone       Co-evaluation              AM-PAC OT "6 Clicks" Daily Activity     Outcome Measure Help from another person eating meals?: A Lot Help from another person taking care of personal grooming?: A Lot Help from another person toileting, which includes using toliet, bedpan, or urinal?: A Lot Help from another person bathing  (including washing, rinsing, drying)?: A Lot Help from another person to put on and taking off regular upper body clothing?: A Lot Help from another person to put on and taking off regular lower body clothing?: A Lot 6 Click Score: 12   End of Session Nurse Communication: Mobility status;Precautions  Activity Tolerance: Patient tolerated treatment well Patient left: in bed;with call bell/phone within reach;with bed alarm set;with family/visitor present;with nursing/sitter in room;with SCD's reapplied  OT Visit Diagnosis: Unsteadiness on feet (R26.81);Muscle weakness (generalized) (M62.81)                Time: 4270-6237 OT Time Calculation (min): 27 min Charges:  OT General Charges $OT Visit: 1 Visit OT Evaluation $OT Eval Moderate Complexity: 1 Mod   Jennifer Rocha, OTR/L  Acute Rehabilitation Services Pager: [phone] Office: [phone] .   Jeri Modena 12/10/2019, 3:44 PM

## 2019-12-10 NOTE — Progress Notes (Signed)
  Speech Language Pathology Treatment: Dysphagia  Patient Details Name: Jennifer Rocha MRN: [id number] DOB: [birth year] Today's Date: 12/10/2019 Time: 3086-5784 SLP Time Calculation (min) (ACUTE ONLY): 20 min  Assessment / Plan / Recommendation Clinical Impression  Pt seen for skilled intervention targeting goals for assessment of diet tolerance. Pt's 2 sons were in attendance. Pt continued to have poor po intake, but appears to tolerate current (puree) diet and thin liquids. Delayed cough noted toward the end of po trials - suspect esophageal issues. Recommend RD consult for suggestions for supplements. Son asked about completion of MBS to objectively assess swallow function and safety. Pt's daughter has declined eval recently, given pt's busy testing/procedure schedule over the past couple of days. Recommend MBS to be completed prior to DC from acute care. Son verbalized agreement.  Recommend orders for speech/language evaluation to maximize effective communication and minimize caregiver burden. SLP will continue to follow acutely. Continued ST intervention is recommended at next venue.   HPI HPI: Pt is a 86 yo female presenting s/p fall with prolonged downtime. Admitted with rhabdomyolysis and humeral head fx. Per chart, family reported other recent falls and concern for increased confusion and memory loss. Family also reports a h/o coughing with solids PTA, although never evaluated. CXR and CT Chest both show clear lungs. PMH includes: CHF, HTN, HLD, ETOH dependence      SLP Plan  Continue with current plan of care;MBS       Recommendations  Diet recommendations: Dysphagia 1 (puree);Thin liquid Liquids provided via: Cup;Straw Medication Administration: Crushed with puree Supervision: Staff to assist with self feeding;Full supervision/cueing for compensatory strategies Compensations: Minimize environmental distractions;Slow rate;Small sips/bites;Follow solids with liquid Postural Changes  and/or Swallow Maneuvers: Seated upright 90 degrees;Upright 30-60 min after meal                Oral Care Recommendations: Oral care QID Follow up Recommendations: Skilled Nursing facility;24 hour supervision/assistance SLP Visit Diagnosis: Dysphagia, unspecified (R13.10) Plan: Continue with current plan of care;MBS       GO               Jceon Alverio B. Quentin Ore, [facility], [place] Speech Language Pathologist Office: [phone] Pager: [phone]  [name] 12/10/2019, 12:22 PM

## 2019-12-10 NOTE — Progress Notes (Addendum)
STROKE TEAM PROGRESS NOTE   INTERVAL HISTORY Recommended stroke imaging and work up has been completed including CTA Head and Neck, Echocardiogram, Hemoglobin A1C and Lipid Panel. Per the patients son, who is at bedside, she is more lethargic today and she continues to have difficulty with her speech. He is concerned as the patient was talking when she was first admitted to the hospital. A discussion was held with him regarding the patients stroke and how it has caused aphasia.   Vitals:   12/09/19 2346 12/10/19 0348 12/10/19 0432 12/10/19 0858  BP: (!) 143/75 (!) 170/124 (!) 160/80 (!) 166/69  Pulse: 89 (!) 108 88 91  Resp: 18 20  20   Temp: 97.7 F (36.5 C) 98.5 F (36.9 C)  98.8 F (37.1 C)  TempSrc: Oral Oral  Oral  SpO2: (!) 87% 100%  95%  Weight:      Height:       Pertinent Imaging and Lab Work  12/07/19 CT Head WO Contrast  Atrophy, chronic microvascular disease. No acute intracranial abnormality. 12/09/19 Echocardiogram Complete  LVEF by estimation is 55 to 60 %. Left atrium is normal in size. Right atrial is normal in size. There is mild left ventricular hypertrophy. Left ventricular diastolic parameters are indeterminate.  12/09/19 CT Angio Head and Neck  1. Negative for large vessel occlusion. No Left MCA branch occlusion identified. Post ischemic/luxury perfusion enhancement suspected in the Left MCA territory. 2. Mild for age atherosclerosis in the head and neck. However, there is mild to moderate Right ACA A1 segment stenosis. 12/09/19 MRI Brain WO Contrast There is fairly extensive and predominantly cortical restricted diffusion within the left MCA vascular territory, within the anterolateral left frontal lobe, left frontal operculum, left parietal lobe, left temporal lobe and left insula as well as subinsular region consistent with acute/early subacute infarction.  Stroke labs: 12/10/19 Hemoglobin A1C: 5.1  12/10/19 Lipid panel: Total cholesterol 132, LDL 77    PHYSICAL EXAM Constitutional: Resting in bed upon entering the room MS: Lethargic, rouses to voice, does not follow commands Speech: Densely aphasic, attempts to talk and speech is severely garbled/incomprehensible  Cranial nerves: EOMI, VFF with blink to threat  Strength: Unable to do formal strength testing due to aphasia. She is antigravity in LUE, BLE. She lifts all of these extremities with prompting. Did not assess her right arm as it is in a sling due to humoral fracture Coordination: UTA  Gait: Deferred   ASSESSMENT/PLAN Ms. SHAIANN MCMANAMON is a 86 y.o. female w/pmh of alcohol abuse, HTN, HLD, nonischemic cardiomyopathy w/Left HF who presents after a fall on 12/07/19. Due to continued confusion during admission an MRI of the Brain was ordered that revealed a L MCA Stroke and R Frontal Lobe stroke. Stroke work up was initiated including CT Angio Head and Neck that did not reveal an LVO, did show moderate atherosclerosis in the head and neck and mild right ACA A1 segment stenosis. She was outside of the time window for IVTPA and ineligible for mechanical thrombectomy due to lack of LVO. Her Echocardiogram was negative for thrombus, did not show an enlarged left atrium, EF 55 to 60 %. Her stroke risk factors include HLD and HTN. Given that her stroke is cortical,in two different vascular territories and CTA Head and Neck did not reveal diffuse atherosclerosis it is likely cardio embolic in origin. At this time, her stroke work up is complete.   L MCA Stroke + R Superior Frontal Lobe Stroke likely cardioembolic in  origin - Recommend initiating Aspirin 81 mg for secondary stroke prevention when hemoglobin has stabilized.  - Recommend to initiate Lovenox SQ for DVT Prophylaxis when hemoglobin is stable   - Continue Atorvastatin 40 mg for secondary stroke prevention  - Continue PT/OT/ST  - Will defer on anticoagulation at this time given SAH/IVH. When she follows up with the neurology clinic in 4  weeks a loop recorder can be considered if she is a good candidate for possible anticoagulation.   Hypertension - Management per primary team  - Her blood pressure has been trending in the 140s-170s - SBP goal < 180, gradually normalize in 5-7 days  - Long term blood pressure goal < 130/90   Hyperlipidemia - LDL 77, LDL goal <70 - Continue Atorvastatin 40 mg   Neurology will sign off at this time. Please contact via Amion with any questions.  Ruta Hinds, NP  Stroke Nurse Practitioner  Patient discussed with attending physician Dr. Erlinda Hong   ATTENDING NOTE: I reviewed above note and agree with the assessment and plan. Pt was seen and examined.   Patient seen and examined.  Patient son at bedside.  86 year old female with history of alcohol use, hypertension, hyperlipidemia, CHF admitted for falling at home, confusion with right humeral fracture and rhabdomyolysis.  MRI showed left MCA infarct and right ACA small infarct, as well as left hemisphere SAH and small left lateral ventricle IVH.  She does have trauma with right humeral fracture, and extensive bruises and ecchymosis around right side neck and trunk.  Her left facial ecchymosis appears to be subacute due to recent fall as per son.  CT head and neck unremarkable.  EF 55 to 60%.  A1c 5.1 LDL 77.  She had significant hemoglobin drop from 10.2 on admission to 6.9 yesterday, and received 2 units of PRBC, this morning 9.3.  On exam, patient awake alert, however global aphasia, nonverbal and not following commands.  No gaze palsy, PERRL.  Blinking to visual threat bilaterally.  No significant facial droop.  Tongue protrusion not corporative.  Right upper extremity fracture with sling but not moving.  Left upper extremity spontaneous movement.  Bilateral lower extremity at least 3/5, symmetrical. Sensation, coordination and gait not tested.  Patient stroke concerning for cardioembolic source.  The [facility] and IVH likely due to hemorrhagic  conversion instead of traumatic.  However, patient currently not anticoagulation candidate given IVH, SAH, high fall risk at home and severe anemia needing blood transfusion.  Would not recommend cardioembolic work-up at this time.  However, will follow up patient in clinic in about 4 weeks with Dr. Leonie Man.  If patient becomes anticoagulation candidate, embolic work-up can be considered at that time.  Currently, hemoglobin stable and hemorrhagic conversion has been several days out, okay to start aspirin 81 from tomorrow.  Continue statin.  Patient on CIWA protocol.  PT/OT recommend SNF.  Discussed with Dr. Thereasa Solo.  Neurology will sign off. Please call with questions. Pt will follow up with stroke clinic Dr. Leonie [facility] at [facility] in about 4 weeks. Thanks for the consult.   Rosalin Hawking, MD PhD Stroke Neurology 12/10/2019 7:09 PM

## 2019-12-10 NOTE — Progress Notes (Signed)
   Palliative Medicine Inpatient Follow Up Note   HPI: 86 y.o. female   admitted on 12/07/2019 with   medical history significant ofchronic combined CHF; HTN; and HLD presenting with a fall at home with prolonged downtime.  Admitted with rhabdomyolysis, and humeral head fracture on the right.   Today's Discussion (12/10/2019): Chart reviewed. I met with Jennifer Rocha (daughter) at bedside. She expressed that there had not been much change in her mothers status. She shared that the plan for her mother is to see over the next week if she makes any gains. She is quite somnolent during my time at bedside. Her daughter states that she had not slept overnight.  Jennifer Rocha expresses that her mother was an incredibly independent woman. She feels that she would never wish for a life whereby her independence is not present. I inquired further. She said that her mother was still driving until this hospitalization. She is reasonably tearful during our time together.  I shared with Jennifer Rocha that I understand her hope is for improvement though it appears that her mother is not eating or drinking sufficiently. We talked about the long term effects of no nutrition. She states that her mother never wanted a feeding tube. I shared that sometimes despite the best efforts of medical care our bodies are only capable of sustaining so much. She understood this.  I completed a MOST form today. The patient and family outlined their wishes for the following treatment decisions:  Cardiopulmonary Resuscitation: Do Not Attempt Resuscitation (DNR/No CPR)  Medical Interventions: Limited Additional Interventions: Use medical treatment, IV fluids and cardiac monitoring as indicated, DO NOT USE intubation or mechanical ventilation. May consider use of less invasive airway support such as BiPAP or CPAP. Also provide comfort measures. Transfer to the hospital if indicated. Avoid intensive care.  NO HD  Antibiotics: Determine use of limitation of  antibiotics when infection occurs  IV Fluids: IV fluids for a defined trial period  Feeding Tube: No feeding tube   Discussed the importance of continued conversation with family and their  medical providers regarding overall plan of care and treatment options, ensuring decisions are within the context of the patients values and GOCs.  Provided "Hard Choices for [employer]" booklet.   Questions and concerns addressed   SUMMARY OF RECOMMENDATIONS   DNAR/DNI  MOST Completed, paper copy placed onto the chart electric copy can be found in the media section of epic  DNR Form Completed, paper copy placed onto the chart electric copy can be found in the media section of epic  Patients family would like to see if she improves at all with present interventions. Provided education on her FTT and how this may evolve into a terminal situation.   PMT will remain involved to offer ongoing support   Time Spent: 45 Greater than 50% of the time was spent in counseling and coordination of care ______________________________________________________________________________________ [place] Team Team Cell Phone: [phone] Please utilize secure chat with additional questions, if there is no response within 30 minutes please call the above phone number  Palliative Medicine Team providers are available by phone from 7am to 7pm daily and can be reached through the team cell phone.  Should this patient require assistance outside of these hours, please call the patient's attending physician.

## 2019-12-10 NOTE — TOC Initial Note (Signed)
Transition of Care [facility]) - Initial/Assessment Note    Patient Details  Name: GIDGET QUIZHPI MRN: [id number] Date of Birth: 1928/04/06  Transition of Care [facility]) CM/SW Contact:    Geralynn Ochs, LCSW Phone Number: 12/10/2019, 11:04 AM  Clinical Narrative:     CSW met with patient's son, [name], at bedside to initiate discussion on discharge planning. CSW presented recommendation for SNF at discharge, but [name] said he wasn't sure if the patient would be able to do that or not, as she is very lethargic today, not speaking, and not eating much. Palliative consult pending for goals of care at this time. [name] concerned about the patient's current condition, unsure what she will need at discharge. [name] indicated that if the patient were to improve, then they would be interested in SNF at [facility]. CSW to follow and will send referral to [place].       Expected Discharge Plan: Skilled Nursing Facility Barriers to Discharge: Continued Medical Work up   Patient Goals and CMS Choice Patient states their goals for this hospitalization and ongoing recovery are:: patient unable to participate in goal setting due to disorientation CMS Medicare.gov Compare Post Acute Care list provided to:: Patient Represenative (must comment) Choice offered to / list presented to : Adult Children  Expected Discharge Plan and Services Expected Discharge Plan: [place] Acute Care Choice: NA Living arrangements for the past 2 months: Single Family Home                                      Prior Living Arrangements/Services Living arrangements for the past 2 months: Single Family Home Lives with:: Self Patient language and need for interpreter reviewed:: No Do you feel safe going back to the place where you live?: Yes      Need for Family Participation in Patient Care: Yes (Comment) Care giver support system in place?: No (comment)   Criminal Activity/Legal Involvement  Pertinent to Current Situation/Hospitalization: No - Comment as needed  Activities of Daily Living      Permission Sought/Granted Permission sought to share information with : Facility Sport and exercise psychologist, Family Supports Permission granted to share information with : Yes, Verbal Permission Granted  Share Information with NAME: Milas Gain, [name]  Permission granted to share info w AGENCY: SNF ([facility])  Permission granted to share info w Relationship: Children     Emotional Assessment Appearance:: Appears stated age Attitude/Demeanor/Rapport: Unable to Assess Affect (typically observed): Unable to Assess Orientation: : Oriented to Self Alcohol / Substance Use: Alcohol Use Psych Involvement: No (comment)  Admission diagnosis:  Fall [W19.XXXA] Traumatic rhabdomyolysis, initial encounter ([place]) [T79.6XXA] Closed fracture of neck of right humerus, initial encounter [S42.211A] Anemia, unspecified type [D64.9] Patient Active Problem List   Diagnosis Date Noted  . Closed fracture of neck of right humerus   . Acute pain of right shoulder   . DNR (do not resuscitate)   . Palliative care by specialist   . Traumatic rhabdomyolysis ([place]) 12/07/2019  . Fracture of humeral head, closed, right, initial encounter 12/07/2019  . Fall at home, initial encounter 12/07/2019  . Alcohol dependence syndrome ([place]) 12/07/2019  . Benign essential tremor 01/27/2015  . Chronic combined systolic (congestive) and diastolic (congestive) heart failure ([place]) 08/23/2013  . Ethanol causing toxic effect ([place])   . Non-ischemic cardiomyopathy ([place])   . Hypertension    PCP:  [place],  Annie Main, MD Pharmacy:   [facility] [id number] [address] AT [facility] [address] [place] [address] Phone: [phone] Fax: [phone]     Social Determinants of Health ([place]) Interventions    Readmission Risk Interventions No flowsheet data found.

## 2019-12-11 DIAGNOSIS — Z7189 Other specified counseling: Secondary | ICD-10-CM

## 2019-12-11 LAB — COMPREHENSIVE METABOLIC PANEL
ALT: 30 U/L (ref 0–44)
AST: 28 U/L (ref 15–41)
Albumin: 2.9 g/dL — ABNORMAL LOW (ref 3.5–5.0)
Alkaline Phosphatase: 61 U/L (ref 38–126)
Anion gap: 10 (ref 5–15)
BUN: 5 mg/dL — ABNORMAL LOW (ref 8–23)
CO2: 28 mmol/L (ref 22–32)
Calcium: 8.5 mg/dL — ABNORMAL LOW (ref 8.9–10.3)
Chloride: 99 mmol/L (ref 98–111)
Creatinine, Ser: 0.38 mg/dL — ABNORMAL LOW (ref 0.44–1.00)
GFR calc Af Amer: >60 mL/min (ref >60)
GFR calc non Af Amer: >60 mL/min (ref >60)
Glucose, Bld: 95 mg/dL (ref 70–99)
Potassium: 3.3 mmol/L — ABNORMAL LOW (ref 3.5–5.1)
Sodium: 137 mmol/L (ref 135–145)
Total Bilirubin: 2.7 mg/dL — ABNORMAL HIGH (ref 0.3–1.2)
Total Protein: 5.4 g/dL — ABNORMAL LOW (ref 6.5–8.1)

## 2019-12-11 LAB — CBC
HCT: 30.9 % — ABNORMAL LOW (ref 36.0–46.0)
Hemoglobin: 10 g/dL — ABNORMAL LOW (ref 12.0–15.0)
MCH: 32.5 pg (ref 26.0–34.0)
MCHC: 32.4 g/dL (ref 30.0–36.0)
MCV: 100.3 fL — ABNORMAL HIGH (ref 80.0–100.0)
Platelets: 302 10*3/uL (ref 150–400)
RBC: 3.08 MIL/uL — ABNORMAL LOW (ref 3.87–5.11)
RDW: 16.5 % — ABNORMAL HIGH (ref 11.5–15.5)
WBC: 8.2 10*3/uL (ref 4.0–10.5)
nRBC: 0 % (ref 0.0–0.2)

## 2019-12-11 MED ORDER — CLONIDINE HCL 0.1 MG/24HR TD PTWK
0.1000 mg | MEDICATED_PATCH | TRANSDERMAL | Status: DC
  Administered 2019-12-11: 0.1 mg via TRANSDERMAL
  Filled 2019-12-11: qty 1

## 2019-12-11 MED ORDER — CARVEDILOL 6.25 MG PO TABS
6.2500 mg | ORAL_TABLET | Freq: Two times a day (BID) | ORAL | Status: DC
  Administered 2019-12-11 – 2019-12-15 (×8): 6.25 mg via ORAL
  Filled 2019-12-11 (×8): qty 1

## 2019-12-11 MED ORDER — TRAMADOL HCL 50 MG PO TABS
25.0000 mg | ORAL_TABLET | Freq: Four times a day (QID) | ORAL | Status: DC | PRN
  Administered 2019-12-11 – 2019-12-14 (×3): 25 mg via ORAL
  Filled 2019-12-11 (×3): qty 1

## 2019-12-11 NOTE — Progress Notes (Signed)
Patient turned q2hrs during the day today with small washcloth placed under right shoulder to support shoulder and several blankets placed under right arm to support the right arm.  New dressing placed to sacrum and back (Alevyn foam).

## 2019-12-11 NOTE — Progress Notes (Signed)
Patient has has 3 total intermittent outbursts of anxiety, screaming, and crying out related to having ID bracelets on and was scaping her skin moving the bacelets back and forth on her arm.  I have removed/replaced all to her leg but can't remove the blood bank bracelet, since this can't be resnapped in place once removed, this bracelet is covered with Kerlex to try to avoid her becoming agitated and scraping her skin.

## 2019-12-11 NOTE — Progress Notes (Signed)
Patient has been turned q 2hrs today with small wash cloth placed under right should to support shoulder (in lieu of small pillow). Blankets were used to position and support the right arm.  New foam dressings applied to back and sacrum.  Swelling of right arm observed consistent with bruising and bleeding into arm from fall, provider Dr. Thereasa Solo, MD aware.

## 2019-12-11 NOTE — Progress Notes (Signed)
Pt. Had an uneventful night, she rested well and tolerated the Ativan well. It seems to last about 3-4 hours and she wakes anxious, pulling at things. The tech and myself tried several times to place her arm sling back on, pt STATED very clear "I don't want that on" I was amazed as this was the first thing that I was able to understand. Will make another attempt before end of shift.

## 2019-12-11 NOTE — Progress Notes (Signed)
Paged provider to notify of patient's refusal of reapplication of rt arm sling this morning and history of refusal and agitation of having the sling on in the past 24 hrs. Provider doctor, Dr. Thereasa Solo, MD was made aware of the patient's episodes of anxiety associated with wearing the sling and her refusal to have the sling place back on.  Per provider, it would be more ideal for her to have the sling on since a fractured humerus cannot properly heal without a sling, however if the patient cannot or will not tolerate the sling, there may be nothing that can be done other than positive encoragement.  Will continue to monitor and encourage patient to wear her sling.

## 2019-12-11 NOTE — Progress Notes (Signed)
   12/11/19 0900  Clinical Encounter Type  Visited With Patient and family together  Visit Type Initial  Referral From Nurse;Family  Consult/Referral To Chaplain  Spiritual Encounters  Spiritual Needs Prayer   Chaplain engaged in initial visit with Jennifer Rocha and her son Jennifer Rocha.  Chaplain strived to get to know Jennifer Rocha through her son Jennifer Rocha but he noted maybe it was best to learn more about his mother through his sister Jennifer Rocha who will be visiting later today.  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 Chaplain offered prayer around [place] and will follow-up as needed.

## 2019-12-11 NOTE — Progress Notes (Signed)
Jennifer Rocha  LOV:[id number] DOB: December 18, 1927 DOA: 12/07/2019 PCP: Reynold Bowen, MD    Brief Narrative:  86 year old with a history of chronic combined CHF, HTN, and HLD who presented to the ED after suffering a fall and being found down after prolonged period time.  The exact events leading to her fall were not clear.  The patient called her daughter reporting that she was unable to get up and was found to be confused.  On further history family did report progressively worsening confusion over a month or more.  Full evaluation in the ED reveals a humeral neck fracture as well as mild rhabdomyolysis.  Orthopedics evaluated the patient and recommended the splint with outpatient follow-up.  Antimicrobials:  None  Subjective: Continues to require consistent Ativan dosing to prevent severe agitation.  Sedate but resting comfortably at time of visit.  No evidence of respiratory distress.  No evidence of uncontrolled pain.  No family present at time of my visit  Assessment & Plan:  Acute ischemic stroke Due to persistent confusion and speech difficulty MRI of th the e brain was accomplished 7/15 and revealed an extensive left MCA CVA as well as a small CVA in the right frontal with a subarachnoid hemorrhage - felt to likely be cardioembolic -patient not currently an anticoagulation candidate therefore no further cardioembolic work-up indicated at present -follow-up in clinic with Dr. Leonie Man in 4 weeks to determine if embolic work-up can be completed at that time based upon candidacy for anticoagulation  Subarachnoid hemorrhage and intraventricular hemorrhage Not originally commented on at time of CT head 7/13 but noted on MRI 7/15 and retrospectively felt to have been present at time of 7/13 CT - Stroke Team feels this is due to hemorrhagic conversion from her CVA -clear to begin aspirin per stroke team  Acute alcohol withdrawal - Alcohol dependence Continue thiamine support and Ativan  withdrawal protocol  Altered mental status /acute metabolic encephalopathy Family reported patient has had memory issues for last few months - CT head at time of admission noted only atrophy and chronic microvascular disease -appears to be in full-blown alcohol withdrawal at present requiring frequent Ativan dosing  Traumatic rhabdomyolysis Due to fall and prolonged downtime -CK peaked at 2943 -has consistently trended downward with IV resuscitation  Right humerus fracture Evaluated by Trauma Ortho at time of presentation -plan is for sling and outpatient monitoring -hopeful to avoid surgery -patient is frequently refusing to wear sling and this may interfere with her healing  Traumatic fall Patient underwent multiple radiological studies including pelvic x-ray, x-ray of the left foot, right shoulder, CT scan of the head, CT scan of the cervical spine, CT scan of the maxillofacial area, CT scan of the chest abdomen and pelvis.  Apart from the injury to her humerus no other injuries were noted.  Macrocytic anemia -anemia of acute blood loss Significant blood loss felt to be related to bleeding into forearm and chest related to her humeral fracture - status post 1 unit PRBC 7/15 with good response -hemoglobin stable today  Essential hypertension With goal to keep systolic below 884 in setting of subarachnoid/IVH -add clonidine today  Chronic combined systolic and diastolic CHF No old TTE in our system -stress test records from 2017 suggest EF 45-55% -clinically compensated -TTE this admit notes EF 55-60% and indeterminate diastolic parameters -no evidence of volume overload at present  HLD On chronic Lipitor  Goals of care Given advanced age and history suggestive of progressive dementia Palliative Care  has been consulted to consider goals of care   DVT prophylaxis: SCDs Code Status: DNR Family Communication:  Status is: Inpatient  Remains inpatient appropriate because:Inpatient  level of care appropriate due to severity of illness   Dispo:  Patient From: Home  Planned Disposition: To be determined  Expected discharge date: 12/13/19  Medically stable for discharge: No   Consultants:  Neurology Trauma Ortho Palliative Care  Objective: Blood pressure 138/75, pulse 94, temperature 98.8 F (37.1 C), temperature source Axillary, resp. rate 18, height 5\' 3"  (1.6 m), weight 65 kg, SpO2 97 %.  Intake/Output Summary (Last 24 hours) at 12/11/2019 0946 Last data filed at 12/10/2019 2300 Gross per 24 hour  Intake -  Output 1425 ml  Net -1425 ml   Filed Weights   12/07/19 0901  Weight: 65 kg    Examination: General: No acute respiratory distress -sedate time of exam Lungs: CTA B without wheezing Cardiovascular: Regular rate and rhythm  Abdomen: Nondistended, soft, BS positive Extremities: No significant edema bilateral lower extremities  CBC: Recent Labs  Lab 12/09/19 0241 12/09/19 0241 12/09/19 1719 12/10/19 0409 12/11/19 0725  WBC 9.0  --   --  9.2 8.2  HGB 6.9*   < > 8.6* 9.3* 10.0*  HCT 22.5*   < > 27.3* 29.3* 30.9*  MCV 107.7*  --   --  101.0* 100.3*  PLT 230  --   --  269 302   < > = values in this interval not displayed.   Basic Metabolic Panel: Recent Labs  Lab 12/08/19 0512 12/08/19 0512 12/09/19 0241 12/10/19 0409 12/11/19 0725  NA 140   < > 140 138 137  K 3.0*   < > 3.7 3.5 3.3*  CL 105   < > 108 104 99  CO2 27   < > 25 27 28   GLUCOSE 104*   < > 101* 101* 95  BUN 20   < > 14 8 5*  CREATININE 0.49   < > 0.41* 0.37* 0.38*  CALCIUM 8.3*   < > 8.1* 8.6* 8.5*  MG 2.2  --   --   --   --    < > = values in this interval not displayed.   GFR: Estimated Creatinine Clearance: 42.4 mL/min (A) (by C-G formula based on SCr of 0.38 mg/dL (L)).  Liver Function Tests: Recent Labs  Lab 12/07/19 0841 12/09/19 0241 12/10/19 0409 12/11/19 0725  AST 92* 44* 36 28  ALT 55* 39 35 30  ALKPHOS 63 50 55 61  BILITOT 2.2* 1.4* 2.1* 2.7*   PROT 6.5 5.0* 5.5* 5.4*  ALBUMIN 3.7 2.8* 3.0* 2.9*    Coagulation Profile: Recent Labs  Lab 12/07/19 0841  INR 1.0    Cardiac Enzymes: Recent Labs  Lab 12/07/19 0841 12/08/19 0512 12/09/19 0241  CKTOTAL 2,943* 1,102* 674*    HbA1C: Hgb A1c MFr Bld  Date/Time Value Ref Range Status  12/10/2019 04:09 AM 5.1 4.8 - 5.6 % Final    Comment:    (NOTE) Pre diabetes:          5.7%-6.4%  Diabetes:              >6.4%  Glycemic control for   <7.0% adults with diabetes      Recent Results (from the past 240 hour(s))  SARS Coronavirus 2 by RT PCR (hospital order, performed in [facility] hospital lab) Nasopharyngeal Nasopharyngeal Swab     Status: None   Collection Time: 12/07/19 11:39  AM   Specimen: Nasopharyngeal Swab  Result Value Ref Range Status   SARS Coronavirus 2 NEGATIVE NEGATIVE Final    Comment: (NOTE) SARS-CoV-2 target nucleic acids are NOT DETECTED.  The SARS-CoV-2 RNA is generally detectable in upper and lower respiratory specimens during the acute phase of infection. The lowest concentration of SARS-CoV-2 viral copies this assay can detect is 250 copies / mL. A negative result does not preclude SARS-CoV-2 infection and should not be used as the sole basis for treatment or other patient management decisions.  A negative result may occur with improper specimen collection / handling, submission of specimen other than nasopharyngeal swab, presence of viral mutation(s) within the areas targeted by this assay, and inadequate number of viral copies (<250 copies / mL). A negative result must be combined with clinical observations, patient history, and epidemiological information.  Fact Sheet for Patients:   StrictlyIdeas.no  Fact Sheet for Healthcare Providers: BankingDealers.co.za  This test is not yet approved or  cleared by the [place] FDA and has been authorized for detection and/or diagnosis of  SARS-CoV-2 by FDA under an Emergency Use Authorization (EUA).  This EUA will remain in effect (meaning this test can be used) for the duration of the COVID-19 declaration under Section 564(b)(1) of the Act, 21 U.S.C. section 360bbb-3(b)(1), unless the authorization is terminated or revoked sooner.  Performed at [facility], [place] [address], [place], [place] [address]      Scheduled Meds: . acetaminophen  650 mg Oral Q6H  . aspirin EC  81 mg Oral Daily  . atorvastatin  40 mg Oral Daily  . cyanocobalamin  1,000 mcg Subcutaneous Daily  . docusate sodium  100 mg Oral BID  . DULoxetine  30 mg Oral Daily  . folic acid  1 mg Oral Daily  . LORazepam  0-4 mg Intravenous Q4H   Followed by  . [START ON 12/12/2019] LORazepam  0-4 mg Intravenous Q8H  . mouth rinse  15 mL Mouth Rinse BID  . multivitamin with minerals  1 tablet Oral Daily  . thiamine  100 mg Oral Daily   Or  . thiamine  100 mg Intravenous Daily   Continuous Infusions: . methocarbamol (ROBAXIN) IV       LOS: 4 days   Cherene Altes, MD [facility] Office  [phone] Pager - Text Page per Amion  If 7PM-7AM, please contact night-coverage per Amion 12/11/2019, 9:46 AM

## 2019-12-11 NOTE — Progress Notes (Signed)
   Palliative Medicine Inpatient Follow Up Note  HPI: 86 y.o. female   admitted on 12/07/2019 with   medical history significant ofchronic combined CHF; HTN; and HLD presenting with a fall at home with prolonged downtime.  Admitted with rhabdomyolysis, and humeral head fracture on the right.  Today's Discussion (12/11/2019): Chart reviewed. I met with [name] (daughter) at bedside. She shares that Madison had a better night. She had gotten a bit agitated earlier in the setting of having her arm sling on which had since been removed. We discussed nutrition, she was in agreement with RD coming by to assess her caloric needs. We discussed her pain and initiating low dose tramadol for comfort. Emphasized the importance of mobility.   Discussed the importance of continued conversation with family and their  medical providers regarding overall plan of care and treatment options, ensuring decisions are within the context of the patients values and GOCs.  Questions and concerns addressed   SUMMARY OF RECOMMENDATIONS   DNAR/DNI  MOST Completed, paper copy placed onto the chart electric copy can be found in the media section of epic  DNR Form Completed, paper copy placed onto the chart electric copy can be found in the media section of epic  Patients family would like to see if she improves with present interventions  Dietary consult  Tramadol 36m PO Q6H PRN  PMT will remain involved to offer ongoing support   Time Spent: 25 Greater than 50% of the time was spent in counseling and coordination of care ______________________________________________________________________________________ [facility] Team Cell Phone: [phone] utilize secure chat with additional questions, if there is no response within 30 minutes please call the above phone number  Palliative Medicine Team providers are available by phone from 7am to 7pm daily and can be  reached through the team cell phone.  Should this patient require assistance outside of these hours, please call the patient's attending physician.

## 2019-12-12 LAB — COMPREHENSIVE METABOLIC PANEL
ALT: 26 U/L (ref 0–44)
AST: 26 U/L (ref 15–41)
Albumin: 2.9 g/dL — ABNORMAL LOW (ref 3.5–5.0)
Alkaline Phosphatase: 58 U/L (ref 38–126)
Anion gap: 9 (ref 5–15)
BUN: 8 mg/dL (ref 8–23)
CO2: 29 mmol/L (ref 22–32)
Calcium: 8.8 mg/dL — ABNORMAL LOW (ref 8.9–10.3)
Chloride: 99 mmol/L (ref 98–111)
Creatinine, Ser: 0.45 mg/dL (ref 0.44–1.00)
GFR calc Af Amer: >60 mL/min (ref >60)
GFR calc non Af Amer: >60 mL/min (ref >60)
Glucose, Bld: 109 mg/dL — ABNORMAL HIGH (ref 70–99)
Potassium: 3.1 mmol/L — ABNORMAL LOW (ref 3.5–5.1)
Sodium: 137 mmol/L (ref 135–145)
Total Bilirubin: 2.3 mg/dL — ABNORMAL HIGH (ref 0.3–1.2)
Total Protein: 5.4 g/dL — ABNORMAL LOW (ref 6.5–8.1)

## 2019-12-12 LAB — MAGNESIUM: Magnesium: 1.9 mg/dL (ref 1.7–2.4)

## 2019-12-12 LAB — PHOSPHORUS: Phosphorus: 3.7 mg/dL (ref 2.5–4.6)

## 2019-12-12 LAB — CBC
HCT: 30.8 % — ABNORMAL LOW (ref 36.0–46.0)
Hemoglobin: 10 g/dL — ABNORMAL LOW (ref 12.0–15.0)
MCH: 32.5 pg (ref 26.0–34.0)
MCHC: 32.5 g/dL (ref 30.0–36.0)
MCV: 100 fL (ref 80.0–100.0)
Platelets: 303 10*3/uL (ref 150–400)
RBC: 3.08 MIL/uL — ABNORMAL LOW (ref 3.87–5.11)
RDW: 16.3 % — ABNORMAL HIGH (ref 11.5–15.5)
WBC: 9.3 10*3/uL (ref 4.0–10.5)
nRBC: 0.2 % (ref 0.0–0.2)

## 2019-12-12 LAB — CK: Total CK: 144 U/L (ref 38–234)

## 2019-12-12 MED ORDER — POTASSIUM CHLORIDE 10 MEQ/100ML IV SOLN
10.0000 meq | INTRAVENOUS | Status: AC
  Administered 2019-12-12 (×4): 10 meq via INTRAVENOUS
  Filled 2019-12-12 (×4): qty 100

## 2019-12-12 NOTE — Progress Notes (Signed)
Jennifer Rocha  ZJQ:[id number] DOB: 1927-09-26 DOA: 12/07/2019 PCP: Reynold Bowen, MD    Brief Narrative:  86 year old with a history of chronic combined CHF, HTN, and HLD who presented to the ED 12/07/19 after suffering a fall and being found down after prolonged period time.  The exact events leading to her fall were not clear.  The patient called her daughter reporting that she was unable to get up and was found to be confused.  On further history family did report progressively worsening confusion over a month or more.  Full evaluation in the ED revealed a humeral neck fracture as well as mild rhabdomyolysis.  Orthopedics evaluated the patient and recommended a splint with outpatient follow-up.  Antimicrobials:  None  Subjective: Awake at the time of my exam.  Will follow the examiner with her eyes and smiled back but does not attempt to speak.  Does not follow simple commands.  I spoke with the patient's son at the bedside who had no specific questions at the time.  Assessment & Plan:  Acute ischemic stroke Due to persistent confusion and speech difficulty MRI of th the brain was accomplished 7/15 and revealed an extensive left MCA CVA as well as a small CVA in the right frontal lobe with a subarachnoid hemorrhage - felt to likely be cardioembolic -patient not currently an anticoagulation candidate therefore no further cardioembolic work-up indicated at present -follow-up in clinic with Dr. Leonie Man in 4 weeks to determine if embolic work-up can be completed at that time based upon candidacy for anticoagulation - PT/OT once EtOH withdrawal more stable   Subarachnoid hemorrhage and intraventricular hemorrhage Not originally commented on at time of CT head 7/13 but noted on MRI 7/15 and retrospectively felt to have been present at time of 7/13 CT - Stroke Team feels this is due to hemorrhagic conversion from her CVA -aspirin has been initiated as per stroke team recommendation  Acute alcohol  withdrawal - Alcohol dependence Continue thiamine support and Ativan withdrawal protocol - slow to improve   Hypokalemia Supplement to goal of 4.0 -magnesium acceptable at 1.9  Altered mental status /acute metabolic encephalopathy Family reported patient has had memory issues for last few months - appears to be in full-blown alcohol withdrawal at present requiring frequent Ativan dosing  Traumatic rhabdomyolysis Due to fall and prolonged downtime -CK peaked at 2943 -has consistently trended downward with IV resuscitation and is now confirmed to be normal  Right humerus fracture Evaluated by Trauma Ortho at time of presentation -plan is for sling and outpatient monitoring -hopeful to avoid surgery -patient is frequently refusing to wear sling and this may interfere with her healing  Traumatic fall Patient underwent multiple radiological studies including pelvic x-ray, x-ray of the left foot, right shoulder, CT scan of the head, CT scan of the cervical spine, CT scan of the maxillofacial area, CT scan of the chest abdomen and pelvis.  Apart from the injury to her humerus no other injuries were noted.  Macrocytic anemia -anemia of acute blood loss Significant blood loss felt to be related to bleeding into forearm and chest related to her humeral fracture - status post 1 unit PRBC 7/15 with good response -hemoglobin stable  Essential hypertension With goal to keep systolic below 240 in setting of subarachnoid/IVH -clonidine added during this admission  Chronic combined systolic and diastolic CHF No old TTE in our system -stress test records from 2017 suggest EF 45-55% -clinically compensated -TTE this admit notes EF 55-60% and  indeterminate diastolic parameters -no evidence of volume overload at present  HLD On chronic Lipitor  Goals of care Given advanced age and history suggestive of progressive dementia Palliative Care has been consulted to consider goals of care   DVT prophylaxis:  SCDs Code Status: DNR Family Communication: As above Status is: Inpatient  Remains inpatient appropriate because:Inpatient level of care appropriate due to severity of illness   Dispo:  Patient From: Home  Planned Disposition: To be determined  Expected discharge date: 12/13/19  Medically stable for discharge: No   Consultants:  Neurology Trauma Ortho Palliative Care  Objective: Blood pressure 133/72, pulse 86, temperature 98.2 F (36.8 C), temperature source Oral, resp. rate 17, height 5\' 3"  (1.6 m), weight 65 kg, SpO2 98 %.  Intake/Output Summary (Last 24 hours) at 12/12/2019 0921 Last data filed at 12/12/2019 0938 Gross per 24 hour  Intake 200 ml  Output 1700 ml  Net -1500 ml   Filed Weights   12/07/19 0901  Weight: 65 kg    Examination: General: No acute respiratory distress -alert but confused Lungs: CTA B without wheezing Cardiovascular: RRR Abdomen: Nondistended, soft, BS positive Extremities: No significant edema bilateral LE  CBC: Recent Labs  Lab 12/10/19 0409 12/11/19 0725 12/12/19 0506  WBC 9.2 8.2 9.3  HGB 9.3* 10.0* 10.0*  HCT 29.3* 30.9* 30.8*  MCV 101.0* 100.3* 100.0  PLT 269 302 182   Basic Metabolic Panel: Recent Labs  Lab 12/08/19 0512 12/09/19 0241 12/10/19 0409 12/11/19 0725 12/12/19 0506  NA 140   < > 138 137 137  K 3.0*   < > 3.5 3.3* 3.1*  CL 105   < > 104 99 99  CO2 27   < > 27 28 29   GLUCOSE 104*   < > 101* 95 109*  BUN 20   < > 8 5* 8  CREATININE 0.49   < > 0.37* 0.38* 0.45  CALCIUM 8.3*   < > 8.6* 8.5* 8.8*  MG 2.2  --   --   --  1.9  PHOS  --   --   --   --  3.7   < > = values in this interval not displayed.   GFR: Estimated Creatinine Clearance: 42.4 mL/min (by C-G formula based on SCr of 0.45 mg/dL).  Liver Function Tests: Recent Labs  Lab 12/09/19 0241 12/10/19 0409 12/11/19 0725 12/12/19 0506  AST 44* 36 28 26  ALT 39 35 30 26  ALKPHOS 50 55 61 58  BILITOT 1.4* 2.1* 2.7* 2.3*  PROT 5.0* 5.5* 5.4*  5.4*  ALBUMIN 2.8* 3.0* 2.9* 2.9*    Coagulation Profile: Recent Labs  Lab 12/07/19 0841  INR 1.0    Cardiac Enzymes: Recent Labs  Lab 12/07/19 0841 12/08/19 0512 12/09/19 0241 12/12/19 0506  CKTOTAL 2,943* 1,102* 674* 144    HbA1C: Hgb A1c MFr Bld  Date/Time Value Ref Range Status  12/10/2019 04:09 AM 5.1 4.8 - 5.6 % Final    Comment:    (NOTE) Pre diabetes:          5.7%-6.4%  Diabetes:              >6.4%  Glycemic control for   <7.0% adults with diabetes      Recent Results (from the past 240 hour(s))  SARS Coronavirus 2 by RT PCR (hospital order, performed in [place] hospital lab) Nasopharyngeal Nasopharyngeal Swab     Status: None   Collection Time: 12/07/19 11:39 AM  Specimen: Nasopharyngeal Swab  Result Value Ref Range Status   SARS Coronavirus 2 NEGATIVE NEGATIVE Final    Comment: (NOTE) SARS-CoV-2 target nucleic acids are NOT DETECTED.  The SARS-CoV-2 RNA is generally detectable in upper and lower respiratory specimens during the acute phase of infection. The lowest concentration of SARS-CoV-2 viral copies this assay can detect is 250 copies / mL. A negative result does not preclude SARS-CoV-2 infection and should not be used as the sole basis for treatment or other patient management decisions.  A negative result may occur with improper specimen collection / handling, submission of specimen other than nasopharyngeal swab, presence of viral mutation(s) within the areas targeted by this assay, and inadequate number of viral copies (<250 copies / mL). A negative result must be combined with clinical observations, patient history, and epidemiological information.  Fact Sheet for Patients:   StrictlyIdeas.no  Fact Sheet for Healthcare Providers: BankingDealers.co.za  This test is not yet approved or  cleared by the [place] FDA and has been authorized for detection and/or diagnosis of  SARS-CoV-2 by FDA under an Emergency Use Authorization (EUA).  This EUA will remain in effect (meaning this test can be used) for the duration of the COVID-19 declaration under Section 564(b)(1) of the Act, 21 U.S.C. section 360bbb-3(b)(1), unless the authorization is terminated or revoked sooner.  Performed at [facility], [place] [address] [place], [place] [address]      Scheduled Meds: . acetaminophen  650 mg Oral Q6H  . aspirin EC  81 mg Oral Daily  . atorvastatin  40 mg Oral Daily  . carvedilol  6.25 mg Oral BID  . cloNIDine  0.1 mg Transdermal Weekly  . cyanocobalamin  1,000 mcg Subcutaneous Daily  . docusate sodium  100 mg Oral BID  . DULoxetine  30 mg Oral Daily  . folic acid  1 mg Oral Daily  . LORazepam  0-4 mg Intravenous Q4H   Followed by  . LORazepam  0-4 mg Intravenous Q8H  . mouth rinse  15 mL Mouth Rinse BID  . multivitamin with minerals  1 tablet Oral Daily  . thiamine  100 mg Oral Daily   Or  . thiamine  100 mg Intravenous Daily     LOS: 5 days   Cherene Altes, MD [facility] Office  [phone] Pager - Text Page per Amion  If 7PM-7AM, please contact night-coverage per Amion 12/12/2019, 9:21 AM

## 2019-12-12 NOTE — Progress Notes (Signed)
Patient given 2 mg Ativan for CIWA 13.  Patient repositioned in bed small pillow under right shoulder and blankets/pillow to support right arm.

## 2019-12-12 NOTE — Progress Notes (Signed)
   Palliative Medicine Inpatient Follow Up Note  HPI: 86 y.o. female   admitted on 12/07/2019 with   medical history significant ofchronic combined CHF; HTN; and HLD presenting with a fall at home with prolonged downtime.  Admitted with rhabdomyolysis, and humeral head fracture on the right.  Today's Discussion (12/12/2019): Chart reviewed. I met with Jennifer Rocha this morning at bedside with her RN, Lattie Haw. [name] was more alert this morning. We repositioned her in the bed. She was not noted to be in any distress at this time. The tramadol ordered yesterday did seem to bring patient some relief per nursing.   As of the present time, patients family are optimistic for improvements. The would like to continue with the plan for skilled placement to identify if additional muscular gains can be made.   Discussed the importance of continued conversation with family and their  medical providers regarding overall plan of care and treatment options, ensuring decisions are within the context of the patients values and GOCs.  Questions and concerns addressed   SUMMARY OF RECOMMENDATIONS   DNAR/DNI  MOST Completed, paper copy placed onto the chart electric copy can be found in the media section of epic  DNR Form Completed, paper copy placed onto the chart electric copy can be found in the media section of epic  Patients family would like to see if she improves with present interventions  Dietary consult  Tramadol 74m PO Q6H PRN  PMT will remain involved to offer ongoing support   Time Spent: 25 Greater than 50% of the time was spent in counseling and coordination of care ______________________________________________________________________________________ [facility] Team Cell Phone: [phone] utilize secure chat with additional questions, if there is no response within 30 minutes please call the above phone number  Palliative Medicine Team  providers are available by phone from 7am to 7pm daily and can be reached through the team cell phone.  Should this patient require assistance outside of these hours, please call the patient's attending physician.

## 2019-12-12 NOTE — Progress Notes (Signed)
Patient tore out both peripheral IV's related to agitation. Awaiting new IV placement by IV team.

## 2019-12-12 NOTE — Progress Notes (Addendum)
2 mg Ativan given for CIWA 12. Pt pulling at IV tubing, pulling off telemetry leads, throwing pillows across room with left hand, moaning and screaming out into the hallway.

## 2019-12-13 ENCOUNTER — Inpatient Hospital Stay (HOSPITAL_COMMUNITY): Payer: Medicare Other

## 2019-12-13 LAB — CBC
HCT: 34 % — ABNORMAL LOW (ref 36.0–46.0)
Hemoglobin: 10.9 g/dL — ABNORMAL LOW (ref 12.0–15.0)
MCH: 32.4 pg (ref 26.0–34.0)
MCHC: 32.1 g/dL (ref 30.0–36.0)
MCV: 101.2 fL — ABNORMAL HIGH (ref 80.0–100.0)
Platelets: 335 10*3/uL (ref 150–400)
RBC: 3.36 MIL/uL — ABNORMAL LOW (ref 3.87–5.11)
RDW: 16.1 % — ABNORMAL HIGH (ref 11.5–15.5)
WBC: 10.6 10*3/uL — ABNORMAL HIGH (ref 4.0–10.5)
nRBC: 0 % (ref 0.0–0.2)

## 2019-12-13 LAB — BASIC METABOLIC PANEL
Anion gap: 9 (ref 5–15)
BUN: 7 mg/dL — ABNORMAL LOW (ref 8–23)
CO2: 28 mmol/L (ref 22–32)
Calcium: 9 mg/dL (ref 8.9–10.3)
Chloride: 100 mmol/L (ref 98–111)
Creatinine, Ser: 0.41 mg/dL — ABNORMAL LOW (ref 0.44–1.00)
GFR calc Af Amer: >60 mL/min (ref >60)
GFR calc non Af Amer: >60 mL/min (ref >60)
Glucose, Bld: 126 mg/dL — ABNORMAL HIGH (ref 70–99)
Potassium: 3.4 mmol/L — ABNORMAL LOW (ref 3.5–5.1)
Sodium: 137 mmol/L (ref 135–145)

## 2019-12-13 MED ORDER — ENSURE ENLIVE PO LIQD
237.0000 mL | Freq: Three times a day (TID) | ORAL | Status: DC
  Administered 2019-12-13 – 2019-12-15 (×8): 237 mL via ORAL

## 2019-12-13 MED ORDER — POTASSIUM CHLORIDE 10 MEQ/100ML IV SOLN
10.0000 meq | INTRAVENOUS | Status: AC
  Administered 2019-12-13 (×4): 10 meq via INTRAVENOUS
  Filled 2019-12-13 (×4): qty 100

## 2019-12-13 MED ORDER — CLONIDINE HCL 0.2 MG/24HR TD PTWK
0.2000 mg | MEDICATED_PATCH | TRANSDERMAL | Status: DC
  Administered 2019-12-13: 0.2 mg via TRANSDERMAL
  Filled 2019-12-13: qty 1

## 2019-12-13 MED ORDER — LIP MEDEX EX OINT
1.0000 "application " | TOPICAL_OINTMENT | CUTANEOUS | Status: DC | PRN
  Filled 2019-12-13: qty 7

## 2019-12-13 NOTE — Progress Notes (Signed)
Physical Therapy Treatment Patient Details Name: Jennifer Rocha MRN: [id number] DOB: Apr 29, 1928 Today's Date: 12/13/2019    History of Present Illness Pt is a 86 y.o. F with significant PMH of CHF, HTN, who presents after a fall and extended time down with rhabdomyolysis, right humerus fracture and acute metabolic encephalopathy.    PT Comments    Pt lethargic at times during session but alert with eyes open when PT arrived. Pt making minimal attempts to converse, even when asked specific questions. Daughter present and reports the same behavior prior to therapist's entry. Pt was able to participate in EOB activity however was unable to tolerate standing or transfer to chair. Pt was left in chair position in the bed with RUE supported on pillows and ice pack placed on shoulder per daughter's request. Will continue to follow and progress as able per POC.    Follow Up Recommendations  SNF;Supervision/Assistance - 24 hour     Equipment Recommendations  Other (comment) (defer to next venue)    Recommendations for Other Services       Precautions / Restrictions Precautions Precautions: Fall Required Braces or Orthoses: Sling Restrictions Weight Bearing Restrictions: Yes RUE Weight Bearing: Non weight bearing    Mobility  Bed Mobility Overal bed mobility: Needs Assistance Bed Mobility: Rolling;Sidelying to Sit;Sit to Sidelying Rolling: Mod assist;+2 for physical assistance Sidelying to sit: Mod assist;+2 for physical assistance     Sit to sidelying: Mod assist;+2 for physical assistance General bed mobility comments: Minimal effort throughout bed mobility activity - likely due to Ativan given earlier this morning.   Transfers Overall transfer level: Needs assistance Equipment used: 2 person hand held assist Transfers: Sit to/from Stand Sit to Stand: Total assist;+2 physical assistance         General transfer comment: Unable to fully clear hips from the bed on standing  attempt  Ambulation/Gait             General Gait Details: Unable to progress to gait training this session   Stairs             Wheelchair Mobility    Modified Rankin (Stroke Patients Only)       Balance Overall balance assessment: Needs assistance Sitting-balance support: Feet supported;No upper extremity supported Sitting balance-Leahy Scale: Fair     Standing balance support: Single extremity supported;During functional activity Standing balance-Leahy Scale: Poor Standing balance comment: reliant on external support                            Cognition Arousal/Alertness: Awake/alert Behavior During Therapy: Flat affect Overall Cognitive Status: Impaired/Different from baseline Area of Impairment: Following commands;Orientation;Attention;Safety/judgement;Awareness;Problem solving                 Orientation Level: Disoriented to;Place;Time;Situation (looks at therapist when I say her name but does not respond) Current Attention Level: Focused Memory: Decreased recall of precautions Following Commands: Follows one step commands consistently;Follows one step commands with increased time Safety/Judgement: Decreased awareness of safety;Decreased awareness of deficits Awareness: Intellectual Problem Solving: Decreased initiation;Slow processing;Difficulty sequencing;Requires verbal cues;Requires tactile cues        Exercises      General Comments General comments (skin integrity, edema, etc.): Daughter present during session Hospital doctor)      Pertinent Vitals/Pain Pain Assessment: Faces Faces Pain Scale: No hurt Pain Location: Pt nods head yes when asked about pain but does not indicate her shoulder, does not rate pain on 0-10  scale, and does not grimace or appear to be in pain during session.     Home Living                      Prior Function            PT Goals (current goals can now be found in the care plan section)  Acute Rehab PT Goals Patient Stated Goal: None stated. Pt lethargic due to Ativan given earlier this morning PT Goal Formulation: With family Time For Goal Achievement: 12/22/19 Potential to Achieve Goals: Fair Progress towards PT goals: Progressing toward goals    Frequency    Min 3X/week      PT Plan Current plan remains appropriate    Co-evaluation              AM-PAC PT "6 Clicks" Mobility   Outcome Measure  Help needed turning from your back to your side while in a flat bed without using bedrails?: A Lot Help needed moving from lying on your back to sitting on the side of a flat bed without using bedrails?: Total Help needed moving to and from a bed to a chair (including a wheelchair)?: Total Help needed standing up from a chair using your arms (e.g., wheelchair or bedside chair)?: Total Help needed to walk in hospital room?: Total Help needed climbing 3-5 steps with a railing? : Total 6 Click Score: 7    End of Session Equipment Utilized During Treatment: Gait belt;Other (comment) (RUE sling) Activity Tolerance: Patient tolerated treatment well Patient left: in chair;with call bell/phone within reach;with chair alarm set;with family/visitor present Nurse Communication: Mobility status PT Visit Diagnosis: Pain;Difficulty in walking, not elsewhere classified (R26.2);Unsteadiness on feet (R26.81) Pain - Right/Left: Right Pain - part of body: Shoulder     Time: 5397-6734 PT Time Calculation (min) (ACUTE ONLY): 29 min  Charges:  $Therapeutic Activity: 23-37 mins                     Rolinda Roan, PT, DPT Acute Rehabilitation Services Pager: [phone] Office: [phone]    Thelma Comp 12/13/2019, 3:38 PM

## 2019-12-13 NOTE — TOC Progression Note (Signed)
Transition of Care [facility]) - Progression Note    Patient Details  Name: Jennifer Rocha MRN: [id number] Date of Birth: 07-17-1927  Transition of Care [facility]) CM/SW [place], [place] Phone Number: 12/13/2019, 3:54 PM  Clinical Narrative:     CSW spoke to pts daughter, [name] via phone. CSW informed [name] that pt was denied by [place] and [place] due to bed availability. Pt inquired about [facility], CSW informed her that [facility] is still [facility] and will follow up. [name] asked about what will happen if pt gets denied from [place] [facility]. CSW requested that they fax pt out to other snfs in [place] to have more choices as backup. Pts daughter agreed to send out but [facility] is priority.    Expected Discharge Plan: [place] Barriers to Discharge: Continued Medical Work up  Expected Discharge Plan and Services Expected Discharge Plan: [place] Acute Care Choice: NA Living arrangements for the past 2 months: Single Family Home                                       Social Determinants of Health (SDOH) Interventions    Readmission Risk Interventions No flowsheet data found.  Emeterio Reeve, Latanya Presser, [place] Social Worker [phone]

## 2019-12-13 NOTE — Progress Notes (Signed)
Modified Barium Swallow Progress Note  Patient Details  Name: Jennifer Rocha MRN: [id number] Date of Birth: 07-11-1927  Today's Date: 12/13/2019  Modified Barium Swallow completed.  Full report located under Chart Review in the Imaging Section.  Brief recommendations include the following:  Clinical Impression  Pt presents with a mild oropharyngeal dysphagia marked by difficulty with bolus cohesion, piecemeal release into the pharynx, premature loss of thin liquids into the pharynx, and the presence of oral residue post-swallow.  Due to issues in timing, thin and nectar-thick liquids were both observed, on one occasion each, to be aspirated before the onset of laryngeal vestibule closure (LVC). LVC was complete.  Pt did produce a spontaneous cough in reaction to the trace aspiration events, which will allow caregivers at bedside to make reasonable observations about the presence/absence of aspiration.  Pt has been tolerating a dysphagia 1 diet with thin liquids for several days with no deterioration in BS.  Recommend advancing solids to dysphagia 2, continue thin liquids; give meds whole in puree.  Pt's daughter, [name], was present for study - we discussed results and recommendations.  SLP will follow during course of hospital admission.    Swallow Evaluation Recommendations       SLP Diet Recommendations: Dysphagia 2 (Fine chop) solids;Thin liquid   Liquid Administration via: Straw;Cup   Medication Administration: Whole meds with liquid   Supervision: Full assist for feeding   Compensations: Minimize environmental distractions;Slow rate;Small sips/bites;Follow solids with liquid   Postural Changes: Remain semi-upright after after feeds/meals (Comment)   Oral Care Recommendations: Oral care BID      Json Koelzer L. Tivis Ringer, [place] Office number [phone] Pager [phone]   Juan Quam Laurice 12/13/2019,2:48 PM

## 2019-12-13 NOTE — Progress Notes (Signed)
Patient ID: Jennifer Rocha, female   DOB: 1928/04/18, 86 y.o.   MRN: [id number]  This NP visited patient at the bedside as a follow up for palliative medicine needs and emotional support. Patient is more alert today, eyes are clear and she is smiling.  I spoke with both daughter/ [name] and son/Steve at bedside for continued conversation regarding current medical situation.  Plan is for patient to transition to skilled nursing facility for ongoing rehabilitation.  Family remains hopeful for improvement   Although intake has been improving we discussed her high risk for decompensation secondary to her possible reduced p.o. intake into the future. Education offered on the concepts of adult failure to thrive and the limitations of medical interventions to prolong quality of life when the body does fail to thrive.  Recommend outpatient community-based palliative to follow on discharge.  Discussed with family the importance of continued conversation with each other and their  medical providers regarding overall plan of care and treatment options,  ensuring decisions are within the context of the patients values and GOCs.  Questions and concerns addressed     Total time spent on the unit was 25 minutes  Greater than 50% of the time was spent in counseling and coordination of care  Wadie Lessen NP  Palliative Medicine Team Team Phone # [phone] Pager [phone]

## 2019-12-13 NOTE — TOC Progression Note (Signed)
Transition of Care [facility]) - Progression Note    Patient Details  Name: SHEFALI NG MRN: [id number] Date of Birth: 1927-10-20  Transition of Care [facility]) CM/SW [place], [place] Phone Number: 12/13/2019, 1:12 PM  Clinical Narrative:   CSW following for SNF placement. [facility] and [facility] have both declined to offer a bed for the patient as they have no beds available. [facility] suggested contacting [facility]. CSW attempted to contact patient's daughter, [name], to update her and discuss other SNF options; left a voicemail.     Expected Discharge Plan: [place] Barriers to Discharge: Continued Medical Work up  Expected Discharge Plan and Services Expected Discharge Plan: [place] Acute Care Choice: NA Living arrangements for the past 2 months: Single Family Home                                       Social Determinants of Health (SDOH) Interventions    Readmission Risk Interventions No flowsheet data found.

## 2019-12-13 NOTE — Progress Notes (Signed)
This chaplain responded to On-Call chaplain's referral for F/U spiritual care.  The chaplain introduced herself to the Pt., Pt. daughter-[name], and Pt. son-Steve.  The chaplain heard the family's desire to catch up on the Pt. busy day.  F/U spiritual care will continue to be available as needed.

## 2019-12-13 NOTE — Progress Notes (Addendum)
Initial Nutrition Assessment  DOCUMENTATION CODES:   Not applicable  INTERVENTION:   - Ensure Enlive po TID, each supplement provides 350 kcal and 20 grams of protein  - Magic cup TID with meals, each supplement provides 290 kcal and 9 grams of protein  - Continue MVI with minerals daily  NUTRITION DIAGNOSIS:   Inadequate oral intake related to dysphagia, lethargy/confusion as evidenced by per patient/family report, meal completion < 50%.  GOAL:   Patient will meet greater than or equal to 90% of their needs  MONITOR:   PO intake, Supplement acceptance, Labs, Weight trends, Diet advancement  REASON FOR ASSESSMENT:   Consult Assessment of nutrition requirement/status  ASSESSMENT:   86 year old female who presented on 7/13 after a fall. PMH of CHF, HTN, HLD, EtOH dependence. Pt found to have humeral neck fracture, mild rhabdo, and acute ischemic stroke.   Per Palliative notes, no feeding tube. Noted pt had MBS earlier today and diet advanced to dysphagia 2.  Spoke with pt's daughter at bedside. Pt's daughter reports that pt typically has a fair appetite and eats 3 meals daily that the pt cooks herself. Pt's portions are smaller but appropriate for her age per pt's daughter. Pt's daughter has not noticed that pt has lost any weight recently. Reviewed weight history in chart. Weight stable over the last 3 years.  Pt's daughter reports that pt only ate 5-6 bites of lunch. Noted ~25% completed Magic Cup on meal tray. RD encouraged pt's daughter to push Magic Cup and other high calorie, high protein foods. RD to order Ensure Enlive TID for pt to sip on in between meals to aid in meeting kcal and protein needs.  Meal Completion: 30-50%  Medications reviewed and include: colace, folic acid, MVI with minerals, thiamine  Labs reviewed: potassium 3.4, HDL 38  NUTRITION - FOCUSED PHYSICAL EXAM:    Most Recent Value  Orbital Region Mild depletion  Upper Arm Region No depletion   Thoracic and Lumbar Region Mild depletion  Buccal Region No depletion  Temple Region Mild depletion  Clavicle Bone Region Mild depletion  Clavicle and Acromion Bone Region Mild depletion  Scapular Bone Region No depletion  Dorsal Hand Mild depletion  Patellar Region No depletion  Anterior Thigh Region No depletion  Posterior Calf Region No depletion  Edema (RD Assessment) Mild  Hair Reviewed  Eyes Reviewed  Mouth Reviewed  Skin Reviewed  Nails Reviewed       Diet Order:   Diet Order            DIET DYS 2 Room service appropriate? Yes; Fluid consistency: Thin  Diet effective now                 EDUCATION NEEDS:   Education needs have been addressed  Skin:  Skin Assessment: Reviewed RN Assessment  Last BM:  12/08/19  Height:   Ht Readings from Last 1 Encounters:  12/07/19 5\' 3"  (1.6 m)    Weight:   Wt Readings from Last 1 Encounters:  12/07/19 65 kg    Ideal Body Weight:  52.3 kg  BMI:  Body mass index is 25.38 kg/m.  Estimated Nutritional Needs:   Kcal:  1350-1550  Protein:  70-85 grams  Fluid:  1.4-1.6 L    Gaynell Face, MS, RD, LDN Inpatient Clinical Dietitian Please see AMiON for contact information.

## 2019-12-13 NOTE — Progress Notes (Signed)
Jennifer Rocha  HYW:[id number] DOB: 1928-04-15 DOA: 12/07/2019 PCP: Reynold Bowen, MD    Brief Narrative:  86 year old with a history of chronic combined CHF, HTN, and HLD who presented to the ED 12/07/19 after suffering a fall and being found down after prolonged period time.  The exact events leading to her fall were not clear.  The patient called her daughter reporting that she was unable to get up and was found to be confused.  On further history family did report progressively worsening confusion over a month or more.  Full evaluation in the ED revealed a humeral neck fracture as well as mild rhabdomyolysis.  Orthopedics evaluated the patient and recommended a splint with outpatient follow-up.  Antimicrobials:  None  Subjective: Agitation/withrawal persists. Unable to provide a relaible hx. No family present at time of exam today.   Assessment & Plan:  Acute ischemic stroke Due to persistent confusion and speech difficulty MRI of th the brain was accomplished 7/15 and revealed an extensive left MCA CVA as well as a small CVA in the right frontal lobe with a subarachnoid hemorrhage - felt to likely be cardioembolic -patient not currently an anticoagulation candidate therefore no further cardioembolic work-up indicated at present -follow-up in clinic with Dr. Leonie Man in 4 weeks to determine if embolic work-up can be completed at that time based upon candidacy for anticoagulation - PT/OT once EtOH withdrawal more stable   Subarachnoid hemorrhage and intraventricular hemorrhage Not originally commented on at time of CT head 7/13 but noted on MRI 7/15 and retrospectively felt to have been present at time of 7/13 CT - Stroke Team feels this is due to hemorrhagic conversion from her CVA -aspirin has been initiated as per stroke team recommendation  Acute alcohol withdrawal - Alcohol dependence Continue thiamine support and Ativan withdrawal protocol - slow to improve   Hypokalemia Supplement  to goal of 4.0 -magnesium acceptable at 1.9   Altered mental status /acute metabolic encephalopathy Family reported patient has had memory issues for last few months - appears to be in full-blown alcohol withdrawal at present requiring frequent Ativan dosing  Traumatic rhabdomyolysis Due to fall and prolonged downtime -CK peaked at 2943 -has consistently trended downward with IV resuscitation and is confirmed to be normal in f/u  Right humerus fracture Evaluated by Trauma Ortho at time of presentation -plan is for sling and outpatient monitoring -hopeful to avoid surgery -patient is frequently refusing to wear sling and this may interfere with her healing  Traumatic fall Patient underwent multiple radiological studies including pelvic x-ray, x-ray of the left foot, right shoulder, CT scan of the head, CT scan of the cervical spine, CT scan of the maxillofacial area, CT scan of the chest abdomen and pelvis.  Apart from the injury to her humerus no other injuries were noted.  Macrocytic anemia -anemia of acute blood loss Significant blood loss felt to be related to bleeding into forearm and chest related to her humeral fracture - status post 1 unit PRBC 7/15 with good response -hemoglobin stable  Essential hypertension With goal to keep systolic below 485 in setting of subarachnoid/IVH -clonidine added during this admission and increased today  Chronic combined systolic and diastolic CHF No old TTE in our system -stress test records from 2017 suggest EF 45-55% -clinically compensated -TTE this admit notes EF 55-60% and indeterminate diastolic parameters -no evidence of volume overload at present  HLD On chronic Lipitor  Goals of care Given advanced age and history suggestive of  progressive dementia Palliative Care has been consulted to consider goals of care   DVT prophylaxis: SCDs Code Status: DNR Family Communication: as above  Status is: Inpatient  Remains inpatient appropriate  because:Inpatient level of care appropriate due to severity of illness   Dispo:  Patient From: Home  Planned Disposition: To be determined  Expected discharge date: 12/13/19  Medically stable for discharge: No   Consultants:  Neurology Trauma Ortho Palliative Care  Objective: Blood pressure (!) 143/78, pulse (!) 106, temperature 98.2 F (36.8 C), temperature source Oral, resp. rate 18, height 5\' 3"  (1.6 m), weight 65 kg, SpO2 94 %.  Intake/Output Summary (Last 24 hours) at 12/13/2019 1606 Last data filed at 12/13/2019 1100 Gross per 24 hour  Intake 80 ml  Output 300 ml  Net -220 ml   Filed Weights   12/07/19 0901  Weight: 65 kg    Examination: General: No acute respiratory distress -alert and confused Lungs: CTA B without wheezing Cardiovascular: RRR Abdomen: Nondistended, soft, BS positive Extremities: No edema bilateral LE  CBC: Recent Labs  Lab 12/11/19 0725 12/12/19 0506 12/13/19 1017  WBC 8.2 9.3 10.6*  HGB 10.0* 10.0* 10.9*  HCT 30.9* 30.8* 34.0*  MCV 100.3* 100.0 101.2*  PLT 302 303 235   Basic Metabolic Panel: Recent Labs  Lab 12/08/19 0512 12/09/19 0241 12/11/19 0725 12/12/19 0506 12/13/19 1017  NA 140   < > 137 137 137  K 3.0*   < > 3.3* 3.1* 3.4*  CL 105   < > 99 99 100  CO2 27   < > 28 29 28   GLUCOSE 104*   < > 95 109* 126*  BUN 20   < > 5* 8 7*  CREATININE 0.49   < > 0.38* 0.45 0.41*  CALCIUM 8.3*   < > 8.5* 8.8* 9.0  MG 2.2  --   --  1.9  --   PHOS  --   --   --  3.7  --    < > = values in this interval not displayed.   GFR: Estimated Creatinine Clearance: 42.4 mL/min (A) (by C-G formula based on SCr of 0.41 mg/dL (L)).  Liver Function Tests: Recent Labs  Lab 12/09/19 0241 12/10/19 0409 12/11/19 0725 12/12/19 0506  AST 44* 36 28 26  ALT 39 35 30 26  ALKPHOS 50 55 61 58  BILITOT 1.4* 2.1* 2.7* 2.3*  PROT 5.0* 5.5* 5.4* 5.4*  ALBUMIN 2.8* 3.0* 2.9* 2.9*    Coagulation Profile: Recent Labs  Lab 12/07/19 0841  INR 1.0     Cardiac Enzymes: Recent Labs  Lab 12/07/19 0841 12/08/19 0512 12/09/19 0241 12/12/19 0506  CKTOTAL 2,943* 1,102* 674* 144    HbA1C: Hgb A1c MFr Bld  Date/Time Value Ref Range Status  12/10/2019 04:09 AM 5.1 4.8 - 5.6 % Final    Comment:    (NOTE) Pre diabetes:          5.7%-6.4%  Diabetes:              >6.4%  Glycemic control for   <7.0% adults with diabetes      Recent Results (from the past 240 hour(s))  SARS Coronavirus 2 by RT PCR (hospital order, performed in [facility] hospital lab) Nasopharyngeal Nasopharyngeal Swab     Status: None   Collection Time: 12/07/19 11:39 AM   Specimen: Nasopharyngeal Swab  Result Value Ref Range Status   SARS Coronavirus 2 NEGATIVE NEGATIVE Final    Comment: (NOTE)  SARS-CoV-2 target nucleic acids are NOT DETECTED.  The SARS-CoV-2 RNA is generally detectable in upper and lower respiratory specimens during the acute phase of infection. The lowest concentration of SARS-CoV-2 viral copies this assay can detect is 250 copies / mL. A negative result does not preclude SARS-CoV-2 infection and should not be used as the sole basis for treatment or other patient management decisions.  A negative result may occur with improper specimen collection / handling, submission of specimen other than nasopharyngeal swab, presence of viral mutation(s) within the areas targeted by this assay, and inadequate number of viral copies (<250 copies / mL). A negative result must be combined with clinical observations, patient history, and epidemiological information.  Fact Sheet for Patients:   StrictlyIdeas.no  Fact Sheet for Healthcare Providers: BankingDealers.co.za  This test is not yet approved or  cleared by the [place] FDA and has been authorized for detection and/or diagnosis of SARS-CoV-2 by FDA under an Emergency Use Authorization (EUA).  This EUA will remain in effect (meaning this  test can be used) for the duration of the COVID-19 declaration under Section 564(b)(1) of the Act, 21 U.S.C. section 360bbb-3(b)(1), unless the authorization is terminated or revoked sooner.  Performed at [facility], [place] [address] [place], [place] [address]      Scheduled Meds: . acetaminophen  650 mg Oral Q6H  . aspirin EC  81 mg Oral Daily  . atorvastatin  40 mg Oral Daily  . carvedilol  6.25 mg Oral BID  . cloNIDine  0.2 mg Transdermal Weekly  . docusate sodium  100 mg Oral BID  . DULoxetine  30 mg Oral Daily  . feeding supplement (ENSURE ENLIVE)  237 mL Oral TID BM  . folic acid  1 mg Oral Daily  . LORazepam  0-4 mg Intravenous Q8H  . mouth rinse  15 mL Mouth Rinse BID  . multivitamin with minerals  1 tablet Oral Daily  . thiamine  100 mg Oral Daily   Or  . thiamine  100 mg Intravenous Daily     LOS: 6 days   Cherene Altes, MD [facility] Office  [phone] Pager - Text Page per Amion  If 7PM-7AM, please contact night-coverage per Amion 12/13/2019, 4:06 PM

## 2019-12-13 NOTE — TOC Progression Note (Addendum)
Transition of Care [facility]) - Progression Note    Patient Details  Name: Jennifer Rocha MRN: [id number] Date of Birth: [birth year]  Transition [facility] CM/SW [place], [place] Phone Number: 12/13/2019, 8:15 AM  Clinical Narrative:    CSW contacted patient's son Jennifer Rocha to follow-up on disposition plan. Jennifer Rocha expressed the family is interested in moving forward with SNF placement. Jennifer Rocha stated the preference [facility] and requested patient's daughter Jennifer Rocha be the main point of contact. CSW was unable to reach [facility] or leave a voicemail.     Expected Discharge Plan: [place] Barriers to Discharge: Continued Medical Work up  Expected Discharge Plan and Services Expected Discharge Plan: [place] Acute Care Choice: NA Living arrangements for the past 2 months: Single Family Home                                       Social Determinants of Health (SDOH) Interventions    Readmission Risk Interventions No flowsheet data found.

## 2019-12-13 NOTE — NC FL2 (Signed)
[place] LEVEL OF CARE SCREENING TOOL     IDENTIFICATION  Patient Name: Jennifer Rocha Birthdate: 01-09-1928 Sex: female Admission Date (Current Location): 12/07/2019  [facility] and [place] Number:  Herbalist and Address:  [facility], [place] [address], [place] [address]      Provider Number: [id number]  Attending Physician Name and Address:  Cherene Altes, MD  Relative Name and Phone Number:  Averyana Pillars    Current Level of Care: Hospital Recommended Level of Care: [place] Prior Approval Number:    Date Approved/Denied:   PASRR Number: [id number]  Discharge Plan: SNF    Current Diagnoses: Patient Active Problem List   Diagnosis Date Noted  . Goals of care, counseling/discussion   . Cerebral embolism with cerebral infarction 12/10/2019  . Anemia   . Closed fracture of neck of right humerus   . Acute pain of right shoulder   . DNR (do not resuscitate)   . Palliative care by specialist   . Traumatic rhabdomyolysis ([place]) 12/07/2019  . Fracture of humeral head, closed, right, initial encounter 12/07/2019  . Fall at home, initial encounter 12/07/2019  . Alcohol dependence syndrome ([place]) 12/07/2019  . Benign essential tremor 01/27/2015  . Chronic combined systolic (congestive) and diastolic (congestive) heart failure ([place]) 08/23/2013  . Ethanol causing toxic effect ([place])   . Non-ischemic cardiomyopathy ([place])   . Hypertension     Orientation RESPIRATION BLADDER Height & Weight     Self  Normal External catheter, Incontinent Weight: 143 lb 4.8 oz (65 kg) Height:  5\' 3"  (160 cm)  BEHAVIORAL SYMPTOMS/MOOD NEUROLOGICAL BOWEL NUTRITION STATUS      Continent Diet (See discharge summary)  AMBULATORY STATUS COMMUNICATION OF NEEDS Skin   Extensive Assist Verbally Normal                       Personal Care Assistance Level of Assistance  Bathing, Feeding, Dressing Bathing Assistance:  Maximum assistance Feeding assistance: Limited assistance Dressing Assistance: Maximum assistance     Functional Limitations Info  Sight, Hearing, Speech Sight Info: Adequate Hearing Info: Adequate Speech Info: Adequate    SPECIAL CARE FACTORS FREQUENCY  PT (By licensed PT), OT (By licensed OT)     PT Frequency: 5x a week OT Frequency: 5x a week            Contractures Contractures Info: Not present    Additional Factors Info  Code Status, Allergies Code Status Info: DNR Allergies Info: Demerol (meperidine); Novocain (procaine)           Current Medications (12/13/2019):  This is the current hospital active medication list Current Facility-Administered Medications  Medication Dose Route Frequency Provider Last Rate Last Admin  . acetaminophen (TYLENOL) tablet 650 mg  650 mg Oral Q6H Knox Royalty, NP   650 mg at 12/13/19 0158  . aspirin EC tablet 81 mg  81 mg Oral Daily Rosalin Hawking, MD   81 mg at 12/12/19 0934  . atorvastatin (LIPITOR) tablet 40 mg  40 mg Oral Daily Karmen Bongo, MD   40 mg at 12/12/19 0934  . bisacodyl (DULCOLAX) EC tablet 5 mg  5 mg Oral Daily PRN Karmen Bongo, MD      . carvedilol (COREG) tablet 6.25 mg  6.25 mg Oral BID Cherene Altes, MD   6.25 mg at 12/12/19 2104  . cloNIDine (CATAPRES - Dosed in mg/24 hr) patch 0.1 mg  0.1 mg Transdermal Weekly Cherene Altes, MD   0.1 mg at 12/11/19 1729  . docusate sodium (COLACE) capsule 100 mg  100 mg Oral BID Karmen Bongo, MD   100 mg at 12/12/19 2104  . DULoxetine (CYMBALTA) DR capsule 30 mg  30 mg Oral Daily Karmen Bongo, MD   30 mg at 12/12/19 0933  . folic acid (FOLVITE) tablet 1 mg  1 mg Oral Daily Karmen Bongo, MD   1 mg at 12/12/19 0934  . LORazepam (ATIVAN) injection 0-4 mg  0-4 mg Intravenous Q8H Cherene Altes, MD   1 mg at 12/13/19 0631  . LORazepam (ATIVAN) tablet 1-4 mg  1-4 mg Oral Q1H PRN Cherene Altes, MD       Or  . LORazepam (ATIVAN) injection 1-4 mg  1-4 mg  Intravenous Q1H PRN Cherene Altes, MD   2 mg at 12/12/19 1806  . MEDLINE mouth rinse  15 mL Mouth Rinse BID Bonnielee Haff, MD   15 mL at 12/12/19 2105  . methocarbamol (ROBAXIN) 500 mg in dextrose 5 % 50 mL IVPB  500 mg Intravenous Q6H PRN Karmen Bongo, MD      . morphine 2 MG/ML injection 2 mg  2 mg Intravenous Q2H PRN Karmen Bongo, MD   2 mg at 12/09/19 2200  . multivitamin with minerals tablet 1 tablet  1 tablet Oral Daily Karmen Bongo, MD   1 tablet at 12/12/19 0935  . ondansetron (ZOFRAN) tablet 4 mg  4 mg Oral Q6H PRN Karmen Bongo, MD       Or  . ondansetron [facility]) injection 4 mg  4 mg Intravenous Q6H PRN Karmen Bongo, MD      . polyethylene glycol (MIRALAX / GLYCOLAX) packet 17 g  17 g Oral Daily PRN Karmen Bongo, MD      . thiamine tablet 100 mg  100 mg Oral Daily Karmen Bongo, MD   100 mg at 12/12/19 0935   Or  . thiamine (B-1) injection 100 mg  100 mg Intravenous Daily Karmen Bongo, MD   100 mg at 12/11/19 1000  . traMADol (ULTRAM) tablet 25 mg  25 mg Oral Q6H PRN Rosezella Rumpf, NP   25 mg at 12/12/19 0901     Discharge Medications: Please see discharge summary for a list of discharge medications.  Relevant Imaging Results:  Relevant Lab Results:   Additional Information ssn [id number]  Neysa Hotter [place], [place]

## 2019-12-14 DIAGNOSIS — R531 Weakness: Secondary | ICD-10-CM

## 2019-12-14 DIAGNOSIS — R627 Adult failure to thrive: Secondary | ICD-10-CM

## 2019-12-14 LAB — CBC
HCT: 33.1 % — ABNORMAL LOW (ref 36.0–46.0)
Hemoglobin: 10.6 g/dL — ABNORMAL LOW (ref 12.0–15.0)
MCH: 32.1 pg (ref 26.0–34.0)
MCHC: 32 g/dL (ref 30.0–36.0)
MCV: 100.3 fL — ABNORMAL HIGH (ref 80.0–100.0)
Platelets: 312 10*3/uL (ref 150–400)
RBC: 3.3 MIL/uL — ABNORMAL LOW (ref 3.87–5.11)
RDW: 16.3 % — ABNORMAL HIGH (ref 11.5–15.5)
WBC: 11 10*3/uL — ABNORMAL HIGH (ref 4.0–10.5)
nRBC: 0 % (ref 0.0–0.2)

## 2019-12-14 LAB — COMPREHENSIVE METABOLIC PANEL
ALT: 24 U/L (ref 0–44)
AST: 27 U/L (ref 15–41)
Albumin: 3 g/dL — ABNORMAL LOW (ref 3.5–5.0)
Alkaline Phosphatase: 66 U/L (ref 38–126)
Anion gap: 9 (ref 5–15)
BUN: 7 mg/dL — ABNORMAL LOW (ref 8–23)
CO2: 27 mmol/L (ref 22–32)
Calcium: 9.2 mg/dL (ref 8.9–10.3)
Chloride: 102 mmol/L (ref 98–111)
Creatinine, Ser: 0.38 mg/dL — ABNORMAL LOW (ref 0.44–1.00)
GFR calc Af Amer: >60 mL/min (ref >60)
GFR calc non Af Amer: >60 mL/min (ref >60)
Glucose, Bld: 126 mg/dL — ABNORMAL HIGH (ref 70–99)
Potassium: 3.9 mmol/L (ref 3.5–5.1)
Sodium: 138 mmol/L (ref 135–145)
Total Bilirubin: 2.1 mg/dL — ABNORMAL HIGH (ref 0.3–1.2)
Total Protein: 5.6 g/dL — ABNORMAL LOW (ref 6.5–8.1)

## 2019-12-14 MED ORDER — VITAMIN D3 1.25 MG (50000 UT) PO CAPS: 50000.0000 [IU] | ORAL_CAPSULE | ORAL | Status: DC

## 2019-12-14 MED ORDER — VITAMIN D (ERGOCALCIFEROL) 1.25 MG (50000 UNIT) PO CAPS: 50000.0000 [IU] | ORAL_CAPSULE | ORAL | Status: DC

## 2019-12-14 NOTE — TOC Progression Note (Signed)
Transition of Care (TOC) - Progression Note    Patient Details  Name: Jennifer Rocha MRN: [id number] Date of Birth: 03/29/1928  Transition of Care (TOC) CM/SW Contact   M , LCSW Phone Number: 12/14/2019, 11:12 AM  Clinical Narrative:   [facility] has declined to offer a bed for the patient. CSW met with patient's son at bedside to provide bed offers. Family to review options available and will get back to CSW with choice. CSW to follow.    Expected Discharge Plan: Skilled Nursing Facility Barriers to Discharge: Continued Medical Work up  Expected Discharge Plan and Services Expected Discharge Plan: Skilled Nursing Facility     Post Acute Care Choice: NA Living arrangements for the past 2 months: Single Family Home                                       Social Determinants of Health (SDOH) Interventions    Readmission Risk Interventions No flowsheet data found.  

## 2019-12-14 NOTE — Evaluation (Signed)
Speech Language Pathology Evaluation Patient Details Name: Jennifer Rocha MRN: [id number] DOB: 1928/02/28 Today's Date: 12/14/2019 Time: 1135-1205 SLP Time Calculation (min) (ACUTE ONLY): 30 min  Problem List:  Patient Active Problem List   Diagnosis Date Noted  . Goals of care, counseling/discussion   . Cerebral embolism with cerebral infarction 12/10/2019  . Anemia   . Closed fracture of neck of right humerus   . Acute pain of right shoulder   . DNR (do not resuscitate)   . Palliative care by specialist   . Traumatic rhabdomyolysis ([place]) 12/07/2019  . Fracture of humeral head, closed, right, initial encounter 12/07/2019  . Fall at home, initial encounter 12/07/2019  . Alcohol dependence syndrome ([place]) 12/07/2019  . Benign essential tremor 01/27/2015  . Chronic combined systolic (congestive) and diastolic (congestive) heart failure ([place]) 08/23/2013  . Ethanol causing toxic effect ([place])   . Non-ischemic cardiomyopathy ([place])   . Hypertension    Past Medical History:  Past Medical History:  Diagnosis Date  . Alcohol dependence ([place])   . HLD (hyperlipidemia)   . Hypertension   . Left heart failure ([place])   . Non-ischemic cardiomyopathy ([place])    with EF 20% improving to 50%   Past Surgical History:  Past Surgical History:  Procedure Laterality Date  . ANKLE SURGERY Right   . APPENDECTOMY  1941  . TONSILLECTOMY     HPI:  Pt is a 86 yo female presenting s/p fall with prolonged downtime. Admitted with rhabdomyolysis and humeral head fx. Due to persistent confusion and speech difficulty MRI of the the brain was accomplished 7/15 and revealed an extensive left MCA CVA as well as a small CVA in the right frontal lobe with a subarachnoid hemorrhage - felt to likely be cardioembolic .Per chart, family reported other recent falls and concern for increased confusion and memory loss. Family also reports a h/o coughing with solids PTA, although never evaluated. CXR and CT Chest both show  clear lungs. PMH includes: CHF, HTN, HLD, ETOH dependence   Assessment / Plan / Recommendation Clinical Impression  Pt presents with significant receptive and expressive language deficits - receptively, pt has difficulty answering yes/no questions. She exhibits almost constant head tremors, and is unable to verbalize "yes" and "no" consistently. She follows most 1-step commands, and some 2-step commands, however, apraxia is evident and may exacerbate pt's ability to follow commands effectively. Expressively, pt occasionally verbalizes individual words or short phrases ("where", "why", "how is Bree" (her dog)), but is unable to demonstrate vowels or automatic sequences. She is unable to repeat sounds or words. Pt appears aware of her difficulty, expressing a frustrated facial expression. Continued ST intervention is recommended to maximize effective communication and minimize caregiver burden. Reading comprehension was not assessed today - recommend evaluation next session. Pt may benefit from a communication board to facilitate communication of wants and needs. Continued ST intervention is recommended after DC from acute care.    SLP Assessment  SLP Recommendation/Assessment: Patient needs continued Speech Language Pathology Services SLP Visit Diagnosis: Aphasia (R47.01);Apraxia (R48.2)    Follow Up Recommendations  Skilled Nursing facility;24 hour supervision/assistance    Frequency and Duration min 2x/week  2 weeks      SLP Evaluation Cognition  Overall Cognitive Status: Difficult to assess (significant aphasia and apraxia) Arousal/Alertness: Awake/alert       Comprehension  Auditory Comprehension Overall Auditory Comprehension: Impaired Yes/No Questions: Impaired Basic Biographical Questions: 26-50% accurate Basic Immediate Environment Questions: 25-49% accurate Commands: Impaired One Step Basic  Commands: 50-74% accurate Two Step Basic Commands: 25-49% accurate Conversation:  Simple Visual Recognition/Discrimination Discrimination: Not tested Reading Comprehension Reading Status: Not tested    Expression Expression Primary Mode of Expression: Nonverbal - gestures Verbal Expression Overall Verbal Expression: Impaired Initiation: No impairment Automatic Speech:  (unable to demonstrate) Level of Generative/Spontaneous Verbalization: Word Repetition: Impaired Level of Impairment: Word level Naming: Impairment Responsive: 0-25% accurate Confrontation: Impaired Convergent: 0-24% accurate Divergent: 0-24% accurate Other Naming Comments: pt will occasionally verbalize "no", "where", "why", "how is..." Non-Verbal Means of Communication: Gestures Written Expression Dominant Hand: Right Written Expression: Not tested   Oral / Motor  Oral Motor/Sensory Function Overall Oral Motor/Sensory Function: Generalized oral weakness Motor Speech Overall Motor Speech: Impaired Respiration: Within functional limits Phonation: Normal Resonance: Within functional limits Articulation: Within functional limitis Intelligibility: Intelligibility reduced Word: 50-74% accurate Motor Planning: Impaired Level of Impairment: Word Motor Speech Errors: Aware   GO                   Nemesis Rainwater B. Quentin Ore, [facility], [place] Speech Language Pathologist Office: [phone] Pager: [phone]  [name] 12/14/2019, 12:29 PM

## 2019-12-14 NOTE — Plan of Care (Signed)
  Problem: Education: Goal: Knowledge of General Education information will improve Description Including pain rating scale, medication(s)/side effects and non-pharmacologic comfort measures Outcome: Progressing   Problem: Health Behavior/Discharge Planning: Goal: Ability to manage health-related needs will improve Outcome: Progressing   

## 2019-12-14 NOTE — Progress Notes (Signed)
Occupational Therapy Treatment Patient Details Name: Jennifer Rocha MRN: [id number] DOB: Apr 07, 1928 Today's Date: 12/14/2019    History of present illness Pt is a 86 y.o. F with significant PMH of CHF, HTN, who presents after a fall and extended time down with rhabdomyolysis, right humerus fracture and acute metabolic encephalopathy.   OT comments  Pt making good progress towards OT goals this session. Pt continues to present with impaired balance, decreased activity tolerance, impaired functional use of RUE and cognitive deficits impacting pts ability to complete BADLs. Pt was able to mobilize OOB to recliner this session with HHA and MINA +2 for safety. Pt stating simple words throughout session, but otherwise pts speech was unintelligible . Pt would continue to benefit from skilled occupational therapy while admitted and after d/c to address the below listed limitations in order to improve overall functional mobility and facilitate independence with BADL participation. DC plan remains appropriate, will follow acutely per POC.     Follow Up Recommendations  SNF    Equipment Recommendations  Other (comment) (defer)    Recommendations for Other Services      Precautions / Restrictions Precautions Precautions: Fall Required Braces or Orthoses: Sling Restrictions Weight Bearing Restrictions: Yes RUE Weight Bearing: Non weight bearing       Mobility Bed Mobility Overal bed mobility: Needs Assistance Bed Mobility: Supine to Sit     Supine to sit: Min guard;HOB elevated     General bed mobility comments: min guard for safety with HOB elevated, cues to not use RUE during bed mobility  Transfers Overall transfer level: Needs assistance Equipment used: 2 person hand held assist Transfers: Sit to/from Stand Sit to Stand: Min assist;+2 physical assistance         General transfer comment: MIN A +2 to power up from EOB, cues for WB'ing restricitons    Balance Overall balance  assessment: Needs assistance Sitting-balance support: Feet supported;No upper extremity supported Sitting balance-Leahy Scale: Fair     Standing balance support: Single extremity supported;During functional activity Standing balance-Leahy Scale: Poor Standing balance comment: reliant on external support                           ADL either performed or assessed with clinical judgement   ADL Overall ADL's : Needs assistance/impaired                 Upper Body Dressing : Total assistance;Sitting Upper Body Dressing Details (indicate cue type and reason): to don sling from EOB Lower Body Dressing: Total assistance;Sit to/from stand Lower Body Dressing Details (indicate cue type and reason): pt reaching down for sock to adjust but required total A d/t limitations with RUE Toilet Transfer: Minimal assistance;+2 for safety/equipment;Ambulation Toilet Transfer Details (indicate cue type and reason): simulated via functional mobility, MIN HHA +2 for safety to ambulate from EOB>recliner         Functional mobility during ADLs: Minimal assistance;+2 for safety/equipment General ADL Comments: pt able to ambulate from EOB>recliner this session     Vision       Perception     Praxis      Cognition Arousal/Alertness: Awake/alert Behavior During Therapy: Flat affect Overall Cognitive Status: Impaired/Different from baseline Area of Impairment: Following commands;Orientation;Attention;Safety/judgement;Awareness;Problem solving;Memory                 Orientation Level: Disoriented to;Person;Place;Time (when cued is your name "sarah pt nods as well as "are you at home") Current  Attention Level: Focused Memory: Decreased recall of precautions Following Commands: Follows one step commands consistently Safety/Judgement: Decreased awareness of safety;Decreased awareness of deficits Awareness: Intellectual Problem Solving: Decreased initiation;Slow  processing;Difficulty sequencing;Requires verbal cues;Requires tactile cues General Comments: pt able to state "yeah" and "lets try" but other than that pts speech is mostly unintelligle. pt required multimodal cues to recall NWB on RUE        Exercises     Shoulder Instructions       General Comments pts son present at end of session. sling off upon arrival. donned sling and applied ice once pt in chair    Pertinent Vitals/ Pain       Pain Assessment: Faces Faces Pain Scale: No hurt  Home Living                                          Prior Functioning/Environment              Frequency  Min 2X/week        Progress Toward Goals  OT Goals(current goals can now be found in the care plan section)  Progress towards OT goals: Progressing toward goals  Acute Rehab OT Goals Patient Stated Goal: None stated. Pt lethargic due to Ativan given earlier this morning OT Goal Formulation: With patient/family Time For Goal Achievement: 12/24/19 Potential to Achieve Goals: Good  Plan Discharge plan remains appropriate;Frequency remains appropriate    Co-evaluation                 AM-PAC OT "6 Clicks" Daily Activity     Outcome Measure   Help from another person eating meals?: A Lot Help from another person taking care of personal grooming?: A Lot   Help from another person bathing (including washing, rinsing, drying)?: A Lot Help from another person to put on and taking off regular upper body clothing?: A Lot Help from another person to put on and taking off regular lower body clothing?: A Lot 6 Click Score: 10    End of Session Equipment Utilized During Treatment: Gait belt;Other (comment) (sling)  OT Visit Diagnosis: Unsteadiness on feet (R26.81);Muscle weakness (generalized) (M62.81)   Activity Tolerance Patient tolerated treatment well   Patient Left in chair;with call bell/phone within reach;with chair alarm set;with family/visitor  present   Nurse Communication Mobility status;Other (comment) (let pt sit in chair ~1 hour)        Time: 7290-2111 OT Time Calculation (min): 24 min  Charges: OT General Charges $OT Visit: 1 Visit OT Treatments $Self Care/Home Management : 23-37 mins  Lanier Clam., COTA/L Acute Rehabilitation Services [phone]    Ihor Gully 12/14/2019, 9:38 AM

## 2019-12-14 NOTE — Progress Notes (Addendum)
Jennifer Rocha  IWP:[id number] DOB: [birth year] DOA: 12/07/2019 PCP: Reynold Bowen, MD    Brief Narrative:  86 year old with a history of chronic combined CHF, HTN, and HLD who presented to the ED 12/07/19 after suffering a fall and being found down after prolonged period time. The exact events leading to her fall were not clear. The patient called her daughter reporting that she was unable to get up and was found to be confused. On further history family did report progressively worsening confusion over a month or more.  Full evaluation in the ED revealed a humeral neck fracture as well as mild rhabdomyolysis.  Orthopedics evaluated the patient and recommended a splint with outpatient follow-up. She was admitted to the acute units for evaluation.   Antimicrobials:  None  Subjective: The patient is seen during her SLP evaluation.  She is much more calm and is alert today.  She appears to be attempting to speak to me but perseverates on the word "where".  She will not follow simple commands for me is difficult to determine at this time if she is oriented.  She does not appear to be in any pain.  She is not agitated at this time.  Assessment & Plan:  Acute ischemic strokes - extensive L MCA distribution + R frontal lobe  Due to persistent confusion and speech difficulty MRI of the brain was accomplished 7/15 and revealed an extensive left MCA CVA as well as a small CVA in the right frontal lobe with a subarachnoid hemorrhage - felt to likely be cardioembolic - patient not currently an anticoagulation candidate therefore no further cardioembolic work-up indicated at present -follow-up in clinic with Dr. Leonie Man in 4 weeks to determine if embolic work-up can be completed at that time based upon candidacy for anticoagulation - PT/OT continue - patient will require SNF placement for ongoing care  Subarachnoid hemorrhage and intraventricular hemorrhage Not originally commented on at time of CT head 7/13  but noted on MRI 7/15 and retrospectively felt to have been present at time of 7/13 CT - Stroke Team feels this is due to hemorrhagic conversion from her CVA -aspirin has been initiated as per stroke team recommendation  Acute alcohol withdrawal - Alcohol dependence Has completed treatment with the Ativan CIWA protocol -much more calm today -continue to monitor -continue thiamine supplementation  Hypokalemia Supplement as needed to goal of 4.0 - magnesium acceptable at 1.9   Altered mental status /acute metabolic encephalopathy Due to large CVA plus alcohol withdrawal in setting of baseline probable developing dementia -alcohol withdrawal has now stabilized -patient now probably at her new baseline  Traumatic rhabdomyolysis Due to fall and prolonged downtime -CK peaked at 2943 -has consistently trended downward with IV resuscitation and is confirmed to be normal in f/u  Right humerus fracture Evaluated by Trauma Ortho at time of presentation -plan is for sling and outpatient monitoring -hopeful to avoid surgery -patient is frequently refusing to wear sling and this may interfere with her healing  Traumatic fall Patient underwent multiple radiological studies including pelvic x-ray, x-ray of the left foot, right shoulder, CT scan of the head, CT scan of the cervical spine, CT scan of the maxillofacial area, CT scan of the chest abdomen and pelvis.  Apart from the injury to her humerus no other injuries were noted.  Macrocytic anemia -anemia of acute blood loss Significant blood loss felt to be related to bleeding into forearm and chest related to her humeral fracture - status post 1  unit PRBC 7/15 with good response -hemoglobin stable  Essential hypertension goal to keep systolic below 841 in setting of subarachnoid/IVH -clonidine added during this admission and increased 7/19  Chronic combined systolic and diastolic CHF No old TTE in our system -stress test records from 2017 suggest EF  45-55% -clinically compensated -TTE this admit notes EF 55-60% and indeterminate diastolic parameters -no evidence of volume overload at present  HLD On chronic Lipitor  Goals of care Given advanced age and history suggestive of progressive dementia Palliative Care has been consulted to consider goals of care   DVT prophylaxis: SCDs Code Status: DNR Family Communication: Spoke with son at bedside -placed call to daughter but did not get an answer Status is: Inpatient  Remains inpatient appropriate because:Inpatient level of care appropriate due to severity of illness   Dispo:  Patient From: Home  Planned Disposition: To be determined  Expected discharge date: 12/13/19  Medically stable for discharge: No   Consultants:  Neurology Trauma Ortho Palliative Care  Objective: Blood pressure (!) 144/86, pulse 99, temperature 97.9 F (36.6 C), temperature source Oral, resp. rate 20, height 5\' 3"  (1.6 m), weight 65 kg, SpO2 99 %.  Intake/Output Summary (Last 24 hours) at 12/14/2019 1613 Last data filed at 12/14/2019 1300 Gross per 24 hour  Intake 1072 ml  Output 1700 ml  Net -628 ml   Filed Weights   12/07/19 0901  Weight: 65 kg    Examination: General: More alert today but remains altered Lungs: CTA B without wheezing Cardiovascular: RRR without murmur Abdomen: Nondistended, soft, BS positive Extremities: No edema bilateral LE -extensive ecchymosis and hematoma of right shoulder/chest without significant change  CBC: Recent Labs  Lab 12/12/19 0506 12/13/19 1017 12/14/19 0414  WBC 9.3 10.6* 11.0*  HGB 10.0* 10.9* 10.6*  HCT 30.8* 34.0* 33.1*  MCV 100.0 101.2* 100.3*  PLT 303 335 324   Basic Metabolic Panel: Recent Labs  Lab 12/08/19 0512 12/09/19 0241 12/12/19 0506 12/13/19 1017 12/14/19 0414  NA 140   < > 137 137 138  K 3.0*   < > 3.1* 3.4* 3.9  CL 105   < > 99 100 102  CO2 27   < > 29 28 27   GLUCOSE 104*   < > 109* 126* 126*  BUN 20   < > 8 7* 7*    CREATININE 0.49   < > 0.45 0.41* 0.38*  CALCIUM 8.3*   < > 8.8* 9.0 9.2  MG 2.2  --  1.9  --   --   PHOS  --   --  3.7  --   --    < > = values in this interval not displayed.   GFR: Estimated Creatinine Clearance: 42.4 mL/min (A) (by C-G formula based on SCr of 0.38 mg/dL (L)).  Liver Function Tests: Recent Labs  Lab 12/10/19 0409 12/11/19 0725 12/12/19 0506 12/14/19 0414  AST 36 28 26 27   ALT 35 30 26 24   ALKPHOS 55 61 58 66  BILITOT 2.1* 2.7* 2.3* 2.1*  PROT 5.5* 5.4* 5.4* 5.6*  ALBUMIN 3.0* 2.9* 2.9* 3.0*    Cardiac Enzymes: Recent Labs  Lab 12/08/19 0512 12/09/19 0241 12/12/19 0506  CKTOTAL 1,102* 674* 144    HbA1C: Hgb A1c MFr Bld  Date/Time Value Ref Range Status  12/10/2019 04:09 AM 5.1 4.8 - 5.6 % Final    Comment:    (NOTE) Pre diabetes:          5.7%-6.4%  Diabetes:              >  6.4%  Glycemic control for   <7.0% adults with diabetes      Recent Results (from the past 240 hour(s))  SARS Coronavirus 2 by RT PCR (hospital order, performed in [facility] hospital lab) Nasopharyngeal Nasopharyngeal Swab     Status: None   Collection Time: 12/07/19 11:39 AM   Specimen: Nasopharyngeal Swab  Result Value Ref Range Status   SARS Coronavirus 2 NEGATIVE NEGATIVE Final    Comment: (NOTE) SARS-CoV-2 target nucleic acids are NOT DETECTED.  The SARS-CoV-2 RNA is generally detectable in upper and lower respiratory specimens during the acute phase of infection. The lowest concentration of SARS-CoV-2 viral copies this assay can detect is 250 copies / mL. A negative result does not preclude SARS-CoV-2 infection and should not be used as the sole basis for treatment or other patient management decisions.  A negative result may occur with improper specimen collection / handling, submission of specimen other than nasopharyngeal swab, presence of viral mutation(s) within the areas targeted by this assay, and inadequate number of viral copies (<250 copies /  mL). A negative result must be combined with clinical observations, patient history, and epidemiological information.  Fact Sheet for Patients:   StrictlyIdeas.no  Fact Sheet for Healthcare Providers: BankingDealers.co.za  This test is not yet approved or  cleared by the [place] FDA and has been authorized for detection and/or diagnosis of SARS-CoV-2 by FDA under an Emergency Use Authorization (EUA).  This EUA will remain in effect (meaning this test can be used) for the duration of the COVID-19 declaration under Section 564(b)(1) of the Act, 21 U.S.C. section 360bbb-3(b)(1), unless the authorization is terminated or revoked sooner.  Performed at [facility], [place] [address] [place], [place] [address]      Scheduled Meds: . acetaminophen  650 mg Oral Q6H  . aspirin EC  81 mg Oral Daily  . atorvastatin  40 mg Oral Daily  . carvedilol  6.25 mg Oral BID  . cloNIDine  0.2 mg Transdermal Weekly  . docusate sodium  100 mg Oral BID  . DULoxetine  30 mg Oral Daily  . feeding supplement (ENSURE ENLIVE)  237 mL Oral TID BM  . folic acid  1 mg Oral Daily  . mouth rinse  15 mL Mouth Rinse BID  . multivitamin with minerals  1 tablet Oral Daily  . thiamine  100 mg Oral Daily   Or  . thiamine  100 mg Intravenous Daily     LOS: 7 days   Cherene Altes, MD [facility] Office  [phone] Pager - Text Page per Amion  If 7PM-7AM, please contact night-coverage per Amion 12/14/2019, 4:13 PM

## 2019-12-15 DIAGNOSIS — I5032 Chronic diastolic (congestive) heart failure: Secondary | ICD-10-CM | POA: Diagnosis not present

## 2019-12-15 DIAGNOSIS — G9341 Metabolic encephalopathy: Secondary | ICD-10-CM | POA: Diagnosis not present

## 2019-12-15 DIAGNOSIS — T796XXA Traumatic ischemia of muscle, initial encounter: Secondary | ICD-10-CM | POA: Diagnosis not present

## 2019-12-15 DIAGNOSIS — I428 Other cardiomyopathies: Secondary | ICD-10-CM | POA: Diagnosis not present

## 2019-12-15 DIAGNOSIS — I69391 Dysphagia following cerebral infarction: Secondary | ICD-10-CM | POA: Diagnosis not present

## 2019-12-15 DIAGNOSIS — F1020 Alcohol dependence, uncomplicated: Secondary | ICD-10-CM | POA: Diagnosis not present

## 2019-12-15 DIAGNOSIS — R278 Other lack of coordination: Secondary | ICD-10-CM | POA: Diagnosis not present

## 2019-12-15 DIAGNOSIS — I69351 Hemiplegia and hemiparesis following cerebral infarction affecting right dominant side: Secondary | ICD-10-CM | POA: Diagnosis not present

## 2019-12-15 DIAGNOSIS — Z9181 History of falling: Secondary | ICD-10-CM | POA: Diagnosis not present

## 2019-12-15 DIAGNOSIS — I5042 Chronic combined systolic (congestive) and diastolic (congestive) heart failure: Secondary | ICD-10-CM | POA: Diagnosis not present

## 2019-12-15 DIAGNOSIS — Z8781 Personal history of (healed) traumatic fracture: Secondary | ICD-10-CM | POA: Diagnosis not present

## 2019-12-15 DIAGNOSIS — Z7401 Bed confinement status: Secondary | ICD-10-CM | POA: Diagnosis not present

## 2019-12-15 DIAGNOSIS — D649 Anemia, unspecified: Secondary | ICD-10-CM | POA: Diagnosis not present

## 2019-12-15 DIAGNOSIS — R1312 Dysphagia, oropharyngeal phase: Secondary | ICD-10-CM | POA: Diagnosis not present

## 2019-12-15 DIAGNOSIS — M2550 Pain in unspecified joint: Secondary | ICD-10-CM | POA: Diagnosis not present

## 2019-12-15 DIAGNOSIS — Z8673 Personal history of transient ischemic attack (TIA), and cerebral infarction without residual deficits: Secondary | ICD-10-CM | POA: Diagnosis not present

## 2019-12-15 DIAGNOSIS — I10 Essential (primary) hypertension: Secondary | ICD-10-CM | POA: Diagnosis not present

## 2019-12-15 DIAGNOSIS — I609 Nontraumatic subarachnoid hemorrhage, unspecified: Secondary | ICD-10-CM | POA: Diagnosis not present

## 2019-12-15 DIAGNOSIS — M25511 Pain in right shoulder: Secondary | ICD-10-CM | POA: Diagnosis not present

## 2019-12-15 DIAGNOSIS — R4701 Aphasia: Secondary | ICD-10-CM | POA: Diagnosis not present

## 2019-12-15 DIAGNOSIS — I959 Hypotension, unspecified: Secondary | ICD-10-CM | POA: Diagnosis not present

## 2019-12-15 DIAGNOSIS — R41841 Cognitive communication deficit: Secondary | ICD-10-CM | POA: Diagnosis not present

## 2019-12-15 DIAGNOSIS — T796XXD Traumatic ischemia of muscle, subsequent encounter: Secondary | ICD-10-CM | POA: Diagnosis not present

## 2019-12-15 DIAGNOSIS — G459 Transient cerebral ischemic attack, unspecified: Secondary | ICD-10-CM | POA: Diagnosis not present

## 2019-12-15 DIAGNOSIS — I6340 Cerebral infarction due to embolism of unspecified cerebral artery: Secondary | ICD-10-CM | POA: Diagnosis not present

## 2019-12-15 DIAGNOSIS — R2681 Unsteadiness on feet: Secondary | ICD-10-CM | POA: Diagnosis not present

## 2019-12-15 DIAGNOSIS — S42301D Unspecified fracture of shaft of humerus, right arm, subsequent encounter for fracture with routine healing: Secondary | ICD-10-CM | POA: Diagnosis not present

## 2019-12-15 DIAGNOSIS — S42231A 3-part fracture of surgical neck of right humerus, initial encounter for closed fracture: Secondary | ICD-10-CM | POA: Diagnosis not present

## 2019-12-15 DIAGNOSIS — I69320 Aphasia following cerebral infarction: Secondary | ICD-10-CM | POA: Diagnosis not present

## 2019-12-15 DIAGNOSIS — E785 Hyperlipidemia, unspecified: Secondary | ICD-10-CM | POA: Diagnosis not present

## 2019-12-15 DIAGNOSIS — S42201D Unspecified fracture of upper end of right humerus, subsequent encounter for fracture with routine healing: Secondary | ICD-10-CM | POA: Diagnosis not present

## 2019-12-15 DIAGNOSIS — G250 Essential tremor: Secondary | ICD-10-CM | POA: Diagnosis not present

## 2019-12-15 DIAGNOSIS — E876 Hypokalemia: Secondary | ICD-10-CM | POA: Diagnosis not present

## 2019-12-15 DIAGNOSIS — J069 Acute upper respiratory infection, unspecified: Secondary | ICD-10-CM | POA: Diagnosis not present

## 2019-12-15 LAB — SARS CORONAVIRUS 2 BY RT PCR (HOSPITAL ORDER, PERFORMED IN ~~LOC~~ HOSPITAL LAB): SARS Coronavirus 2: NEGATIVE

## 2019-12-15 MED ORDER — CLONIDINE 0.2 MG/24HR TD PTWK: 0.2000 mg | MEDICATED_PATCH | TRANSDERMAL | Status: AC

## 2019-12-15 MED ORDER — FOLIC ACID 1 MG PO TABS: 1.0000 mg | ORAL_TABLET | Freq: Every day | ORAL | Status: AC

## 2019-12-15 MED ORDER — THIAMINE HCL 100 MG PO TABS: 100.0000 mg | ORAL_TABLET | Freq: Every day | ORAL | Status: AC

## 2019-12-15 MED ORDER — TRAMADOL HCL 50 MG PO TABS: 25.0000 mg | ORAL_TABLET | Freq: Four times a day (QID) | ORAL | 0 refills | Status: DC | PRN

## 2019-12-15 MED ORDER — ENSURE ENLIVE PO LIQD: 237.0000 mL | Freq: Three times a day (TID) | ORAL | Status: DC

## 2019-12-15 NOTE — Progress Notes (Signed)
Patient being discharged to [facility].  Patient being transported by [facility].  Discharge instructions and prescription information placed in the packet for discharge.  IV removed with the catheter intact.  Report given to [facility] at [facility].

## 2019-12-15 NOTE — Progress Notes (Signed)
Manufacturing engineer [facility])   Referral received for outpatient palliative services at [facility].  ACC will f/u with facility and family.  Venia Carbon RN, BSN, [facility] Liaison

## 2019-12-15 NOTE — Progress Notes (Signed)
SLP Cancellation Note  Patient Details Name: Jennifer Rocha MRN: [id number] DOB: 29-Jun-1927   Cancelled treatment:       Reason Eval/Treat Not Completed: Patient unavailable - currently with nursing for pt care. Will continue efforts.  Dimond Crotty B. Quentin Ore, [facility], [place] Speech Language Pathologist Office: [phone] Pager: [phone]  [name] 12/15/2019, 9:25 AM

## 2019-12-15 NOTE — Plan of Care (Signed)
?  Problem: Education: ?Goal: Knowledge of General Education information will improve ?Description: Including pain rating scale, medication(s)/side effects and non-pharmacologic comfort measures ?Outcome: Progressing ?  ?Problem: Safety: ?Goal: Ability to remain free from injury will improve ?Outcome: Progressing ?  ?Problem: Pain Managment: ?Goal: General experience of comfort will improve ?Outcome: Progressing ?  ?

## 2019-12-15 NOTE — TOC Transition Note (Signed)
Transition of Care [facility]) - CM/SW Discharge Note   Patient Details  Name: Jennifer Rocha MRN: [id number] Date of Birth: Jun 18, 1927  Transition of Care [facility] CM/SW Contact:  Geralynn Ochs, LCSW Phone Number: 12/15/2019, 3:39 PM   Clinical Narrative:   Nurse to call report to [phone], Room 103.    Final next level of care: Skilled Nursing Facility Barriers to Discharge: Barriers Resolved   Patient Goals and CMS Choice Patient states their goals for this hospitalization and ongoing recovery are:: patient unable to participate in goal setting due to disorientation CMS Medicare.gov Compare Post Acute Care list provided to:: Patient Represenative (must comment) Choice offered to / list presented to : Adult Children  Discharge Placement              Patient [facility] bed at: [facility] Patient to be transferred to facility by: [place] Name of family member notified: Tanna Furry Patient and family notified of of transfer: 12/15/19  Discharge Plan and Services     Post Acute Care Choice: NA                               Social Determinants of Health (SDOH) Interventions     Readmission Risk Interventions No flowsheet data found.

## 2019-12-15 NOTE — Plan of Care (Signed)
  Problem: Nutrition: Goal: Adequate nutrition will be maintained 12/15/2019 1535 by Caroll Rancher, RN Outcome: Adequate for Discharge 12/15/2019 0829 by Caroll Rancher, RN Outcome: Progressing   Problem: Pain Managment: Goal: General experience of comfort will improve 12/15/2019 1535 by Caroll Rancher, RN Outcome: Adequate for Discharge 12/15/2019 0829 by Caroll Rancher, RN Outcome: Progressing   Problem: Education: Goal: Knowledge of disease or condition will improve 12/15/2019 1535 by Caroll Rancher, RN Outcome: Adequate for Discharge 12/15/2019 0829 by Caroll Rancher, RN Outcome: Progressing

## 2019-12-15 NOTE — Progress Notes (Signed)
Physical Therapy Treatment Patient Details Name: Jennifer Rocha MRN: [id number] DOB: [birth year] Today's Date: 12/15/2019    History of Present Illness Pt is a 86 y.o. F with significant PMH of CHF, HTN, who presents after a fall and extended time down with rhabdomyolysis, right humerus fracture and acute metabolic encephalopathy.    PT Comments    Pt progressing towards physical therapy goals. She was able to improve gait distance this session and ambulated ~40 feet out to the hall and back to the recliner. Distance limited by reported dizziness and bowel incontinence. Due to expressive aphasia, feel this patient could benefit from a toileting schedule, however this episode of incontinence did happen even after staff had addressed toileting within the hour.  Of note, this therapist received a sticky note on the door stating the "patient needs a walker with an arm rest", dated 12/14/19. Also noted the rolling walker was open and out in the room. This patient continues to be NWB in her RUE, and therefore cannot use a platform walker as she would still be putting weight through the shoulder. It is also not safe to attempt using a walker with only one UE. Until her weightbearing status is updated, the safest way to assist this patient is HHA on the L and use of a gait belt. If the staff is not comfortable with this, the Stedy and gait belt is also an option.    Follow Up Recommendations  SNF;Supervision/Assistance - 24 hour     Equipment Recommendations  Other (comment) (defer to next venue)    Recommendations for Other Services       Precautions / Restrictions Precautions Precautions: Fall Required Braces or Orthoses: Sling Restrictions Weight Bearing Restrictions: Yes RUE Weight Bearing: Non weight bearing    Mobility  Bed Mobility               General bed mobility comments: Pt received sitting up in the recliner  Transfers Overall transfer level: Needs  assistance Equipment used: 2 person hand held assist Transfers: Sit to/from Stand Sit to Stand: Min assist;+2 physical assistance         General transfer comment: VC's to let RUE rest in the sling as she is trying to use it to push herself up. Pt required +2 min assist for balance support and safety to achieve full stand.   Ambulation/Gait Ambulation/Gait assistance: Min assist Gait Distance (Feet): 40 Feet Assistive device: 2 person hand held assist Gait Pattern/deviations: Step-through pattern;Decreased stride length;Trunk flexed;Shuffle Gait velocity: decreased Gait velocity interpretation: <1.31 ft/sec, indicative of household ambulator General Gait Details: Pt was able to ambulate out of the room, across the hall to the wall, and back to the recliner. 2 seated rests taken - once at the sink as she got dizzy, and then when she stood up again, had bowel incontinence and required another seated rest EOB while we cleaned her up.    Stairs             Wheelchair Mobility    Modified Rankin (Stroke Patients Only)       Balance Overall balance assessment: Needs assistance Sitting-balance support: Feet supported;No upper extremity supported Sitting balance-Leahy Scale: Fair     Standing balance support: Single extremity supported;During functional activity Standing balance-Leahy Scale: Poor Standing balance comment: reliant on external support                            Cognition Arousal/Alertness:  Awake/alert Behavior During Therapy: Flat affect Overall Cognitive Status: Difficult to assess Area of Impairment: Following commands;Attention;Safety/judgement;Awareness;Problem solving;Memory                   Current Attention Level: Focused Memory: Decreased recall of precautions Following Commands: Follows one step commands consistently Safety/Judgement: Decreased awareness of safety;Decreased awareness of deficits Awareness:  Intellectual Problem Solving: Decreased initiation;Slow processing;Difficulty sequencing;Requires verbal cues;Requires tactile cues General Comments: pt able to state "yeah" and "lets try" but other than that pts speech is mostly unintelligle. pt required multimodal cues to recall NWB on RUE      Exercises      General Comments        Pertinent Vitals/Pain Pain Assessment: Faces Faces Pain Scale: No hurt Pain Location: Pt nods head yes when asked about pain but does not indicate her shoulder, does not rate pain on 0-10 scale, and does not grimace or appear to be in pain during session.  Pain Intervention(s): Monitored during session;Repositioned    Home Living                      Prior Function            PT Goals (current goals can now be found in the care plan section) Acute Rehab PT Goals Patient Stated Goal: None stated PT Goal Formulation: With family Time For Goal Achievement: 12/22/19 Potential to Achieve Goals: Fair Progress towards PT goals: Progressing toward goals    Frequency    Min 3X/week      PT Plan Current plan remains appropriate    Co-evaluation              AM-PAC PT "6 Clicks" Mobility   Outcome Measure  Help needed turning from your back to your side while in a flat bed without using bedrails?: A Lot Help needed moving from lying on your back to sitting on the side of a flat bed without using bedrails?: Total Help needed moving to and from a bed to a chair (including a wheelchair)?: Total Help needed standing up from a chair using your arms (e.g., wheelchair or bedside chair)?: Total Help needed to walk in hospital room?: Total Help needed climbing 3-5 steps with a railing? : Total 6 Click Score: 7    End of Session Equipment Utilized During Treatment: Gait belt;Other (comment) (RUE sling) Activity Tolerance: Patient tolerated treatment well Patient left: in chair;with call bell/phone within reach;with chair alarm  set;with family/visitor present Nurse Communication: Mobility status PT Visit Diagnosis: Pain;Difficulty in walking, not elsewhere classified (R26.2);Unsteadiness on feet (R26.81) Pain - Right/Left: Right Pain - part of body: Shoulder     Time: 0071-2197 PT Time Calculation (min) (ACUTE ONLY): 35 min  Charges:  $Gait Training: 23-37 mins                     Rolinda Roan, PT, DPT Acute Rehabilitation Services Pager: [phone] Office: [phone]    Thelma Comp 12/15/2019, 2:24 PM

## 2019-12-15 NOTE — Discharge Summary (Addendum)
Physician Discharge Summary  Jennifer Rocha NFA:[id number] DOB: 1928-01-08 DOA: 12/07/2019  PCP: Reynold Bowen, MD  Admit date: 12/07/2019 Discharge date: 12/15/2019  Time spent: 45 minutes  Recommendations for Outpatient Follow-up:  Patient will be discharged to skilled nursing facility, continue speech, physical, and occupational therapy. Palliative care to follow.  Patient will need to follow up with primary care provider within one week of discharge, repeat CBC and BMP.  Follow-up with neurology.  Follow-up with orthopedics.  Patient should continue medications as prescribed.  Patient should follow a dysphagia 2 diet.   Discharge Diagnoses:  Acute ischemic strokes Subarachnoid hemorrhage and intraventricular hemorrhage Acute alcohol withdrawal - Alcohol dependence Hypokalemia Acute metabolic encephalopathy Traumatic rhabdomyolysis Right humerus fracture Traumatic fall Macrocytic anemia/ Anemia of acute blood loss Essential hypertension Chronic combined systolic and diastolic CHF Hyperlipidemia Goals of care  Discharge Condition: Stable  Diet recommendation: Dysphagia 2  Filed Weights   12/07/19 0901  Weight: 65 kg    History of present illness:  On 12/07/2019 by Dr. Karmen Bongo Jennifer Rocha is a 86 y.o. female with medical history significant of chronic combined CHF; HTN; and HLD presenting with a fall on Saturday, down since.  Family is not sure what happened.  The patient called her daughter this AM and reported that she was unable to get up and started talking about trees and other things that didn't make sense.  Her son called Sunday and she didn't return the call.  Her daughter talked to her Saturday morning.  Her neighbors saw her go to church Sunday but she didn't pick up the Monday or Tuesday paper.  They think maybe she fell Sunday night.  She does not remember what happened.  Family is noticing the start of memory loss.  She fell last week.  When her daughter  visited last week, she was having trouble remembering her son's wife's name.  She is repeating herself occasionally.  She does still drive, hasn't gotten lost so far as her daughter knows.  She leaves the garage door open occasionally.  Hospital Course:  Acute ischemic strokes -Extensive L MCA distribution + R frontal lobe  -Due to persistent confusion and speech difficulty, MRI of the brain was obtained on 7/15 and revealed an extensive left MCA CVA as well as a small CVA in the right frontal lobe with a subarachnoid hemorrhage  -felt to likely be cardioembolic  -patient not currently an anticoagulation candidate therefore no further cardioembolic work-up indicated at present  -Echocardiogram showed an EF of 55 to 46%, LV diastolic parameters are indeterminate. -LDL 77, hemoglobin A1c 5.1 -follow-up in clinic with Dr. Leonie Man in 4 weeks to determine if embolic work-up can be completed at that time based upon candidacy for anticoagulation  -PT/OT recommended SNF  Subarachnoid hemorrhage and intraventricular hemorrhage -Not originally commented on at time of CT head 7/13 but noted on MRI 7/15 and retrospectively felt to have been present at time of 7/13 CT  -Stroke Team feels this is due to hemorrhagic conversion from her CVA  -aspirin has been initiated as per stroke team recommendation  Acute alcohol withdrawal - Alcohol dependence -Has completed treatment with the Ativan CIWA protocol  -continue thiamine supplementation -Currently appears to be stable and calm, no withdrawal symptoms  Hypokalemia -Resolved with replacement -Repeat BMP in 1 week  Acute metabolic encephalopathy -Due to large CVA plus alcohol withdrawal in setting of baseline probable developing dementia  -alcohol withdrawal has now stabilized -Like this may be her  new baseline  Traumatic rhabdomyolysis -Due to fall and prolonged downtime  -CK peaked at 2943, placed on IV fluids -CK has trended downward to  144  Right humerus fracture -Evaluated by Trauma Ortho at time of presentation  -plan is for sling and outpatient monitoring  -hopeful to avoid surgery  -patient is frequently refusing to wear sling and this may interfere with her healing  Traumatic fall -Patient underwent multiple radiological studies including pelvic x-ray, x-ray of the left foot, right shoulder, CT scan of the head, CT scan of the cervical spine, CT scan of the maxillofacial area, CT scan of the chest abdomen and pelvis. Apart from the injury to her humerus no other injuries were noted.  Macrocytic anemia/ Anemia of acute blood loss -Significant blood loss felt to be related to bleeding into forearm and chest related to her humeral fracture  -status post 1 unit PRBC 7/15 with good response  -hemoglobin stable, repeat in 1 week  Essential hypertension -Goal to keep systolic below 814 in setting of subarachnoid/IVH  -clonidine added during this admission and increased 7/19 -BP stable  Chronic combined systolic and diastolic CHF -No previous echocardiogram in our system  -stress test records from 2017 suggest EF 45-55% -clinically compensated -TTE this admit notes EF 55-60% and indeterminate diastolic parameters -no evidence of volume overload at present  Hyperlipidemia -Continue statin   Goals of care -Given advanced age and history suggestive of progressive dementia, Palliative Care was consulted and appreciated-commended outpatient palliative care on discharge -Currently patient is DNR  Procedures: Echocardiogram  Consultations: Neurology Palliative care Trauma Ortho  Discharge Exam: Vitals:   12/15/19 0738 12/15/19 1148  BP: 128/74 126/66  Pulse: 97 (!) 105  Resp: 16 18  Temp: 98.6 F (37 C) 98.5 F (36.9 C)  SpO2: 96% 98%     General: Well developed, Elderly, NAD  HEENT: NCAT, mucous membranes moist.  Cardiovascular: S1 S2 auscultated, RRR, no murmur  Respiratory: Clear to  auscultation bilaterally  Abdomen: Soft, nontender, nondistended, + bowel sounds  Extremities: warm dry without cyanosis clubbing or edema, extensive ecchymosis particularly on the right shoulder and chest  Discharge Instructions Discharge Instructions    Ambulatory referral to Neurology   Complete by: As directed    Follow up with Dr. Leonie Man at [facility] in 4-6 weeks. Too complicated for NP to follow. Thanks.   Discharge instructions   Complete by: As directed    Patient will be discharged to skilled nursing facility, continue speech, physical, and occupational therapy.  Patient will need to follow up with primary care provider within one week of discharge, repeat CBC and BMP.  Follow-up with neurology.  Follow-up with orthopedics.  Patient should continue medications as prescribed.  Patient should follow a dysphagia 2 diet.     Allergies as of 12/15/2019      Reactions   Demerol [meperidine] Nausea And Vomiting   Novocain [procaine]       Medication List    STOP taking these medications   losartan 50 MG tablet Commonly known as: COZAAR     TAKE these medications   aspirin EC 81 MG tablet Take 81 mg by mouth daily.   atorvastatin 40 MG tablet Commonly known as: LIPITOR Take 1 tablet by mouth daily.   carvedilol 6.25 MG tablet Commonly known as: COREG Take 1 tablet by mouth 2 (two) times daily.   cloNIDine 0.2 mg/24hr patch Commonly known as: CATAPRES - Dosed in mg/24 hr Place 1 patch (0.2 mg total)  onto the skin once a week. Start taking on: December 20, 2019   DULoxetine 30 MG capsule Commonly known as: CYMBALTA Take 1 capsule by mouth daily.   feeding supplement (ENSURE ENLIVE) Liqd Take 237 mLs by mouth 3 (three) times daily between meals.   folic acid 1 MG tablet Commonly known as: FOLVITE Take 1 tablet (1 mg total) by mouth daily. Start taking on: December 16, 2019   ICaps MV Tabs Take 2 tablets by mouth at bedtime.   POLY-IRON 150 PO Take 1 tablet by mouth 2 (two)  times daily.   thiamine 100 MG tablet Take 1 tablet (100 mg total) by mouth daily. Start taking on: December 16, 2019   traMADol 50 MG tablet Commonly known as: ULTRAM Take 0.5 tablets (25 mg total) by mouth every 6 (six) hours as needed for severe pain.   Vitamin D3 1.25 MG (50000 UT) Caps Take 50,000 Units by mouth See admin instructions. Every other week.      Allergies  Allergen Reactions  . Demerol [Meperidine] Nausea And Vomiting  . Novocain [Procaine]     Follow-up Information    Garvin Fila, MD. Schedule an appointment as soon as possible for a visit in 4 week(s).   Specialties: Neurology, Radiology Contact information: [address] [place] [address] [phone]        Reynold Bowen, MD. Schedule an appointment as soon as possible for a visit in 1 week(s).   Specialty: Endocrinology Why: hospital follow up Contact information: [place] [address] [phone]        Belva Crome, MD .   Specialty: Cardiology Contact information: [phone] [address] [place] [address] [phone]        Altamese Monteagle, MD. Schedule an appointment as soon as possible for a visit in 2 week(s).   Specialty: Orthopedic Surgery Why: Hospital follow up Contact information: [place] [address] [phone]                The results of significant diagnostics from this hospitalization (including imaging, microbiology, ancillary and laboratory) are listed below for reference.    Significant Diagnostic Studies: CT ANGIO HEAD W OR WO CONTRAST  Result Date: 12/09/2019 CLINICAL DATA:  86 year old female with neurologic deficit and ischemia in the left MCA territory and right medial frontal lobe on MRI today. Recent fall. EXAM: CT ANGIOGRAPHY HEAD AND NECK TECHNIQUE: Multidetector CT imaging of the head and neck was performed using the standard protocol during bolus administration of  intravenous contrast. Multiplanar CT image reconstructions and MIPs were obtained to evaluate the vascular anatomy. Carotid stenosis measurements (when applicable) are obtained utilizing NASCET criteria, using the distal internal carotid diameter as the denominator. CONTRAST:  32mL OMNIPAQUE IOHEXOL 350 MG/ML SOLN COMPARISON:  Brain MRI earlier today. FINDINGS: CTA NECK Skeleton: Proximal right humerus fracture is comminuted and appears distracted on series 5, image 123. Surrounding soft tissue swelling/hematoma. No other acute osseous abnormality identified. Chronic right upper rib fractures. Hyperostosis of the calvarium. Cervical facet arthropathy, although mild for age. Upper chest: Partially visible central pulmonary artery enlargement. Trace layering pleural effusions. Negative upper lung parenchyma. No superior mediastinal lymphadenopathy. Other neck: No acute finding identified. Submandibular gland atrophy. Aortic arch: 3 vessel arch configuration. Calcified aortic atherosclerosis. Right carotid system: No brachiocephalic artery plaque or stenosis. Tortuous proximal right CCA without plaque or stenosis. Mild calcified plaque at the right ICA origin without stenosis. Left carotid  system: Minimal left CCA plaque. Tortuous left CCA without stenosis. Minimal calcified plaque at the bifurcation primarily affects the left ECA. Negative left ICA to the skull base. Vertebral arteries: Calcified plaque at the right subclavian artery origin without stenosis. Normal right vertebral artery origin. But non dominant and diminutive right vertebral artery although patent to the skull base without stenosis. Minimal plaque in the proximal left subclavian artery without stenosis. Dominant left vertebral artery origin is normal. Tortuous left V1 segment. Widely patent left vertebral to the skull base without plaque. CTA HEAD Posterior circulation: The left vertebral artery supplies the basilar and the right vertebral terminates  in PICA. Normal left PICA origin. Patent left vertebral and basilar artery with mild irregularity but no stenosis. Patent SCA and PCA origins. Tortuous left P1 segment. Bilateral PCA branches are within normal limits. Anterior circulation: Both ICA siphons are patent. On the left there is mild calcified plaque without stenosis. And there is an infundibulum suspected at what appears to be a tortuous left posterior communicating artery (series 9, image 108). On the right there is mild siphon calcified plaque without stenosis. And a small right posterior communicating artery is also present. Patent carotid termini. Patent MCA and ACA origins. There is mild to moderate irregularity and stenosis of the right ACA A1, the left is not obviously dominant. Anterior communicating artery and bilateral ACA branches are within normal limits. Right MCA M1 segment and bifurcation are patent without stenosis. Right MCA branches are within normal limits. Left MCA M1 segment and bifurcation are patent without stenosis. No left MCA branch occlusion identified. Left MCA branches are within normal limits. Venous sinuses: Asymmetric left MCA venous and/or leptomeningeal enhancement, likely post ischemic. The major dural venous sinuses appear to be patent although contrast timing is early at the skull base. Anatomic variants: Dominant left vertebral artery supplies the basilar. Right vertebral terminates in PICA. Review of the MIP images confirms the above findings IMPRESSION: 1. Negative for large vessel occlusion. No Left MCA branch occlusion identified. Post ischemic/luxury perfusion enhancement suspected in the Left MCA territory. 2. Mild for age atherosclerosis in the head and neck. However, there is mild to moderate Right ACA A1 segment stenosis. 3. Re-demonstrated comminuted and distracted proximal right humerus fracture with surrounding soft tissue swelling/hematoma. 4. Trace layering pleural effusions. 5. Aortic Atherosclerosis  (ICD10-I70.0). Electronically Signed   By: Genevie Ann M.D.   On: 12/09/2019 22:53   CT HEAD WO CONTRAST  Result Date: 12/07/2019 CLINICAL DATA:  Fall EXAM: CT HEAD WITHOUT CONTRAST TECHNIQUE: Contiguous axial images were obtained from the base of the skull through the vertex without intravenous contrast. COMPARISON:  None. FINDINGS: Brain: There is atrophy and chronic small vessel disease changes. No acute intracranial abnormality. Specifically, no hemorrhage, hydrocephalus, mass lesion, acute infarction, or significant intracranial injury. Vascular: No hyperdense vessel or unexpected calcification. Skull: No acute calvarial abnormality. Sinuses/Orbits: Visualized paranasal sinuses and mastoids clear. Orbital soft tissues unremarkable. Other: None IMPRESSION: Atrophy, chronic microvascular disease. No acute intracranial abnormality. Electronically Signed   By: Rolm Baptise M.D.   On: 12/07/2019 10:17   CT ANGIO NECK W OR WO CONTRAST  Result Date: 12/09/2019 CLINICAL DATA:  86 year old female with neurologic deficit and ischemia in the left MCA territory and right medial frontal lobe on MRI today. Recent fall. EXAM: CT ANGIOGRAPHY HEAD AND NECK TECHNIQUE: Multidetector CT imaging of the head and neck was performed using the standard protocol during bolus administration of intravenous contrast. Multiplanar CT image reconstructions and  MIPs were obtained to evaluate the vascular anatomy. Carotid stenosis measurements (when applicable) are obtained utilizing NASCET criteria, using the distal internal carotid diameter as the denominator. CONTRAST:  35mL OMNIPAQUE IOHEXOL 350 MG/ML SOLN COMPARISON:  Brain MRI earlier today. FINDINGS: CTA NECK Skeleton: Proximal right humerus fracture is comminuted and appears distracted on series 5, image 123. Surrounding soft tissue swelling/hematoma. No other acute osseous abnormality identified. Chronic right upper rib fractures. Hyperostosis of the calvarium. Cervical facet  arthropathy, although mild for age. Upper chest: Partially visible central pulmonary artery enlargement. Trace layering pleural effusions. Negative upper lung parenchyma. No superior mediastinal lymphadenopathy. Other neck: No acute finding identified. Submandibular gland atrophy. Aortic arch: 3 vessel arch configuration. Calcified aortic atherosclerosis. Right carotid system: No brachiocephalic artery plaque or stenosis. Tortuous proximal right CCA without plaque or stenosis. Mild calcified plaque at the right ICA origin without stenosis. Left carotid system: Minimal left CCA plaque. Tortuous left CCA without stenosis. Minimal calcified plaque at the bifurcation primarily affects the left ECA. Negative left ICA to the skull base. Vertebral arteries: Calcified plaque at the right subclavian artery origin without stenosis. Normal right vertebral artery origin. But non dominant and diminutive right vertebral artery although patent to the skull base without stenosis. Minimal plaque in the proximal left subclavian artery without stenosis. Dominant left vertebral artery origin is normal. Tortuous left V1 segment. Widely patent left vertebral to the skull base without plaque. CTA HEAD Posterior circulation: The left vertebral artery supplies the basilar and the right vertebral terminates in PICA. Normal left PICA origin. Patent left vertebral and basilar artery with mild irregularity but no stenosis. Patent SCA and PCA origins. Tortuous left P1 segment. Bilateral PCA branches are within normal limits. Anterior circulation: Both ICA siphons are patent. On the left there is mild calcified plaque without stenosis. And there is an infundibulum suspected at what appears to be a tortuous left posterior communicating artery (series 9, image 108). On the right there is mild siphon calcified plaque without stenosis. And a small right posterior communicating artery is also present. Patent carotid termini. Patent MCA and ACA  origins. There is mild to moderate irregularity and stenosis of the right ACA A1, the left is not obviously dominant. Anterior communicating artery and bilateral ACA branches are within normal limits. Right MCA M1 segment and bifurcation are patent without stenosis. Right MCA branches are within normal limits. Left MCA M1 segment and bifurcation are patent without stenosis. No left MCA branch occlusion identified. Left MCA branches are within normal limits. Venous sinuses: Asymmetric left MCA venous and/or leptomeningeal enhancement, likely post ischemic. The major dural venous sinuses appear to be patent although contrast timing is early at the skull base. Anatomic variants: Dominant left vertebral artery supplies the basilar. Right vertebral terminates in PICA. Review of the MIP images confirms the above findings IMPRESSION: 1. Negative for large vessel occlusion. No Left MCA branch occlusion identified. Post ischemic/luxury perfusion enhancement suspected in the Left MCA territory. 2. Mild for age atherosclerosis in the head and neck. However, there is mild to moderate Right ACA A1 segment stenosis. 3. Re-demonstrated comminuted and distracted proximal right humerus fracture with surrounding soft tissue swelling/hematoma. 4. Trace layering pleural effusions. 5. Aortic Atherosclerosis (ICD10-I70.0). Electronically Signed   By: Genevie Ann M.D.   On: 12/09/2019 22:53   CT CHEST W CONTRAST  Result Date: 12/07/2019 CLINICAL DATA:  Status post fall. EXAM: CT CHEST, ABDOMEN, AND PELVIS WITH CONTRAST TECHNIQUE: Multidetector CT imaging of the chest, abdomen and  pelvis was performed following the standard protocol during bolus administration of intravenous contrast. CONTRAST:  146mL OMNIPAQUE IOHEXOL 300 MG/ML  SOLN COMPARISON:  None. FINDINGS: CT CHEST FINDINGS Cardiovascular: Atherosclerosis of thoracic aorta is noted without aneurysm or dissection. Normal cardiac size. No pericardial effusion is noted.  Mediastinum/Nodes: Moderate size sliding-type hiatal hernia is noted. Thyroid gland is unremarkable. No adenopathy is noted. Lungs/Pleura: Lungs are clear. No pleural effusion or pneumothorax. Musculoskeletal: Severely comminuted and displaced proximal right humeral head and neck fracture is noted. CT ABDOMEN PELVIS FINDINGS Hepatobiliary: No gallstones or biliary dilatation is noted. 1.6 x 1.1 cm enhancing abnormality is noted in dome of right hepatic lobe which appears to have both supplying portal branch and draining hepatic vein, suggesting portosystemic shunt. Pancreas: Unremarkable. No pancreatic ductal dilatation or surrounding inflammatory changes. Spleen: Normal in size without focal abnormality. Adrenals/Urinary Tract: Adrenal glands are unremarkable. Kidneys are normal, without renal calculi, focal lesion, or hydronephrosis. Bladder is unremarkable. Stomach/Bowel: There is no evidence of bowel obstruction or inflammation. Diverticulosis is noted throughout the colon without inflammation. Status post appendectomy. Vascular/Lymphatic: Aortic atherosclerosis. No enlarged abdominal or pelvic lymph nodes. Reproductive: Uterus and bilateral adnexa are unremarkable. Other: No abdominal wall hernia or abnormality. No abdominopelvic ascites. Musculoskeletal: No acute or significant osseous findings. IMPRESSION: 1. Severely comminuted and displaced proximal right humeral head and neck fracture is noted. 2. Moderate size sliding-type hiatal hernia. 3. 1.6 x 1.1 cm enhancing abnormality is noted in dome of right hepatic lobe which appears to have both supplying portal branch and draining hepatic vein, suggesting portosystemic shunt. 4. Diverticulosis is noted throughout the colon without inflammation. Aortic Atherosclerosis (ICD10-I70.0). Electronically Signed   By: Marijo Conception M.D.   On: 12/07/2019 10:35   CT CERVICAL SPINE WO CONTRAST  Result Date: 12/07/2019 CLINICAL DATA:  Fall, neck trauma EXAM: CT  CERVICAL SPINE WITHOUT CONTRAST TECHNIQUE: Multidetector CT imaging of the cervical spine was performed without intravenous contrast. Multiplanar CT image reconstructions were also generated. COMPARISON:  None. FINDINGS: Alignment: Normal Skull base and vertebrae: No acute fracture. No primary bone lesion or focal pathologic process. Soft tissues and spinal canal: No prevertebral fluid or swelling. No visible canal hematoma. Disc levels: Diffuse degenerative facet disease bilaterally. Disc spaces maintained. Upper chest: No acute findings Other: None IMPRESSION: Diffuse degenerative facet disease.  No acute bony abnormality. Electronically Signed   By: Rolm Baptise M.D.   On: 12/07/2019 10:19   MR BRAIN WO CONTRAST  Result Date: 12/09/2019 CLINICAL DATA:  Neuro deficit, acute, stroke suspected. EXAM: MRI HEAD WITHOUT CONTRAST TECHNIQUE: Multiplanar, multiecho pulse sequences of the brain and surrounding structures were obtained without intravenous contrast. COMPARISON:  Head CT 12/07/2019 FINDINGS: Brain: Patient was unable to tolerate the full examination. As a result, only axial and coronal diffusion-weighted sequences, and axial T1 weighted sequence, axial SWI sequence and axial T2/FLAIR sequence could be obtained. The acquired sequences are motion degraded. Most notably, there is severe motion degradation of the sagittal T1 weighted sequence, moderate motion degradation of the axial SWI sequence and severe motion degradation of the axial T2/FLAIR sequence. There is fairly extensive and predominantly cortical restricted diffusion within the left MCA vascular territory, within the anterolateral left frontal lobe, left frontal operculum, left parietal lobe, left temporal lobe and left insula as well as subinsular region consistent with acute/early subacute infarction. There is an additional acute/early subacute infarct measuring 11 mm within the paramedian right frontal lobe (series 3, image 34). There is  scattered small  volume acute/early subacute subarachnoid hemorrhage, most notably along the interhemispheric fissure, left frontal lobe and within the left sylvian fissure. Additionally, there is small volume intraventricular hemorrhage within the occipital horns bilaterally. Small volume subarachnoid and intraventricular hemorrhage was present on the head CT of 12/07/2019, although seen to better advantage on today's study. The subarachnoid hemorrhage within the interhemispheric fissure appears new from this prior exam. Mild generalized parenchymal atrophy. Mild chronic small vessel ischemic disease better appreciated on prior CT due to motion degradation on the current study. No intracranial mass is identified. No evidence of hydrocephalus.  No midline shift Vascular: Poorly assessed on the acquired sequences. Skull and upper cervical spine: No focal marrow lesion is identified within limitations of motion degradation Sinuses/Orbits: Orbits poorly assessed on the acquired sequences. Mild ethmoid sinus mucosal thickening. No appreciable significant mastoid effusion. These results were called by telephone at the time of interpretation on 12/09/2019 at 11:38 am to provider [facility] , who verbally acknowledged these results. IMPRESSION: Prematurely terminated and significantly motion degraded examination as described. Fairly extensive acute/early subacute left MCA territory infarct, as described. Additional small acute infarct within the paramedian right frontal lobe. Multifocal small volume acute/early subacute subarachnoid hemorrhage and intraventricular hemorrhage as outlined. In retrospect, small volume acute subarachnoid and intraventricular hemorrhage was present on the prior head CT of 12/07/2019, although is seen to better advantage on today's MRI. Subarachnoid hemorrhage within the interhemispheric fissure appears new from this prior exam. Stable mild generalized parenchymal atrophy and chronic small  vessel ischemic disease. Electronically Signed   By: Kellie Simmering DO   On: 12/09/2019 11:39   CT ABDOMEN PELVIS W CONTRAST  Result Date: 12/07/2019 CLINICAL DATA:  Status post fall. EXAM: CT CHEST, ABDOMEN, AND PELVIS WITH CONTRAST TECHNIQUE: Multidetector CT imaging of the chest, abdomen and pelvis was performed following the standard protocol during bolus administration of intravenous contrast. CONTRAST:  155mL OMNIPAQUE IOHEXOL 300 MG/ML  SOLN COMPARISON:  None. FINDINGS: CT CHEST FINDINGS Cardiovascular: Atherosclerosis of thoracic aorta is noted without aneurysm or dissection. Normal cardiac size. No pericardial effusion is noted. Mediastinum/Nodes: Moderate size sliding-type hiatal hernia is noted. Thyroid gland is unremarkable. No adenopathy is noted. Lungs/Pleura: Lungs are clear. No pleural effusion or pneumothorax. Musculoskeletal: Severely comminuted and displaced proximal right humeral head and neck fracture is noted. CT ABDOMEN PELVIS FINDINGS Hepatobiliary: No gallstones or biliary dilatation is noted. 1.6 x 1.1 cm enhancing abnormality is noted in dome of right hepatic lobe which appears to have both supplying portal branch and draining hepatic vein, suggesting portosystemic shunt. Pancreas: Unremarkable. No pancreatic ductal dilatation or surrounding inflammatory changes. Spleen: Normal in size without focal abnormality. Adrenals/Urinary Tract: Adrenal glands are unremarkable. Kidneys are normal, without renal calculi, focal lesion, or hydronephrosis. Bladder is unremarkable. Stomach/Bowel: There is no evidence of bowel obstruction or inflammation. Diverticulosis is noted throughout the colon without inflammation. Status post appendectomy. Vascular/Lymphatic: Aortic atherosclerosis. No enlarged abdominal or pelvic lymph nodes. Reproductive: Uterus and bilateral adnexa are unremarkable. Other: No abdominal wall hernia or abnormality. No abdominopelvic ascites. Musculoskeletal: No acute or  significant osseous findings. IMPRESSION: 1. Severely comminuted and displaced proximal right humeral head and neck fracture is noted. 2. Moderate size sliding-type hiatal hernia. 3. 1.6 x 1.1 cm enhancing abnormality is noted in dome of right hepatic lobe which appears to have both supplying portal branch and draining hepatic vein, suggesting portosystemic shunt. 4. Diverticulosis is noted throughout the colon without inflammation. Aortic Atherosclerosis (ICD10-I70.0). Electronically Signed   By: Jeneen Rinks  Murlean Caller M.D.   On: 12/07/2019 10:35   DG Pelvis Portable  Result Date: 12/07/2019 CLINICAL DATA:  Recent fall. EXAM: PORTABLE PELVIS 1-2 VIEWS COMPARISON:  None. FINDINGS: Negative for pelvic fracture. Mild degenerative change left hip with joint space narrowing and spurring. No acute bony lesion. IMPRESSION: No acute abnormality pain Electronically Signed   By: Franchot Gallo M.D.   On: 12/07/2019 09:11   DG Chest Port 1 View  Result Date: 12/07/2019 CLINICAL DATA:  Fall several days ago. EXAM: PORTABLE CHEST 1 VIEW COMPARISON:  None. FINDINGS: Lungs are well aerated. No infiltrate effusion or edema. Heart size upper normal. Comminuted fracture proximal right humerus. Multiple chronic right rib fractures. IMPRESSION: No acute cardiopulmonary abnormality Comminuted fracture proximal right humerus. Electronically Signed   By: Franchot Gallo M.D.   On: 12/07/2019 09:10   DG Shoulder Right Port  Result Date: 12/07/2019 CLINICAL DATA:  Recent fall. EXAM: PORTABLE RIGHT SHOULDER COMPARISON:  10/02/2013 FINDINGS: Comminuted fracture right humeral neck and humeral head. Ventral angulation. Multiple comminuted bone fragments. AC joint intact Chronic right rib fractures. IMPRESSION: Comminuted and angulated fracture proximal right humerus. Electronically Signed   By: Franchot Gallo M.D.   On: 12/07/2019 09:12   DG Foot Complete Left  Result Date: 12/07/2019 CLINICAL DATA:  Left foot pain. Fell 2 days ago.  EXAM: LEFT FOOT - COMPLETE 3+ VIEW COMPARISON:  None. FINDINGS: Moderate age related degenerative changes. No acute fracture is identified. Large calcaneal heel spur noted. IMPRESSION: Age related degenerative changes but no acute fracture. Electronically Signed   By: Marijo Sanes M.D.   On: 12/07/2019 09:13   DG Swallowing Func-Speech Pathology  Result Date: 12/13/2019 Objective Swallowing Evaluation: Type of Study: MBS-Modified Barium Swallow Study  Patient Details Name: Jennifer Rocha MRN: [id number] Date of Birth: September 18, 1927 Today's Date: 12/13/2019 Time: SLP Start Time (ACUTE ONLY): 1340 -SLP Stop Time (ACUTE ONLY): 1405 SLP Time Calculation (min) (ACUTE ONLY): 25 min Past Medical History: Past Medical History: Diagnosis Date . Alcohol dependence ([place])  . HLD (hyperlipidemia)  . Hypertension  . Left heart failure ([place])  . Non-ischemic cardiomyopathy ([place])   with EF 20% improving to 50% Past Surgical History: Past Surgical History: Procedure Laterality Date . ANKLE SURGERY Right  . APPENDECTOMY  1941 . TONSILLECTOMY   HPI: Pt is a 86 yo female presenting s/p fall with prolonged downtime. Admitted with rhabdomyolysis and humeral head fx. Due to persistent confusion and speech difficulty MRI of the the brain was accomplished 7/15 and revealed an extensive left MCA CVA as well as a small CVA in the right frontal lobe with a subarachnoid hemorrhage - felt to likely be cardioembolic .Per chart, family reported other recent falls and concern for increased confusion and memory loss. Family also reports a h/o coughing with solids PTA, although never evaluated. CXR and CT Chest both show clear lungs. PMH includes: CHF, HTN, HLD, ETOH dependence  Subjective: alert, cooperative Assessment / Plan / Recommendation CHL IP CLINICAL IMPRESSIONS 12/13/2019 Clinical Impression Pt presents with a mild oropharyngeal dysphagia marked by difficulty with bolus cohesion, piecemeal release into the pharynx, premature loss of thin  liquids into the pharynx, and the presence of oral residue post-swallow.  Due to issues in timing, thin and nectar-thick liquids were both observed, on one occasion each, to be aspirated before the onset of laryngeal vestibule closure (LVC). LVC was complete.  Pt did produce a spontaneous cough in reaction to the trace aspiration events, which will allow caregivers  at bedside to make reasonable observations about the presence/absence of aspiration.  Pt has been tolerating a dysphagia 1 diet with thin liquids for several days with no deterioration in BS.  Recommend advancing solids to dysphagia 2, continue thin liquids; give meds whole in puree.  Pt's daughter, [name], was present for study - we discussed results and recommendations.  SLP will follow during course of hospital admission.  SLP Visit Diagnosis Dysphagia, oropharyngeal phase (R13.12) Attention and concentration deficit following -- Frontal lobe and executive function deficit following -- Impact on safety and function Mild aspiration risk   CHL IP TREATMENT RECOMMENDATION 12/13/2019 Treatment Recommendations Therapy as outlined in treatment plan below   Prognosis 12/08/2019 Prognosis for Safe Diet Advancement Fair Barriers to Reach Goals Time post onset;Cognitive deficits Barriers/Prognosis Comment -- CHL IP DIET RECOMMENDATION 12/13/2019 SLP Diet Recommendations Dysphagia 2 (Fine chop) solids;Thin liquid Liquid Administration via Straw;Cup Medication Administration Whole meds with liquid Compensations Minimize environmental distractions;Slow rate;Small sips/bites;Follow solids with liquid Postural Changes Remain semi-upright after after feeds/meals (Comment)   CHL IP OTHER RECOMMENDATIONS 12/13/2019 Recommended Consults -- Oral Care Recommendations Oral care BID Other Recommendations --   CHL IP FOLLOW UP RECOMMENDATIONS 12/13/2019 Follow up Recommendations Skilled Nursing facility;24 hour supervision/assistance   CHL IP FREQUENCY AND DURATION 12/13/2019 Speech  Therapy Frequency (ACUTE ONLY) min 2x/week Treatment Duration 2 weeks      CHL IP ORAL PHASE 12/13/2019 Oral Phase Impaired Oral - Pudding Teaspoon -- Oral - Pudding Cup -- Oral - Honey Teaspoon -- Oral - Honey Cup -- Oral - Nectar Teaspoon -- Oral - Nectar Cup -- Oral - Nectar Straw Weak lingual manipulation;Piecemeal swallowing;Delayed oral transit Oral - Thin Teaspoon -- Oral - Thin Cup -- Oral - Thin Straw Weak lingual manipulation;Piecemeal swallowing;Delayed oral transit Oral - Puree Weak lingual manipulation;Piecemeal swallowing;Delayed oral transit Oral - Mech Soft -- Oral - Regular Weak lingual manipulation;Piecemeal swallowing;Delayed oral transit Oral - Multi-Consistency -- Oral - Pill -- Oral Phase - Comment --  CHL IP PHARYNGEAL PHASE 12/13/2019 Pharyngeal Phase Impaired Pharyngeal- Pudding Teaspoon -- Pharyngeal -- Pharyngeal- Pudding Cup -- Pharyngeal -- Pharyngeal- Honey Teaspoon -- Pharyngeal -- Pharyngeal- Honey Cup -- Pharyngeal -- Pharyngeal- Nectar Teaspoon -- Pharyngeal -- Pharyngeal- Nectar Cup -- Pharyngeal -- Pharyngeal- Nectar Straw Penetration/Aspiration before swallow;Penetration/Apiration after swallow;Trace aspiration Pharyngeal Material enters airway, passes BELOW cords and not ejected out despite cough attempt by patient Pharyngeal- Thin Teaspoon -- Pharyngeal -- Pharyngeal- Thin Cup -- Pharyngeal -- Pharyngeal- Thin Straw Penetration/Aspiration before swallow;Trace aspiration Pharyngeal Material enters airway, passes BELOW cords and not ejected out despite cough attempt by patient Pharyngeal- Puree Delayed swallow initiation-vallecula Pharyngeal -- Pharyngeal- Mechanical Soft -- Pharyngeal -- Pharyngeal- Regular Delayed swallow initiation-vallecula Pharyngeal -- Pharyngeal- Multi-consistency -- Pharyngeal -- Pharyngeal- Pill -- Pharyngeal -- Pharyngeal Comment --  CHL IP CERVICAL ESOPHAGEAL PHASE 12/13/2019 Cervical Esophageal Phase WFL Pudding Teaspoon -- Pudding Cup -- Honey Teaspoon  -- Honey Cup -- Nectar Teaspoon -- Nectar Cup -- Nectar Straw -- Thin Teaspoon -- Thin Cup -- Thin Straw -- Puree -- Mechanical Soft -- Regular -- Multi-consistency -- Pill -- Cervical Esophageal Comment -- Juan Quam Laurice 12/13/2019, 2:50 PM              ECHOCARDIOGRAM COMPLETE  Result Date: 12/09/2019    ECHOCARDIOGRAM REPORT   Patient Name:   Jennifer Rocha Date of Exam: 12/09/2019 Medical Rec #:  [id number]      Height:       63.0 in Accession #:  [id number]     Weight:       143.3 lb Date of Birth:  [birth year]      BSA:          1.678 m Patient Age:    86 years       BP:           148/59 mmHg Patient Gender: F              HR:           83 bpm. Exam Location:  Inpatient Procedure: 2D Echo Indications:    434.91 stroke  History:        Patient has no prior history of Echocardiogram examinations.                 Cardiomyopathy; Risk Factors:Hypertension and Dyslipidemia.                 Alcohol dependance.  Sonographer:    Jannett Celestine RDCS (AE) Referring Phys: [id number]  Sonographer Comments: extremely limited mobility with arm sling on right arm IMPRESSIONS  1. Technically difficult study. Left ventricular ejection fraction, by estimation, is 55 to 60%. The left ventricle has normal function. The left ventricle has no regional wall motion abnormalities. There is mild left ventricular hypertrophy. Left ventricular diastolic parameters are indeterminate.  2. Right ventricular systolic function is normal. The right ventricular size is normal. Tricuspid regurgitation signal is inadequate for assessing PA pressure.  3. The mitral valve is normal in structure. Trivial mitral valve regurgitation.  4. The aortic valve was not well visualized. Aortic valve regurgitation is not visualized. No aortic stenosis is present.  5. The inferior vena cava is dilated in size with <50% respiratory variability, suggesting right atrial pressure of 15 mmHg. FINDINGS  Left Ventricle: Left ventricular ejection  fraction, by estimation, is 55 to 60%. The left ventricle has normal function. The left ventricle has no regional wall motion abnormalities. The left ventricular internal cavity size was normal in size. There is  mild left ventricular hypertrophy. Left ventricular diastolic parameters are indeterminate. Right Ventricle: The right ventricular size is normal. Right vetricular wall thickness was not assessed. Right ventricular systolic function is normal. Tricuspid regurgitation signal is inadequate for assessing PA pressure. Left Atrium: Left atrial size was normal in size. Right Atrium: Right atrial size was normal in size. Pericardium: There is no evidence of pericardial effusion. Presence of pericardial fat pad. Mitral Valve: The mitral valve is normal in structure. Trivial mitral valve regurgitation. Tricuspid Valve: The tricuspid valve is grossly normal. Tricuspid valve regurgitation is not demonstrated. Aortic Valve: The aortic valve was not well visualized. Aortic valve regurgitation is not visualized. No aortic stenosis is present. Pulmonic Valve: The pulmonic valve was not well visualized. Pulmonic valve regurgitation is not visualized. Aorta: The aortic root is normal in size and structure. Venous: The inferior vena cava is dilated in size with less than 50% respiratory variability, suggesting right atrial pressure of 15 mmHg. IAS/Shunts: The interatrial septum was not well visualized.  LEFT VENTRICLE PLAX 2D LVIDd:         5.00 cm  Diastology LVIDs:         3.30 cm  LV e' medial: 6.31 cm/s LV PW:         1.00 cm LV IVS:        0.80 cm LVOT diam:     2.00 cm LV SV:         57 LV  SV Index:   34 LVOT Area:     3.14 cm  RIGHT VENTRICLE TAPSE (M-mode): 1.9 cm LEFT ATRIUM             Index       RIGHT ATRIUM           Index LA diam:        3.10 cm 1.85 cm/m  RA Area:     14.20 cm LA Vol (A2C):   32.8 ml 19.54 ml/m RA Volume:   35.60 ml  21.21 ml/m LA Vol (A4C):   30.9 ml 18.41 ml/m LA Biplane Vol: 32.8 ml  19.54 ml/m  AORTIC VALVE LVOT Vmax:   70.50 cm/s LVOT Vmean:  48.600 cm/s LVOT VTI:    0.182 m  AORTA Ao Root diam: 2.60 cm  SHUNTS Systemic VTI:  0.18 m Systemic Diam: 2.00 cm Oswaldo Milian MD Electronically signed by Oswaldo Milian MD Signature Date/Time: 12/09/2019/10:51:52 PM    Final    CT MAXILLOFACIAL WO CONTRAST  Result Date: 12/07/2019 CLINICAL DATA:  Fall, left facial bruising EXAM: CT MAXILLOFACIAL WITHOUT CONTRAST TECHNIQUE: Multidetector CT imaging of the maxillofacial structures was performed. Multiplanar CT image reconstructions were also generated. COMPARISON:  None. FINDINGS: Osseous: No fracture or mandibular dislocation. No destructive process. Orbits: Negative. No traumatic or inflammatory finding. Sinuses: Clear Soft tissues: Negative Limited intracranial: No acute findings IMPRESSION: No facial or orbital fracture. Electronically Signed   By: Rolm Baptise M.D.   On: 12/07/2019 10:18    Microbiology: Recent Results (from the past 240 hour(s))  SARS Coronavirus 2 by RT PCR (hospital order, performed in [facility] hospital lab) Nasopharyngeal Nasopharyngeal Swab     Status: None   Collection Time: 12/07/19 11:39 AM   Specimen: Nasopharyngeal Swab  Result Value Ref Range Status   SARS Coronavirus 2 NEGATIVE NEGATIVE Final    Comment: (NOTE) SARS-CoV-2 target nucleic acids are NOT DETECTED.  The SARS-CoV-2 RNA is generally detectable in upper and lower respiratory specimens during the acute phase of infection. The lowest concentration of SARS-CoV-2 viral copies this assay can detect is 250 copies / mL. A negative result does not preclude SARS-CoV-2 infection and should not be used as the sole basis for treatment or other patient management decisions.  A negative result may occur with improper specimen collection / handling, submission of specimen other than nasopharyngeal swab, presence of viral mutation(s) within the areas targeted by this assay, and  inadequate number of viral copies (<250 copies / mL). A negative result must be combined with clinical observations, patient history, and epidemiological information.  Fact Sheet for Patients:   StrictlyIdeas.no  Fact Sheet for Healthcare Providers: BankingDealers.co.za  This test is not yet approved or  cleared by the [place] FDA and has been authorized for detection and/or diagnosis of SARS-CoV-2 by FDA under an Emergency Use Authorization (EUA).  This EUA will remain in effect (meaning this test can be used) for the duration of the COVID-19 declaration under Section 564(b)(1) of the Act, 21 U.S.C. section 360bbb-3(b)(1), unless the authorization is terminated or revoked sooner.  Performed at [facility], [place] [address] [place], [place] [address]      Labs: Basic Metabolic Panel: Recent Labs  Lab 12/10/19 0409 12/11/19 0725 12/12/19 0506 12/13/19 1017 12/14/19 0414  NA 138 137 137 137 138  K 3.5 3.3* 3.1* 3.4* 3.9  CL 104 99 99 100 102  CO2 27 28 29 28 27   GLUCOSE 101* 95 109*  126* 126*  BUN 8 5* 8 7* 7*  CREATININE 0.37* 0.38* 0.45 0.41* 0.38*  CALCIUM 8.6* 8.5* 8.8* 9.0 9.2  MG  --   --  1.9  --   --   PHOS  --   --  3.7  --   --    Liver Function Tests: Recent Labs  Lab 12/09/19 0241 12/10/19 0409 12/11/19 0725 12/12/19 0506 12/14/19 0414  AST 44* 36 28 26 27   ALT 39 35 30 26 24   ALKPHOS 50 55 61 58 66  BILITOT 1.4* 2.1* 2.7* 2.3* 2.1*  PROT 5.0* 5.5* 5.4* 5.4* 5.6*  ALBUMIN 2.8* 3.0* 2.9* 2.9* 3.0*   No results for input(s): LIPASE, AMYLASE in the last 168 hours. No results for input(s): AMMONIA in the last 168 hours. CBC: Recent Labs  Lab 12/10/19 0409 12/11/19 0725 12/12/19 0506 12/13/19 1017 12/14/19 0414  WBC 9.2 8.2 9.3 10.6* 11.0*  HGB 9.3* 10.0* 10.0* 10.9* 10.6*  HCT 29.3* 30.9* 30.8* 34.0* 33.1*  MCV 101.0* 100.3* 100.0 101.2* 100.3*  PLT 269 302 303 335 312   Cardiac  Enzymes: Recent Labs  Lab 12/09/19 0241 12/12/19 0506  CKTOTAL 674* 144   BNP: BNP (last 3 results) No results for input(s): BNP in the last 8760 hours.  ProBNP (last 3 results) No results for input(s): PROBNP in the last 8760 hours.  CBG: No results for input(s): GLUCAP in the last 168 hours.     Signed:  Cristal Ford  [facility] 12/15/2019, 2:55 PM

## 2019-12-17 ENCOUNTER — Encounter: Payer: Self-pay | Admitting: Internal Medicine

## 2019-12-17 ENCOUNTER — Non-Acute Institutional Stay (SKILLED_NURSING_FACILITY): Payer: Medicare Other | Admitting: Internal Medicine

## 2019-12-17 DIAGNOSIS — I69320 Aphasia following cerebral infarction: Secondary | ICD-10-CM

## 2019-12-17 DIAGNOSIS — R627 Adult failure to thrive: Secondary | ICD-10-CM

## 2019-12-17 DIAGNOSIS — F1020 Alcohol dependence, uncomplicated: Secondary | ICD-10-CM

## 2019-12-17 DIAGNOSIS — D649 Anemia, unspecified: Secondary | ICD-10-CM

## 2019-12-17 DIAGNOSIS — T796XXA Traumatic ischemia of muscle, initial encounter: Secondary | ICD-10-CM

## 2019-12-17 DIAGNOSIS — I6340 Cerebral infarction due to embolism of unspecified cerebral artery: Secondary | ICD-10-CM

## 2019-12-17 DIAGNOSIS — I5042 Chronic combined systolic (congestive) and diastolic (congestive) heart failure: Secondary | ICD-10-CM

## 2019-12-17 DIAGNOSIS — Z66 Do not resuscitate: Secondary | ICD-10-CM

## 2019-12-17 DIAGNOSIS — S42291A Other displaced fracture of upper end of right humerus, initial encounter for closed fracture: Secondary | ICD-10-CM

## 2019-12-17 NOTE — Progress Notes (Signed)
Provider:  Rexene Edison. Mariea Clonts, D.O., C.M.D. Location:  [facility] Room Number: [place] of Service:  SNF (31)  PCP: Reynold Bowen, MD Patient Care Team: Reynold Bowen, MD as PCP - General (Endocrinology) Belva Crome, MD as PCP - Cardiology (Cardiology)  Extended Emergency Contact Information Primary Emergency Contact: Alveria Apley of [place] Phone: [phone] Relation: Son Secondary Emergency Contact: Eckerman-Johnston,[name]          [place], [place] of [place] Phone: [phone] Relation: Daughter  Code Status: DNR Goals of Care: Advanced Directive information Advanced Directives 12/17/2019  Does Patient Have a Medical Advance Directive? Yes  Type of Advance Directive Out of facility DNR (pink MOST or yellow form)  Does patient want to make changes to medical advance directive? No - Patient declined   Chief Complaint  Patient presents with  . New Admit To SNF    New admit to SNF     HPI: Patient is a 86 y.o. female with history of chronic combined congestive heart failure, hypertension, some recent memory loss, alcohol dependence, and hyperlipidemia seen today for admission to [facility] status post hospitalization from July 13th thru July 21st.  She had sustained a fall presumably on Sunday (note says neighbors saw her go to church) based upon history from family and neighbors.  She had been unable to get up since that time (presented Tuesday to ED after calling her daughter to tell her she could not get up).  She had sustained a right humeral neck fracture and had mild rhabdomyolysis with CK of 2943.  Dr. Marcelino Scot recommended a splint and outpatient follow-up.  Remainder of trauma imaging was negative.  Hgb was 9.5.    She was admitted to telemetry with fairly aggressive IV fluids at 125 cc an hour and repeat labs.  She had SCDs for DVT prophylaxis due to her considerable bruising.  Aspirin  was initially held as well as her Cozaar during her rhabdo.  At d/c, CK was 144.  She was transfused one unit PRBCs due to her acute blood loss anemia.  She also had macrocytic anemia likely related to her alcohol dependence.  As far as her memory she had began having issues remembering her son's wife's name, repeating herself occasionally, and leaving the garage door open once in a while.  She does still drive but had not gotten lost as far as anyone knew.    She had further work-up of her memory loss during her hospitalization.  She was found to have had acute ischemic strokes extensive in the left MCA distribution and right frontal lobe.  MRI on July 15 revealed extensive left MCA stroke as well as small stroke in the right frontal lobe with a subarachnoid hemorrhage.  This is felt to be cardioembolic he is not an anticoagulation candidate at this point.  She is to follow-up with Dr. Leonie Man in 4 wks to determine if embolic workup can be done per hospital notes, but we can assess if this makes sense over her rehab stay.  She is here for PT, OT, ST.  The stroke team felt her [place] and intraventricular hemorrhage were due to hemorrhagic conversion of her stroke.    She was placed on thiamine and monitored with CIWA protocol and ativan tx due to alcohol withdrawal. Potassium was repleted.  Seems her acute delirium had cleared per d/c summary and this may be her new cognitive baseline.  At d/c here, she is for F/u cbc, bmp recommended at one week.  She's on a dysphagia 2 diet.  She should also f/u with ortho re: the right humeral fx.  She is back on baby asa daily but remains off her ARB.  Has prn clonidine to keep SBP <160 due to hemorrhagic stroke conversion.  When seen, she was pleasant, but aphasic.  She was wearing her sling as directed and had just returned from a PT session where she'd done well.    Past Medical History:  Diagnosis Date  . Alcohol dependence ([place])   . HLD (hyperlipidemia)   .  Hypertension   . Left heart failure ([place])   . Non-ischemic cardiomyopathy ([place])    with EF 20% improving to 50%   Past Surgical History:  Procedure Laterality Date  . ANKLE SURGERY Right   . APPENDECTOMY  1941  . TONSILLECTOMY      Social History   Socioeconomic History  . Marital status: Widowed    Spouse name: Not on file  . Number of children: 3  . Years of education: 40  . Highest education level: Not on file  Occupational History  . Occupation: Retired   Tobacco Use  . Smoking status: Former Smoker    Quit date: 05/27/1953    Years since quitting: 66.6  . Smokeless tobacco: Never Used  Substance and Sexual Activity  . Alcohol use: Yes    Alcohol/week: 21.0 standard drinks    Types: 21 Glasses of wine per week    Comment: 3 glasses per day  . Drug use: Not Currently    Comment: has abused prescription drugs at times  . Sexual activity: Not on file  Other Topics Concern  . Not on file  Social History Narrative   Lives at home by herself   1-2 cups coffee per day   Social Determinants of Health   Financial Resource Strain:   . Difficulty of Paying Living Expenses:   Food Insecurity:   . Worried About Charity fundraiser in the Last Year:   . Arboriculturist in the Last Year:   Transportation Needs:   . Film/video editor (Medical):   Marland Kitchen Lack of Transportation (Non-Medical):   Physical Activity:   . Days of Exercise per Week:   . Minutes of Exercise per Session:   Stress:   . Feeling of Stress :   Social Connections:   . Frequency of Communication with Friends and Family:   . Frequency of Social Gatherings with Friends and Family:   . Attends Religious Services:   . Active Member of Clubs or Organizations:   . Attends Archivist Meetings:   Marland Kitchen Marital Status:     reports that she quit smoking about 66 years ago. She has never used smokeless tobacco. She reports current alcohol use of about 21.0 standard drinks of alcohol per week. She reports  previous drug use.  Functional Status Survey:    Family History  Adopted: Yes  Problem Relation Age of Onset  . Heart failure Mother   . Heart disease Brother   . Alcohol abuse Other        her daughter, addiction "gallops in our family"    Health Maintenance  Topic Date Due  . DEXA SCAN  Never done  . PNA vac Low Risk Adult (1 of 2 - PCV13) Never done  . INFLUENZA VACCINE  12/26/2019  . TETANUS/TDAP  12/26/2023  . COVID-19  Vaccine  Completed    Allergies  Allergen Reactions  . Demerol [Meperidine] Nausea And Vomiting  . Novocain [Procaine]     Outpatient Encounter Medications as of 12/17/2019  Medication Sig  . aspirin EC 81 MG tablet Take 81 mg by mouth daily.  Marland Kitchen atorvastatin (LIPITOR) 40 MG tablet Take 1 tablet by mouth daily.  . carvedilol (COREG) 6.25 MG tablet Take 1 tablet by mouth 2 (two) times daily.   . Cholecalciferol (VITAMIN D3) 50000 units CAPS Take 50,000 Units by mouth See admin instructions. Every other week.  . cloNIDine (CATAPRES - DOSED IN MG/24 HR) 0.2 mg/24hr patch Place 1 patch (0.2 mg total) onto the skin once a week.  . DULoxetine (CYMBALTA) 30 MG capsule Take 1 capsule by mouth daily.   . feeding supplement, ENSURE ENLIVE, (ENSURE ENLIVE) LIQD Take 237 mLs by mouth 3 (three) times daily between meals.  . folic acid (FOLVITE) 1 MG tablet Take 1 tablet (1 mg total) by mouth daily.  . Multiple Vitamins-Minerals (ICAPS MV) TABS Take 2 tablets by mouth at bedtime.   . Polysaccharide Iron Complex (POLY-IRON 150 PO) Take 1 tablet by mouth 2 (two) times daily.  Marland Kitchen thiamine 100 MG tablet Take 1 tablet (100 mg total) by mouth daily.  . traMADol (ULTRAM) 50 MG tablet Take 0.5 tablets (25 mg total) by mouth every 6 (six) hours as needed for severe pain.   No facility-administered encounter medications on file as of 12/17/2019.    Review of Systems  Constitutional: Positive for malaise/fatigue. Negative for chills and fever.  HENT: Negative for congestion.     Eyes:       Glasses  Respiratory: Negative for cough and shortness of breath.   Cardiovascular: Negative for chest pain, palpitations and leg swelling.  Gastrointestinal: Negative for abdominal pain, blood in stool, constipation, diarrhea and melena.  Genitourinary: Negative for dysuria.  Musculoskeletal: Positive for falls and joint pain.  Neurological: Positive for sensory change, speech change and focal weakness. Negative for dizziness and loss of consciousness.  Psychiatric/Behavioral: Positive for memory loss. Negative for depression. The patient is not nervous/anxious and does not have insomnia.     Vitals:   12/17/19 1453  BP: (!) 130/72  Pulse: 81  Temp: (!) 97.4 F (36.3 C)  Weight: 143 lb 4.8 oz (65 kg)  Height: 5\' 3"  (1.6 m)   Body mass index is 25.38 kg/m. Physical Exam Vitals reviewed.  Constitutional:      General: She is not in acute distress.    Appearance: She is not toxic-appearing.  HENT:     Head: Normocephalic and atraumatic.     Right Ear: External ear normal.     Left Ear: External ear normal.     Ears:     Comments: HOH    Nose: No congestion.     Mouth/Throat:     Pharynx: Oropharynx is clear.  Eyes:     Extraocular Movements: Extraocular movements intact.     Conjunctiva/sclera: Conjunctivae normal.     Pupils: Pupils are equal, round, and reactive to light.     Comments: glasses  Cardiovascular:     Rate and Rhythm: Normal rate and regular rhythm.     Heart sounds: No murmur heard.   Pulmonary:     Effort: Pulmonary effort is normal.     Breath sounds: Normal breath sounds. No rales.  Abdominal:     General: Bowel sounds are normal.     Palpations: Abdomen is soft.  Tenderness: There is no abdominal tenderness. There is no guarding or rebound.  Musculoskeletal:     Cervical back: Neck supple.     Right lower leg: No edema.     Left lower leg: No edema.     Comments: ROM intact except not to be using right arm; in manual  wheelchair  Skin:    General: Skin is warm and dry.     Comments: Ecchymoses of right upper arm/shoulder region  Neurological:     Mental Status: She is alert.     Comments: Aphasic speech, dysarthria, also  Psychiatric:        Mood and Affect: Mood normal.     Comments: Was pleasant     Labs reviewed: Basic Metabolic Panel: Recent Labs    12/08/19 0512 12/09/19 0241 12/12/19 0506 12/13/19 1017 12/14/19 0414  NA 140   < > 137 137 138  K 3.0*   < > 3.1* 3.4* 3.9  CL 105   < > 99 100 102  CO2 27   < > 29 28 27   GLUCOSE 104*   < > 109* 126* 126*  BUN 20   < > 8 7* 7*  CREATININE 0.49   < > 0.45 0.41* 0.38*  CALCIUM 8.3*   < > 8.8* 9.0 9.2  MG 2.2  --  1.9  --   --   PHOS  --   --  3.7  --   --    < > = values in this interval not displayed.   Liver Function Tests: Recent Labs    12/11/19 0725 12/12/19 0506 12/14/19 0414  AST 28 26 27   ALT 30 26 24   ALKPHOS 61 58 66  BILITOT 2.7* 2.3* 2.1*  PROT 5.4* 5.4* 5.6*  ALBUMIN 2.9* 2.9* 3.0*   No results for input(s): LIPASE, AMYLASE in the last 8760 hours. No results for input(s): AMMONIA in the last 8760 hours. CBC: Recent Labs    12/12/19 0506 12/13/19 1017 12/14/19 0414  WBC 9.3 10.6* 11.0*  HGB 10.0* 10.9* 10.6*  HCT 30.8* 34.0* 33.1*  MCV 100.0 101.2* 100.3*  PLT 303 335 312   Cardiac Enzymes: Recent Labs    12/08/19 0512 12/09/19 0241 12/12/19 0506  CKTOTAL 1,102* 674* 144   BNP: Invalid input(s): POCBNP Lab Results  Component Value Date   HGBA1C 5.1 12/10/2019   Lab Results  Component Value Date   TSH 5.783 (H) 12/09/2019   Lab Results  Component Value Date   VITAMINB12 301 12/09/2019   Lab Results  Component Value Date   FOLATE 10.6 12/09/2019   Lab Results  Component Value Date   IRON 40 12/09/2019   TIBC 260 12/09/2019   FERRITIN 131 12/09/2019    Imaging and Procedures obtained prior to SNF admission: CT HEAD WO CONTRAST  Result Date: 12/07/2019 CLINICAL DATA:  Fall  EXAM: CT HEAD WITHOUT CONTRAST TECHNIQUE: Contiguous axial images were obtained from the base of the skull through the vertex without intravenous contrast. COMPARISON:  None. FINDINGS: Brain: There is atrophy and chronic small vessel disease changes. No acute intracranial abnormality. Specifically, no hemorrhage, hydrocephalus, mass lesion, acute infarction, or significant intracranial injury. Vascular: No hyperdense vessel or unexpected calcification. Skull: No acute calvarial abnormality. Sinuses/Orbits: Visualized paranasal sinuses and mastoids clear. Orbital soft tissues unremarkable. Other: None IMPRESSION: Atrophy, chronic microvascular disease. No acute intracranial abnormality. Electronically Signed   By: Rolm Baptise M.D.   On: 12/07/2019 10:17   CT CHEST W  CONTRAST  Result Date: 12/07/2019 CLINICAL DATA:  Status post fall. EXAM: CT CHEST, ABDOMEN, AND PELVIS WITH CONTRAST TECHNIQUE: Multidetector CT imaging of the chest, abdomen and pelvis was performed following the standard protocol during bolus administration of intravenous contrast. CONTRAST:  134mL OMNIPAQUE IOHEXOL 300 MG/ML  SOLN COMPARISON:  None. FINDINGS: CT CHEST FINDINGS Cardiovascular: Atherosclerosis of thoracic aorta is noted without aneurysm or dissection. Normal cardiac size. No pericardial effusion is noted. Mediastinum/Nodes: Moderate size sliding-type hiatal hernia is noted. Thyroid gland is unremarkable. No adenopathy is noted. Lungs/Pleura: Lungs are clear. No pleural effusion or pneumothorax. Musculoskeletal: Severely comminuted and displaced proximal right humeral head and neck fracture is noted. CT ABDOMEN PELVIS FINDINGS Hepatobiliary: No gallstones or biliary dilatation is noted. 1.6 x 1.1 cm enhancing abnormality is noted in dome of right hepatic lobe which appears to have both supplying portal branch and draining hepatic vein, suggesting portosystemic shunt. Pancreas: Unremarkable. No pancreatic ductal dilatation or  surrounding inflammatory changes. Spleen: Normal in size without focal abnormality. Adrenals/Urinary Tract: Adrenal glands are unremarkable. Kidneys are normal, without renal calculi, focal lesion, or hydronephrosis. Bladder is unremarkable. Stomach/Bowel: There is no evidence of bowel obstruction or inflammation. Diverticulosis is noted throughout the colon without inflammation. Status post appendectomy. Vascular/Lymphatic: Aortic atherosclerosis. No enlarged abdominal or pelvic lymph nodes. Reproductive: Uterus and bilateral adnexa are unremarkable. Other: No abdominal wall hernia or abnormality. No abdominopelvic ascites. Musculoskeletal: No acute or significant osseous findings. IMPRESSION: 1. Severely comminuted and displaced proximal right humeral head and neck fracture is noted. 2. Moderate size sliding-type hiatal hernia. 3. 1.6 x 1.1 cm enhancing abnormality is noted in dome of right hepatic lobe which appears to have both supplying portal branch and draining hepatic vein, suggesting portosystemic shunt. 4. Diverticulosis is noted throughout the colon without inflammation. Aortic Atherosclerosis (ICD10-I70.0). Electronically Signed   By: Marijo Conception M.D.   On: 12/07/2019 10:35   CT CERVICAL SPINE WO CONTRAST  Result Date: 12/07/2019 CLINICAL DATA:  Fall, neck trauma EXAM: CT CERVICAL SPINE WITHOUT CONTRAST TECHNIQUE: Multidetector CT imaging of the cervical spine was performed without intravenous contrast. Multiplanar CT image reconstructions were also generated. COMPARISON:  None. FINDINGS: Alignment: Normal Skull base and vertebrae: No acute fracture. No primary bone lesion or focal pathologic process. Soft tissues and spinal canal: No prevertebral fluid or swelling. No visible canal hematoma. Disc levels: Diffuse degenerative facet disease bilaterally. Disc spaces maintained. Upper chest: No acute findings Other: None IMPRESSION: Diffuse degenerative facet disease.  No acute bony abnormality.  Electronically Signed   By: Rolm Baptise M.D.   On: 12/07/2019 10:19   CT ABDOMEN PELVIS W CONTRAST  Result Date: 12/07/2019 CLINICAL DATA:  Status post fall. EXAM: CT CHEST, ABDOMEN, AND PELVIS WITH CONTRAST TECHNIQUE: Multidetector CT imaging of the chest, abdomen and pelvis was performed following the standard protocol during bolus administration of intravenous contrast. CONTRAST:  142mL OMNIPAQUE IOHEXOL 300 MG/ML  SOLN COMPARISON:  None. FINDINGS: CT CHEST FINDINGS Cardiovascular: Atherosclerosis of thoracic aorta is noted without aneurysm or dissection. Normal cardiac size. No pericardial effusion is noted. Mediastinum/Nodes: Moderate size sliding-type hiatal hernia is noted. Thyroid gland is unremarkable. No adenopathy is noted. Lungs/Pleura: Lungs are clear. No pleural effusion or pneumothorax. Musculoskeletal: Severely comminuted and displaced proximal right humeral head and neck fracture is noted. CT ABDOMEN PELVIS FINDINGS Hepatobiliary: No gallstones or biliary dilatation is noted. 1.6 x 1.1 cm enhancing abnormality is noted in dome of right hepatic lobe which appears to have both supplying portal  branch and draining hepatic vein, suggesting portosystemic shunt. Pancreas: Unremarkable. No pancreatic ductal dilatation or surrounding inflammatory changes. Spleen: Normal in size without focal abnormality. Adrenals/Urinary Tract: Adrenal glands are unremarkable. Kidneys are normal, without renal calculi, focal lesion, or hydronephrosis. Bladder is unremarkable. Stomach/Bowel: There is no evidence of bowel obstruction or inflammation. Diverticulosis is noted throughout the colon without inflammation. Status post appendectomy. Vascular/Lymphatic: Aortic atherosclerosis. No enlarged abdominal or pelvic lymph nodes. Reproductive: Uterus and bilateral adnexa are unremarkable. Other: No abdominal wall hernia or abnormality. No abdominopelvic ascites. Musculoskeletal: No acute or significant osseous findings.  IMPRESSION: 1. Severely comminuted and displaced proximal right humeral head and neck fracture is noted. 2. Moderate size sliding-type hiatal hernia. 3. 1.6 x 1.1 cm enhancing abnormality is noted in dome of right hepatic lobe which appears to have both supplying portal branch and draining hepatic vein, suggesting portosystemic shunt. 4. Diverticulosis is noted throughout the colon without inflammation. Aortic Atherosclerosis (ICD10-I70.0). Electronically Signed   By: Marijo Conception M.D.   On: 12/07/2019 10:35   DG Pelvis Portable  Result Date: 12/07/2019 CLINICAL DATA:  Recent fall. EXAM: PORTABLE PELVIS 1-2 VIEWS COMPARISON:  None. FINDINGS: Negative for pelvic fracture. Mild degenerative change left hip with joint space narrowing and spurring. No acute bony lesion. IMPRESSION: No acute abnormality pain Electronically Signed   By: Franchot Gallo M.D.   On: 12/07/2019 09:11   DG Chest Port 1 View  Result Date: 12/07/2019 CLINICAL DATA:  Fall several days ago. EXAM: PORTABLE CHEST 1 VIEW COMPARISON:  None. FINDINGS: Lungs are well aerated. No infiltrate effusion or edema. Heart size upper normal. Comminuted fracture proximal right humerus. Multiple chronic right rib fractures. IMPRESSION: No acute cardiopulmonary abnormality Comminuted fracture proximal right humerus. Electronically Signed   By: Franchot Gallo M.D.   On: 12/07/2019 09:10   DG Shoulder Right Port  Result Date: 12/07/2019 CLINICAL DATA:  Recent fall. EXAM: PORTABLE RIGHT SHOULDER COMPARISON:  10/02/2013 FINDINGS: Comminuted fracture right humeral neck and humeral head. Ventral angulation. Multiple comminuted bone fragments. AC joint intact Chronic right rib fractures. IMPRESSION: Comminuted and angulated fracture proximal right humerus. Electronically Signed   By: Franchot Gallo M.D.   On: 12/07/2019 09:12   DG Foot Complete Left  Result Date: 12/07/2019 CLINICAL DATA:  Left foot pain. Fell 2 days ago. EXAM: LEFT FOOT - COMPLETE 3+  VIEW COMPARISON:  None. FINDINGS: Moderate age related degenerative changes. No acute fracture is identified. Large calcaneal heel spur noted. IMPRESSION: Age related degenerative changes but no acute fracture. Electronically Signed   By: Marijo Sanes M.D.   On: 12/07/2019 09:13   CT MAXILLOFACIAL WO CONTRAST  Result Date: 12/07/2019 CLINICAL DATA:  Fall, left facial bruising EXAM: CT MAXILLOFACIAL WITHOUT CONTRAST TECHNIQUE: Multidetector CT imaging of the maxillofacial structures was performed. Multiplanar CT image reconstructions were also generated. COMPARISON:  None. FINDINGS: Osseous: No fracture or mandibular dislocation. No destructive process. Orbits: Negative. No traumatic or inflammatory finding. Sinuses: Clear Soft tissues: Negative Limited intracranial: No acute findings IMPRESSION: No facial or orbital fracture. Electronically Signed   By: Rolm Baptise M.D.   On: 12/07/2019 10:18    Assessment/Plan 1. Cerebral infarction due to embolism of cerebral artery (HCC) -with hemorrhagic conversion -here for rehab due to aphasia and right arm fx sustained when this occurred -working with PT, OT, ST -is getting up without assistance and keeps taking off sling per nursing, not eating well  2. Aphasia as late effect of cerebrovascular accident (CVA) -continue  ST for this and r/o any dysphagia  3. Uncomplicated alcohol dependence ([place]) -had some DTs at hospital it sounds like but should be recovered now from that though still at risk for delirium at her age   59. Traumatic rhabdomyolysis, initial encounter ([place]) -CK and cr trended down, recovered from this  5. Fracture of humeral head, closed, right, initial encounter -f/u with ortho as planned--arrangements already in place to f/u with group who saw her in hospital   6. Chronic combined systolic (congestive) and diastolic (congestive) heart failure (HCC) -continue coreg, not on diuretics at this time, monitor weight, breathing, sats,  edema  7. Anemia, unspecified type -f/u cbc, is on folic acid and thiamine with alcoholism, is on baby asa only due to recent hemorrhagic conversion of portion of infarct  8. Adult failure to thrive -not doing well so far with eating and drinking here and had a period of time at home without po intake and sounds like she had some baseline dementia from hospital record -palliative care has been consulted to assist with goals of care discussion with family, she does have DNR code status  9. DNR (do not resuscitate) - Do not attempt resuscitation (DNR)  Family/ staff Communication: discussed with snf nurse  Labs/tests ordered:   Cbc, bmp, f/u with ortho and neurology as per d/c summary recommendations  Zhuri Krass L. Shundra Wirsing, D.O. [facility] [address], [place] [address] Cell Phone (Mon-Fri 8am-5pm):  [phone] On Call:  [phone] & follow prompts after 5pm & weekends Office Phone:  [phone] Office Fax:  [phone]

## 2019-12-18 NOTE — Progress Notes (Signed)
CARDIOLOGY OFFICE NOTE  Date:  12/20/2019    Jennifer Rocha Date of Birth: August 17, 1927 Medical Record #[id number]  PCP:  Jennifer Bowen, MD  Cardiologist:  Jennifer Rocha  Chief Complaint  Patient presents with  . Follow-up    Seen for Dr. Tamala Rocha    History of Present Illness: Jennifer Rocha is a 86 y.o. female who presents today for a post hospital visit. Seen for Dr. Tamala Rocha.   She has a history of HLD, HTN, NICM with recover of EF to 54% by prior Myoview.   She was last seen by Dr. Tamala Rocha in June of 2020 - she was felt to be doing well from cardiac standpoint.   Presented earlier this month to the hospital with acute ischemic stroke felt to be cardioembolic but also with SAH noted in the right frontal lobe - not a candidate for anticoagulation. Echo with EF of 55 to 60%. She was to see neurology 4 weeks post hospital for discussion of embolic work up and candidacy of anticoagulation. There was noted alcohol withdrawal. She has had a fall with associated rhabdomyolysis due to prolonged down time with subsequent humerus fracture - hoping to avoid surgery but noted refusal for wearing sling.  Discharged to SNF. Noted DNR status.   Comes in today. Here with her son Jennifer Rocha. He is not sure why they are here today. Have seen no other provider since transfer into [facility]. He gives the history. Not able to get orthopedic visit. Seen by Dr. Marcelino Rocha in the hospital. Trying to get into [facility] instead due to bad experience with this last admission. She is to be in a sling - she is not wearing - pulling it off during our visit. Unclear if family knows about the alcohol/withdrawal. Probably not going to be able to go back home and live independently. They do not have neurology follow up yet. She denies chest pain. Breathing seems ok.    Past Medical History:  Diagnosis Date  . Alcohol dependence ([place])   . HLD (hyperlipidemia)   . Hypertension   . Left heart failure ([place])   . Non-ischemic  cardiomyopathy ([place])    with EF 20% improving to 50%    Past Surgical History:  Procedure Laterality Date  . ANKLE SURGERY Right   . APPENDECTOMY  1941  . TONSILLECTOMY       Medications: Current Meds  Medication Sig  . aspirin EC 81 MG tablet Take 81 mg by mouth daily.  Marland Kitchen atorvastatin (LIPITOR) 40 MG tablet Take 1 tablet by mouth daily.  . carvedilol (COREG) 6.25 MG tablet Take 1 tablet by mouth 2 (two) times daily.   . Cholecalciferol (VITAMIN D3) 50000 units CAPS Take 50,000 Units by mouth See admin instructions. Every other week.  . cloNIDine (CATAPRES - DOSED IN MG/24 HR) 0.2 mg/24hr patch Place 1 patch (0.2 mg total) onto the skin once a week.  . DULoxetine (CYMBALTA) 30 MG capsule Take 1 capsule by mouth daily.   . feeding supplement, ENSURE ENLIVE, (ENSURE ENLIVE) LIQD Take 237 mLs by mouth 3 (three) times daily between meals.  . folic acid (FOLVITE) 1 MG tablet Take 1 tablet (1 mg total) by mouth daily.  . Multiple Vitamins-Minerals (ICAPS MV) TABS Take 2 tablets by mouth at bedtime.   . Polysaccharide Iron Complex (POLY-IRON 150 PO) Take 1 tablet by mouth 2 (two) times daily.  Marland Kitchen thiamine 100 MG tablet Take 1 tablet (100 mg total) by  mouth daily.  . traMADol (ULTRAM) 50 MG tablet Take 0.5 tablets (25 mg total) by mouth every 6 (six) hours as needed for severe pain.     Allergies: Allergies  Allergen Reactions  . Demerol [Meperidine] Nausea And Vomiting  . Novocain [Procaine]     Social History: The patient  reports that she quit smoking about 66 years ago. She has never used smokeless tobacco. She reports current alcohol use of about 21.0 standard drinks of alcohol per week. She reports previous drug use.   Family History: [name] family history includes Alcohol abuse in an other family member; Heart disease in her brother; Heart failure in her mother. She was adopted.   Review of Systems: Please see the history of present illness.   All other systems are  reviewed and negative.   Physical Exam: VS:  BP (!) 126/60   Pulse 83   Wt 135 lb 3.2 oz (61.3 kg)   SpO2 96%   BMI 23.95 kg/m  .  BMI Body mass index is 23.95 kg/m.  Wt Readings from Last 3 Encounters:  12/20/19 135 lb 3.2 oz (61.3 kg)  12/17/19 143 lb 4.8 oz (65 kg)  12/07/19 143 lb 4.8 oz (65 kg)    General: Elderly. Alert and in no acute distress. She has expressive aphasia intermittently.  Looks pale.  Cardiac: Regular rate and rhythm. No murmurs, rubs, or gallops. No edema.  Respiratory:  Lungs are clear to auscultation bilaterally with normal work of breathing.  GI: Soft and nontender.  MS: No deformity or atrophy. Gait not tested.  Skin: Warm and dry. Color is normal.  Neuro:  Strength and sensation are intact and no gross focal deficits noted.  Psych: Alert, appropriate and with normal affect. Significant bruising over the right shoulder/chest - pulling sling off.    LABORATORY DATA:  EKG:  EKG is not ordered today.   Lab Results  Component Value Date   WBC 11.0 (H) 12/14/2019   HGB 10.6 (L) 12/14/2019   HCT 33.1 (L) 12/14/2019   PLT 312 12/14/2019   GLUCOSE 126 (H) 12/14/2019   CHOL 132 12/10/2019   TRIG 86 12/10/2019   HDL 38 (L) 12/10/2019   LDLCALC 77 12/10/2019   ALT 24 12/14/2019   AST 27 12/14/2019   NA 138 12/14/2019   K 3.9 12/14/2019   CL 102 12/14/2019   CREATININE 0.38 (L) 12/14/2019   BUN 7 (L) 12/14/2019   CO2 27 12/14/2019   TSH 5.783 (H) 12/09/2019   INR 1.0 12/07/2019   HGBA1C 5.1 12/10/2019     BNP (last 3 results) No results for input(s): BNP in the last 8760 hours.  ProBNP (last 3 results) No results for input(s): PROBNP in the last 8760 hours.   Other Studies Reviewed Today:  ECHO IMPRESSIONS 11/2019  1. Technically difficult study. Left ventricular ejection fraction, by  estimation, is 55 to 60%. The left ventricle has normal function. The left  ventricle has no regional wall motion abnormalities. There is mild left    ventricular hypertrophy. Left  ventricular diastolic parameters are indeterminate.  2. Right ventricular systolic function is normal. The right ventricular  size is normal. Tricuspid regurgitation signal is inadequate for assessing  PA pressure.  3. The mitral valve is normal in structure. Trivial mitral valve  regurgitation.  4. The aortic valve was not well visualized. Aortic valve regurgitation  is not visualized. No aortic stenosis is present.  5. The inferior vena cava is dilated  in size with <50% respiratory  variability, suggesting right atrial pressure of 15 mmHg.    ASSESSMENT & PLAN:   1.  Recent fall with humerus fracture. Doubt will need surgery - not really wearing her sling - does not seem to understand due to other events - would favor conservative measures - will try to facilitate orthopedic appointment for them. She has seen someone at [place] in the remote past.    2. Recent ischemic strokes - extensive L MCA distribution and right frontal lob - complicated by [facility] - she is not a candidate for anticoagulation from our standpoint despite the strokes being most likely cardioembolic. She has had numerous falling prior to this recent event - she has had alcohol abuse - she is not a candidate for anticoagulation - we will not proceed with work up looking for AF. Discussed with Dr. Tamala Rocha this afternoon in the office who is in agreement with this plan. Trying to get to neurology for follow up.   3. Prior alcohol withdrawal/dependence.  4. Metabolic encephalopathy - this neuro status may be her new baseline.    5. Prior traumatic rhabdomyolysis   6. Anemia - did require prior transfusion.   7. HTN - BP is fine on current regimen.   8. NICM with chronic combined systolic and diastolic CHF  9. HLD - would favor stopping statin given circumstances, age and DNR status.   10. DNR - does not appear that she will be able to return to independent living given her  current neurologic status in my opinion. Conservative care felt to be in her best interest.   Current medicines are reviewed with the patient today.  The patient does not have concerns regarding medicines other than what has been noted above.  The following changes have been made:  See above.  Labs/ tests ordered today include:   No orders of the defined types were placed in this encounter.    Disposition:   FU with [place] as needed.   Patient is agreeable to this plan and will call if any problems develop in the interim.   SignedTruitt Merle, NP  12/20/2019 4:33 PM  [facility] [address] [place], [place]  [address] Phone: [phone] Fax: [phone]

## 2019-12-20 ENCOUNTER — Ambulatory Visit (INDEPENDENT_AMBULATORY_CARE_PROVIDER_SITE_OTHER): Payer: Medicare Other | Admitting: Nurse Practitioner

## 2019-12-20 ENCOUNTER — Encounter: Payer: Self-pay | Admitting: Nurse Practitioner

## 2019-12-20 ENCOUNTER — Other Ambulatory Visit: Payer: Self-pay

## 2019-12-20 ENCOUNTER — Other Ambulatory Visit: Payer: Self-pay | Admitting: *Deleted

## 2019-12-20 VITALS — BP 126/60 | HR 83 | Wt 135.2 lb

## 2019-12-20 DIAGNOSIS — I428 Other cardiomyopathies: Secondary | ICD-10-CM

## 2019-12-20 DIAGNOSIS — Z8673 Personal history of transient ischemic attack (TIA), and cerebral infarction without residual deficits: Secondary | ICD-10-CM

## 2019-12-20 DIAGNOSIS — Z8781 Personal history of (healed) traumatic fracture: Secondary | ICD-10-CM | POA: Diagnosis not present

## 2019-12-20 DIAGNOSIS — I609 Nontraumatic subarachnoid hemorrhage, unspecified: Secondary | ICD-10-CM | POA: Diagnosis not present

## 2019-12-20 DIAGNOSIS — I5032 Chronic diastolic (congestive) heart failure: Secondary | ICD-10-CM

## 2019-12-20 DIAGNOSIS — I6340 Cerebral infarction due to embolism of unspecified cerebral artery: Secondary | ICD-10-CM | POA: Diagnosis not present

## 2019-12-20 DIAGNOSIS — I10 Essential (primary) hypertension: Secondary | ICD-10-CM

## 2019-12-20 NOTE — Patient Instructions (Addendum)
After Visit Summary:  We will be checking the following labs today - NONE   Medication Instructions:    Continue with your current medicines.    If you need a refill on your cardiac medications before your next appointment, please call your pharmacy.     Testing/Procedures To Be Arranged:  N/A  Follow-Up:   See [place] as needed.   We will get you neurology and orthopedic follow up.     At [facility], you and your health needs are our priority.  As part of our continuing mission to provide you with exceptional heart care, we have created designated Provider Care Teams.  These Care Teams include your primary Cardiologist (physician) and Advanced Practice Providers (APPs -  Physician Assistants and Nurse Practitioners) who all work together to provide you with the care you need, when you need it.  Special Instructions:  . Stay safe, wash your hands for at least 20 seconds and wear a mask when needed.  . It was good to talk with you today.    Call the [place] office at [phone] if you have any questions, problems or concerns.

## 2019-12-21 ENCOUNTER — Other Ambulatory Visit: Payer: Self-pay | Admitting: *Deleted

## 2019-12-21 ENCOUNTER — Telehealth: Payer: Self-pay | Admitting: Nurse Practitioner

## 2019-12-21 NOTE — Telephone Encounter (Signed)
LM for pts daughter re: below message:   Andrena, [place]  [phone]  Will forward message  to Truitt Merle NP for her review.    Patient's daughter, [name], is calling to get a referral for a neurologist, they are not sure of who else to go to and want to know if [name] can recommend someone good. [name] states that they wanted to go see Dr. [name] but they were advised he is not taking new patient. Dr. [name] had the first availability which is 9/22. [name] states that they don't want to have to wait that long to get to be seen. Please advise.

## 2019-12-21 NOTE — Telephone Encounter (Signed)
Pts daughter advised Lori's recommendation.  After talking with our Referral coordinator... pt advised that we have provided the needed information to [facility] for the pts referral but they have noted that the pt was too complicated to be followed by their Nurse Practitioner. Pts daughter advised that she may need to call them to report that they are wanting a sooner appt than 02/16/20. She agrees and says she will call their office in the morning to see if they can make her a sooner appt.

## 2019-12-21 NOTE — Telephone Encounter (Signed)
It is for a post hospital visit - seen by Dr. Erlinda Hong then - but has referral in for Dr. Leonie Man.  She could see the NP - Janett Billow in the interim.   Does this help??  [name]

## 2019-12-21 NOTE — Patient Outreach (Signed)
Member screened for potential [facility] Care Management needs as a benefit of [place] Medicare.  Verified in Patient Jennifer Rocha member is receiving skilled therapy at [facility].   Spoke with [employer] SW department to confirm palliative care referral has been made per hospital dc summary. Will follow up with [facility] to confirm receipt.   Per hospital H&P, member lived alone and appeared to be quite independent.   Will continue to follow for transition plan and potential [facility] Care Management needs. Will plan outreach to family as appropriate.   Mrs. Krempasky has a medical history of of chronic combined CHF; HTN; and HLD.    Marthenia Rolling, MSN-Ed, RN,BSN [place] Acute Care Coordinator [phone] [facility]) [phone]  (Toll free office)

## 2019-12-21 NOTE — Telephone Encounter (Signed)
Patient's daughter, [name], is calling to get a referral for a neurologist, they are not sure of who else to go to and want to know if [name] can recommend someone good. [name] states that they wanted to go see Dr. [name] but they were advised he is not taking new patient. Dr. [name] had the first availability which is 9/22. [name] states that they don't want to have to wait that long to get to be seen. Please advise.

## 2019-12-21 NOTE — Telephone Encounter (Signed)
Follow Up  [name] returning call. Please give her a call back.

## 2019-12-22 LAB — COMPREHENSIVE METABOLIC PANEL
Calcium: 9.2 (ref 8.7–10.7)
GFR calc Af Amer: >90
GFR calc non Af Amer: >90

## 2019-12-22 LAB — BASIC METABOLIC PANEL
BUN: 5 (ref 4–21)
CO2: 29 — AB (ref 13–22)
Chloride: 100 (ref 99–108)
Creatinine: 0.2 — AB (ref 0.5–1.1)
Glucose: 107
Potassium: 4 (ref 3.4–5.3)
Sodium: 140 (ref 137–147)

## 2019-12-22 LAB — CBC AND DIFFERENTIAL
HCT: 30 — AB (ref 36–46)
Hemoglobin: 10.1 — AB (ref 12.0–16.0)
Platelets: 284 (ref 150–399)
WBC: 4.6

## 2019-12-22 LAB — CBC: RBC: 3.05 — AB (ref 3.87–5.11)

## 2019-12-23 DIAGNOSIS — M25511 Pain in right shoulder: Secondary | ICD-10-CM | POA: Diagnosis not present

## 2019-12-23 DIAGNOSIS — S42231A 3-part fracture of surgical neck of right humerus, initial encounter for closed fracture: Secondary | ICD-10-CM | POA: Diagnosis not present

## 2019-12-29 LAB — CBC AND DIFFERENTIAL
HCT: 33 — AB (ref 36–46)
Hemoglobin: 11.1 — AB (ref 12.0–16.0)
Platelets: 284 (ref 150–399)
WBC: 3.4

## 2019-12-29 LAB — CBC: RBC: 3.33 — AB (ref 3.87–5.11)

## 2019-12-31 ENCOUNTER — Other Ambulatory Visit: Payer: Self-pay | Admitting: *Deleted

## 2019-12-31 NOTE — Patient Outreach (Signed)
[facility] Post- Acute Care Coordinator follow up. Member screened for potential [facility] Care Management needs as a benefit of [place] Medicare.  Mrs. [place] is receiving skilled therapy at [facility]. Update received from SNF SW indicates family is looking into placement. However, unsure if member will be agreeable. Transition plans are pending.  Will continue to follow for potential [facility] Care Management needs while member resides in SNF.    Marthenia Rolling, MSN-Ed, RN,BSN [place] Acute Care Coordinator [phone] [facility]) [phone]  (Toll free office)

## 2020-01-03 ENCOUNTER — Ambulatory Visit: Payer: Medicare Other | Admitting: Interventional Cardiology

## 2020-01-06 DIAGNOSIS — S42201D Unspecified fracture of upper end of right humerus, subsequent encounter for fracture with routine healing: Secondary | ICD-10-CM | POA: Diagnosis not present

## 2020-01-08 DIAGNOSIS — R627 Adult failure to thrive: Secondary | ICD-10-CM

## 2020-01-11 ENCOUNTER — Other Ambulatory Visit: Payer: Self-pay | Admitting: *Deleted

## 2020-01-11 NOTE — Patient Outreach (Signed)
THN Post- Acute Care Coordinator follow up. Member screened for potential [facility] Care Management needs as a benefit of [place] Medicare.  Jennifer Rocha is receiving skilled therapy at [facility]. Facility SW indicates member's family is looking into [facility]. States therapy continues to work with member due to removal of NWB status of right arm.   Will continue to follow for transition plans while member resides in SNF.    Marthenia Rolling, MSN-Ed, RN,BSN [place] Acute Care Coordinator [phone] [facility]) [phone]  (Toll free office)

## 2020-01-17 ENCOUNTER — Other Ambulatory Visit: Payer: Self-pay | Admitting: *Deleted

## 2020-01-17 NOTE — Patient Outreach (Signed)
THN Post- Acute Care Coordinator follow up. Member screened for potential [facility] Care Management needs as a benefit of [place] Medicare.  Per Patient Pear[place]tha [place] member resides in [place].   Communication sent to facility SW to request update on transition plans.  Will continue follow.  Marthenia Rolling, MSN-Ed, RN,BSN [place] Acute Care Coordinator [phone] [facility]) [phone]  (Toll free office)

## 2020-01-17 NOTE — Patient Outreach (Signed)
THN Post- Acute Care Coordinator follow up.   Update received from [place] is looking into [facility]. However, family has appealed SNF dc.   Will continue to follow for transition plans.    Marthenia Rolling, MSN-Ed, RN,BSN [place] Acute Care Coordinator [phone] [facility]) [phone]  (Toll free office)

## 2020-01-24 ENCOUNTER — Non-Acute Institutional Stay (SKILLED_NURSING_FACILITY): Payer: Medicare Other | Admitting: Family

## 2020-01-24 ENCOUNTER — Encounter: Payer: Self-pay | Admitting: Family

## 2020-01-24 DIAGNOSIS — R627 Adult failure to thrive: Secondary | ICD-10-CM | POA: Diagnosis not present

## 2020-01-24 DIAGNOSIS — S42291D Other displaced fracture of upper end of right humerus, subsequent encounter for fracture with routine healing: Secondary | ICD-10-CM

## 2020-01-24 DIAGNOSIS — I69320 Aphasia following cerebral infarction: Secondary | ICD-10-CM

## 2020-01-24 DIAGNOSIS — E785 Hyperlipidemia, unspecified: Secondary | ICD-10-CM

## 2020-01-24 DIAGNOSIS — D649 Anemia, unspecified: Secondary | ICD-10-CM | POA: Diagnosis not present

## 2020-01-24 DIAGNOSIS — I5042 Chronic combined systolic (congestive) and diastolic (congestive) heart failure: Secondary | ICD-10-CM

## 2020-01-24 DIAGNOSIS — J069 Acute upper respiratory infection, unspecified: Secondary | ICD-10-CM | POA: Diagnosis not present

## 2020-01-24 NOTE — Progress Notes (Signed)
Location:    [place].   Nursing Home Room Number: 103-P Place of Service:  SNF (31) Provider:  Marlowe Sax, NP    Patient Care Team: Reynold Bowen, MD as PCP - General (Endocrinology) Belva Crome, MD as PCP - Cardiology (Cardiology)  Extended Emergency Contact Information Primary Emergency Contact: Alveria Apley of [place] Phone: [phone] Relation: Son Secondary Emergency Contact: Vanderslice-Johnston,[name]          [place], [place] Phone: [phone] Relation: Daughter  Code Status:  DNR Goals of care: Advanced Directive information Advanced Directives 01/24/2020  Does Patient Have a Medical Advance Directive? Yes  Type of Advance Directive Out of facility DNR (pink MOST or yellow form)  Does patient want to make changes to medical advance directive? No - Patient declined     Chief Complaint  Patient presents with  . Medical Management of Chronic Issues    Routine Visit.   Marland Kitchen Health Maintenance    Discuss the need for Dexa Scan.  . Immunizations    Discuss the need for Influenza Vaccine, and PNA Vaccine.     HPI:  Pt is a 86 y.o. female seen today for medical management of chronic diseases. She has medical history of Hypertension,Combined chronic congestive heart Failure EF 55 - 60 %, alcohol dependence,Memory loss among other condition.she is here at [facility] post hospital admission 12/07/2019 - 12/15/2019 post fall at home sustaining right humeral neck fracture and mild rhabdomyolysis with CK of 2943.Dr.Handy recommended splint with outpatient follow up.Her MRI also showed extensive left MCA distribution and right front lobe subarachnoid hemorrhage felt due to cardioembolic but was not a candidate for anticoagulation.she had follow up with Cardiology at [place] 12/20/2019 seen by Kathrene Alu no changes in meds.recommended patient to follow up as needed.Per chart review also  awaiting Neurology follow up post hospital admission.social worker indicates POA in the process of looking for ALF  She denies any acute issues during visit.Nurse states requires one assist with her ADL's,walkers with a walker and able to feed self.  Palliative care following up. Her weight log reviewed weight loss noted though some weight irregularity noted. Weight 134 lbs ( 01/17/2020) Weight 135 lbs ( 01/19/2020) Weight 133.8 lbs ( 01/21/2020).she is on Medpas three times between meals.states appetite is good. She continue to follow up with facility Nutritionist.  No recent fall episode reported.  Her blood pressure log reviewed readings in the 130's/70's - 150/70   Past Medical History:  Diagnosis Date  . Alcohol dependence ([place])   . HLD (hyperlipidemia)   . Hypertension   . Left heart failure ([place])   . Non-ischemic cardiomyopathy ([place])    with EF 20% improving to 50%   Past Surgical History:  Procedure Laterality Date  . ANKLE SURGERY Right   . APPENDECTOMY  1941  . TONSILLECTOMY      Allergies  Allergen Reactions  . Demerol [Meperidine] Nausea And Vomiting  . Novocain [Procaine]     Allergies as of 01/24/2020      Reactions   Demerol [meperidine] Nausea And Vomiting   Novocain [procaine]       Medication List       Accurate as of January 24, 2020  9:45 AM. If you have any questions, ask your nurse or doctor.        STOP taking these medications   feeding supplement (ENSURE ENLIVE) Liqd Stopped by: Sandrea Hughs,  NP     TAKE these medications   acetaminophen 325 MG tablet Commonly known as: TYLENOL Take 650 mg by mouth every 6 (six) hours as needed.   aspirin EC 81 MG tablet Take 81 mg by mouth daily.   atorvastatin 40 MG tablet Commonly known as: LIPITOR Take 1 tablet by mouth daily.   bisacodyl 10 MG suppository Commonly known as: DULCOLAX Place 10 mg rectally as needed for moderate constipation.   carvedilol 6.25 MG tablet Commonly known as:  COREG Take 1 tablet by mouth 2 (two) times daily.   cloNIDine 0.2 mg/24hr patch Commonly known as: CATAPRES - Dosed in mg/24 hr Place 1 patch (0.2 mg total) onto the skin once a week.   DULoxetine 30 MG capsule Commonly known as: CYMBALTA Take 1 capsule by mouth daily.   folic acid 1 MG tablet Commonly known as: FOLVITE Take 1 tablet (1 mg total) by mouth daily.   ICaps MV Tabs Take 2 tablets by mouth at bedtime.   MILK OF MAGNESIA PO Take 30 mLs by mouth as needed.   POLY-IRON 150 PO Take 1 tablet by mouth 2 (two) times daily.   RA SALINE ENEMA RE Place rectally as needed.   thiamine 100 MG tablet Take 1 tablet (100 mg total) by mouth daily.   traMADol 50 MG tablet Commonly known as: ULTRAM Take 0.5 tablets (25 mg total) by mouth every 6 (six) hours as needed for severe pain.   Vitamin D3 1.25 MG (50000 UT) Caps Take 50,000 Units by mouth See admin instructions. Every other week.       Review of Systems  Constitutional: Negative for appetite change, chills, fatigue and fever.  HENT: Negative for congestion, hearing loss, rhinorrhea, sinus pressure, sinus pain, sneezing, sore throat and trouble swallowing.   Eyes: Negative for discharge, redness and itching.       Wears eye glasses  Respiratory: Negative for cough, chest tightness, shortness of breath and wheezing.   Cardiovascular: Positive for leg swelling. Negative for chest pain and palpitations.  Gastrointestinal: Negative for abdominal distention, abdominal pain, constipation, diarrhea, nausea and vomiting.  Endocrine: Negative for cold intolerance, heat intolerance, polydipsia, polyphagia and polyuria.  Genitourinary: Negative for difficulty urinating, dysuria, flank pain, frequency and urgency.  Musculoskeletal: Positive for arthralgias and gait problem.  Skin: Negative for color change, pallor and rash.  Neurological: Negative for dizziness, seizures, syncope, weakness, light-headedness and headaches.        Per nurse patient aphasia and dysarthria has improved  Hematological: Does not bruise/bleed easily.  Psychiatric/Behavioral: Negative for agitation, behavioral problems and sleep disturbance. The patient is not nervous/anxious.     Immunization History  Administered Date(s) Administered  . PFIZER SARS-COV-2 Vaccination 06/16/2019, 07/07/2019  . Tdap 12/25/2013   Pertinent  Health Maintenance Due  Topic Date Due  . DEXA SCAN  Never done  . PNA vac Low Risk Adult (1 of 2 - PCV13) Never done  . INFLUENZA VACCINE  12/26/2019   No flowsheet data found. Functional Status Survey:    Vitals:   01/24/20 0923  BP: 140/67  Pulse: 79  Resp: 18  Temp: (!) 96.8 F (36 C)  Weight: 133 lb 12.8 oz (60.7 kg)  Height: 5\' 3"  (1.6 m)   Body mass index is 23.7 kg/m. Physical Exam Vitals and nursing note reviewed.  Constitutional:      General: She is not in acute distress.    Appearance: She is not ill-appearing.  HENT:  Head: Normocephalic.     Nose: No congestion or rhinorrhea.     Mouth/Throat:     Mouth: Mucous membranes are moist.     Pharynx: Oropharynx is clear. No oropharyngeal exudate or posterior oropharyngeal erythema.  Eyes:     General: No scleral icterus.       Right eye: No discharge.        Left eye: No discharge.     Conjunctiva/sclera: Conjunctivae normal.     Pupils: Pupils are equal, round, and reactive to light.     Comments: Corrective lens in place   Neck:     Vascular: No carotid bruit.  Cardiovascular:     Rate and Rhythm: Normal rate and regular rhythm.     Pulses: Normal pulses.     Heart sounds: Normal heart sounds. No murmur heard.  No friction rub. No gallop.   Pulmonary:     Effort: Pulmonary effort is normal. No respiratory distress.     Breath sounds: Normal breath sounds. No wheezing, rhonchi or rales.  Chest:     Chest wall: No tenderness.  Abdominal:     General: Bowel sounds are normal. There is no distension.     Palpations: Abdomen  is soft. There is no mass.     Tenderness: There is no abdominal tenderness. There is no right CVA tenderness, left CVA tenderness, guarding or rebound.  Musculoskeletal:        General: No swelling or tenderness.     Right shoulder: No swelling, bony tenderness or crepitus. Decreased range of motion. Normal strength. Normal pulse.     Left shoulder: Normal.     Cervical back: Normal range of motion. No rigidity or tenderness.     Comments: Unsteady gait walks with walker.right  lower extremity trace edema.   Lymphadenopathy:     Cervical: No cervical adenopathy.  Skin:    General: Skin is warm and dry.     Coloration: Skin is not pale.     Findings: No bruising, erythema or rash.  Neurological:     Mental Status: She is alert. Mental status is at baseline.     Sensory: No sensory deficit.     Coordination: Coordination normal.     Gait: Gait abnormal.  Psychiatric:        Mood and Affect: Mood normal.        Behavior: Behavior normal.        Thought Content: Thought content normal.        Judgment: Judgment normal.     Comments: Aphasia and dysarthria speech though has improved per Nurse     Labs reviewed: Recent Labs    12/08/19 0512 12/09/19 0241 12/12/19 0506 12/12/19 0506 12/13/19 1017 12/14/19 0414 12/22/19 0000  NA 140   < > 137   < > 137 138 140  K 3.0*   < > 3.1*   < > 3.4* 3.9 4.0  CL 105   < > 99   < > 100 102 100  CO2 27   < > 29   < > 28 27 29*  GLUCOSE 104*   < > 109*  --  126* 126*  --   BUN 20   < > 8   < > 7* 7* 5  CREATININE 0.49   < > 0.45   < > 0.41* 0.38* 0.2*  CALCIUM 8.3*   < > 8.8*   < > 9.0 9.2 9.2  MG 2.2  --  1.9  --   --   --   --   PHOS  --   --  3.7  --   --   --   --    < > = values in this interval not displayed.   Recent Labs    12/11/19 0725 12/12/19 0506 12/14/19 0414  AST 28 26 27   ALT 30 26 24   ALKPHOS 61 58 66  BILITOT 2.7* 2.3* 2.1*  PROT 5.4* 5.4* 5.6*  ALBUMIN 2.9* 2.9* 3.0*   Recent Labs    12/12/19 0506  12/12/19 0506 12/13/19 1017 12/13/19 1017 12/14/19 0414 12/22/19 0000 12/29/19 0000  WBC 9.3   < > 10.6*   < > 11.0* 4.6 3.4  HGB 10.0*   < > 10.9*   < > 10.6* 10.1* 11.1*  HCT 30.8*   < > 34.0*   < > 33.1* 30* 33*  MCV 100.0  --  101.2*  --  100.3*  --   --   PLT 303   < > 335   < > 312 284 284   < > = values in this interval not displayed.   Lab Results  Component Value Date   TSH 5.783 (H) 12/09/2019   Lab Results  Component Value Date   HGBA1C 5.1 12/10/2019   Lab Results  Component Value Date   CHOL 132 12/10/2019   HDL 38 (L) 12/10/2019   LDLCALC 77 12/10/2019   TRIG 86 12/10/2019   CHOLHDL 3.5 12/10/2019    Significant Diagnostic Results in last 30 days:  No results found.  Assessment/Plan 1. Closed fracture of head of right humerus with routine healing, subsequent encounter Limited ROM with abduction and rotation. - Pain under control. - continue Therapy  - follow up with Orthopedic   2. Aphasia as late effect of cerebrovascular accident (CVA) Has improved per Nurse.makes needs known. Continue on ASA and Statin. - follow up with Neurology and cardiology   3. Chronic combined systolic (congestive) and diastolic (congestive) heart failure (HCC) No signs of fluid overload. - continue on coreg  - continue to monitor weight   4. Adult failure to thrive Has had 2 ls weight loss over 2 days though weight irregularity noted.Reports good appetite. - continue Medpas three times between meals. - continue with Palliative care.   5. Anemia, unspecified type Hgb has improved 11.1 previous 10.1  Continue to monitor H/H  6. Hyperlipidemia  Latest LDL reviewed . Continue on Atorvastatin 40 mg tablet daily.consider dose reduction if continues to loss weight.   Family/ staff Communication: Reviewed plan of care with patient and facility Nurse   Labs/tests ordered: None

## 2020-02-03 ENCOUNTER — Other Ambulatory Visit: Payer: Self-pay | Admitting: *Deleted

## 2020-02-03 DIAGNOSIS — S42201D Unspecified fracture of upper end of right humerus, subsequent encounter for fracture with routine healing: Secondary | ICD-10-CM | POA: Diagnosis not present

## 2020-02-03 NOTE — Patient Outreach (Signed)
[facility] Post- Acute Care Coordinator follow up. Member screened for potential [facility] Care Management needs as a benefit of [place] Medicare.  Communication sent to [place] to confirm disposition status.   Marthenia Rolling, MSN-Ed, RN,BSN [place] Acute Care Coordinator [phone] [facility]) [phone]  (Toll free office)

## 2020-02-03 NOTE — Patient Outreach (Signed)
[facility] Post- Acute Care Coordinator follow up.   Update received from [facility] indicating member transitioned to [facility]F on 8/21.   No identifiable [facility] Care Management needs at this time.   Marthenia Rolling, MSN-Ed, RN,BSN [place] Acute Care Coordinator [phone] [facility]) [phone]  (Toll free office)

## 2020-02-15 NOTE — Progress Notes (Signed)
[facility]    Provider:  Dr [name] Requesting Provider: Rosalin Hawking, MD Primary Care Provider:  Reynold Bowen, MD  CC:  Hospitalization follow up  HPI:  Jennifer Rocha is a 86 y.o. female here as requested by Dr. Erlinda Hong for follow-up after embolic stroke.  Past medical history alcohol abuse, hypertension, hyperlipidemia, nonischemic cardiomyopathy with left heart failure, embolic stroke in July of this year, history of falls, short-term memory issues.  I reviewed hospitalization notes, she presented on July the 15th of this year for evaluation after a fall, she was actually admitted on the 13th, last time family had spoken to her was 3 days prior, when she did not pick up her paper for 2 days family was alerted and thought she may have had a fall 2 days prior, patient was unable to provide any history, history of falls, family visited her and found her to have difficulty remembering names, she also had some repetition.  She was evaluated at [facility] on the 13th and had a right humeral fracture and mild rhabdo, patient continued to appear confused an MRI of the brain was performed inpatient which showed a left MCA territory subacute infarct as well as a cortical infarct in the right superior frontal lobe, small volume bilateral intraventricular hemorrhage and subarachnoid hemorrhage in the interhemispheric fissure as well as left frontal sylvian fissure and neurology was called to see her on July 15.  Initial neurology exam showed an awake alert patient, appearing to mimic some commands and following simple hand gestures but not simple verbal commands, she could not give a history and was nonverbal, cranial nerves showed diminished blink from threat on the right, difficult motor exam due to right arm injury, but possibly decreased on the right with decreased withdrawal to sensory on the right as well.  NIH stroke scale was 13.  She was initiated on aspirin for secondary stroke prevention when  hemoglobin stabilized, she was continued on atorvastatin 40 mg, anticoagulation was deferred and loop recorder may be considered outpatient if she is a good candidate for anticoagulation,  She is here with son, she doesn't remember anything about the hospital, she went to rehab and she lives at [place] at [facility]. She is doing extremely well. She is in assisted living. The food is good. Her son is here and provides much information. She is doing excellent. Meals are excellent. They have transportation, she plays bingo. She was referred to cardiology on 12/21/2019. She has seen cardiology. She states today she is not falling, she feels very stable, she has not fallen since the hospital. We reviewed all the workup, the strokes, reasons for strokes, Afib and cardioembolic causes, she does not like to use the walker per her son. She has residual lert hand numbness.   Reviewed notes, labs and imaging from outside physicians, which showed: see above  Review of Systems: Patient complains of symptoms per HPI as well as the following symptoms falls Pertinent negatives and positives per HPI. All others negative.   Social History   Socioeconomic History  . Marital status: Widowed    Spouse name: Not on file  . Number of children: 3  . Years of education: 30  . Highest education level: Not on file  Occupational History  . Occupation: Retired   Tobacco Use  . Smoking status: Former Smoker    Quit date: 05/27/1953    Years since quitting: 66.7  . Smokeless tobacco: Never Used  Substance and Sexual Activity  .  Alcohol use: Not Currently    Alcohol/week: 21.0 standard drinks    Types: 21 Glasses of wine per week    Comment: 3 glasses per day  . Drug use: Not Currently    Comment: has abused prescription drugs at times  . Sexual activity: Not on file  Other Topics Concern  . Not on file  Social History Narrative   Resides at [facility] @ [place] [phone]   1-2 cups  coffee per day   Right handed but now uses left due to previous arm fracture that makes it difficult, so uses left most of time   Social Determinants of Health   Financial Resource Strain:   . Difficulty of Paying Living Expenses: Not on file  Food Insecurity:   . Worried About Charity fundraiser in the Last Year: Not on file  . Ran Out of Food in the Last Year: Not on file  Transportation Needs:   . Lack of Transportation (Medical): Not on file  . Lack of Transportation (Non-Medical): Not on file  Physical Activity:   . Days of Exercise per Week: Not on file  . Minutes of Exercise per Session: Not on file  Stress:   . Feeling of Stress : Not on file  Social Connections:   . Frequency of Communication with Friends and Family: Not on file  . Frequency of Social Gatherings with Friends and Family: Not on file  . Attends Religious Services: Not on file  . Active Member of Clubs or Organizations: Not on file  . Attends Archivist Meetings: Not on file  . Marital Status: Not on file  Intimate Partner Violence:   . Fear of Current or Ex-Partner: Not on file  . Emotionally Abused: Not on file  . Physically Abused: Not on file  . Sexually Abused: Not on file    Family History  Adopted: Yes  Problem Relation Age of Onset  . Heart failure Mother   . Heart disease Brother   . Alcohol abuse Other        her daughter, addiction "gallops in our family"    Past Medical History:  Diagnosis Date  . Alcohol dependence ([place])   . Atrial fibrillation ([place])   . Hearing loss   . HLD (hyperlipidemia)   . Hypertension   . Left heart failure ([place])   . Non-ischemic cardiomyopathy ([place])    with EF 20% improving to 50%  . Stroke [facility])    12/09/2019    Patient Active Problem List   Diagnosis Date Noted  . FTT (failure to thrive) in adult   . Adult failure to thrive   . Weakness generalized   . Goals of care, counseling/discussion   . Cerebral embolism with cerebral  infarction 12/10/2019  . Anemia   . Closed fracture of neck of right humerus   . Acute pain of right shoulder   . DNR (do not resuscitate)   . Palliative care by specialist   . Traumatic rhabdomyolysis ([place]) 12/07/2019  . Fracture of humeral head, closed, right, initial encounter 12/07/2019  . Fall at home, initial encounter 12/07/2019  . Alcohol dependence syndrome ([place]) 12/07/2019  . Benign essential tremor 01/27/2015  . Chronic combined systolic (congestive) and diastolic (congestive) heart failure ([place]) 08/23/2013  . 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 Non-ischemic cardiomyopathy ([place])   . Hypertension     Past Surgical History:  Procedure Laterality Date  . ANKLE SURGERY Right   .  APPENDECTOMY  1941  . TONSILLECTOMY      Current Outpatient Medications  Medication Sig Dispense Refill  . acetaminophen (TYLENOL) 325 MG tablet Take 650 mg by mouth every 6 (six) hours as needed.    Marland Kitchen aspirin EC 81 MG tablet Take 81 mg by mouth daily.    Marland Kitchen atorvastatin (LIPITOR) 40 MG tablet Take 2 tablets by mouth daily.     . carvedilol (COREG) 6.25 MG tablet Take 1 tablet by mouth 2 (two) times daily.     . Cholecalciferol (VITAMIN D3) 50000 units CAPS Take 50,000 Units by mouth See admin instructions. Every other week.  2  . cloNIDine (CATAPRES - DOSED IN MG/24 HR) 0.2 mg/24hr patch Place 1 patch (0.2 mg total) onto the skin once a week.    . DULoxetine (CYMBALTA) 30 MG capsule Take 1 capsule by mouth daily.     . folic acid (FOLVITE) 1 MG tablet Take 1 tablet (1 mg total) by mouth daily.    . Magnesium Hydroxide (MILK OF MAGNESIA PO) Take 30 mLs by mouth as needed.    . Multiple Vitamins-Minerals (ICAPS MV) TABS Take 2 tablets by mouth at bedtime.     . Polysaccharide Iron Complex (POLY-IRON 150 PO) Take 1 tablet by mouth 2 (two) times daily.    Marland Kitchen thiamine 100 MG tablet Take 1 tablet (100 mg total) by mouth daily.     No current facility-administered medications for this visit.     Allergies as of 02/16/2020 - Review Complete 02/16/2020  Allergen Reaction Noted  . Demerol [meperidine] Nausea And Vomiting 06/20/2013  . Novocain [procaine]  04/04/2016  . Pork-derived products Nausea And Vomiting 02/16/2020    Vitals: BP 122/63 (BP Location: Left Arm, Patient Position: Sitting)   Pulse 76   Ht 5\' 3"  (1.6 m)   Wt 129 lb (58.5 kg)   BMI 22.85 kg/m  Last Weight:  Wt Readings from Last 1 Encounters:  02/16/20 129 lb (58.5 kg)   Last Height:   Ht Readings from Last 1 Encounters:  02/16/20 5\' 3"  (1.6 m)     Physical exam: Exam: Gen: NAD, conversant, thin, frail               CV: RRR, no MRG. No Carotid Bruits. No peripheral edema, warm, nontender Eyes: Conjunctivae clear without exudates or hemorrhage  Neuro: Detailed Neurologic Exam  Speech:    Speech is normal; fluent and spontaneous with impaired comprehension.  Cognition:    The patient is oriented to person, place, and time;     recent and remote memory impaired;     language fluent;     Impaired attention, concentration, impaired  fund of knowledge  Can repeat, name common items in the room, not aphasic.  Cranial Nerves:    The pupils are equal, round, and reactive to light. Attempted pupils too small to examinf fundi  Visual fields are full to finger confrontation. Extraocular movements are intact. Trigeminal sensation is intact and the muscles of mastication are normal. The face is symmetric. The palate elevates in the midline. Hearing slightly impaired to voice. Voice is normal. Shoulder shrug is normal. The tongue has normal motion without fasciculations.   Coordination:    Not ataxic but head and arm tremor  Gait:    Can get up independently, good stride and balance with walker  Motor Observation:    No asymmetry, bilateral tremor (doesn't appear parkinsonian) Tone:    Normal muscle tone.    Posture:  Posture is normal. normal erect    Strength: Can lift limbs antigravity  without drift. Guarding right arm due to previous injury, no drift, difficult exam due to cognitive losses.     Sensation: intact to LT     Reflex Exam:  DTR's:    Deep tendon reflexes in the upper and lower extremities are brisk bilaterally for age.   Toes:    The toes are downgoing bilaterally.   Clonus:    Clonus is absent.    Assessment/Plan: This is a 86 year old female who was admitted to [facility] on July 13 due to fall, she continued to be confused and not back to baseline and on 715 MRI of the brain showed bilateral embolic strokes left MCA and right ACA as well as left hemisphere subarachnoid hemorrhage and small left lateral ventricle intraventricular hemorrhage, CT of the head and neck were unremarkable, EF 55 to 60%, A1c was 5.1, LDL 77, she had a significant hemoglobin drop from 10.2 on admission to 6.9 and received 2 units of packed red blood cells, loop recorder was to be considered outpatient based on her appropriateness for anticoagulation and outpatient appointment.  In the hospital she is awake and alert however globally aphasia without following commands, Dr. Isabell Jarvis exam showed no gaze palsy, equal pupils, blinking to threat bilaterally, no significant facial droop, possibly some right-sided weakness however very difficult to evaluate and patient, stroke was concerning for cardioembolic source, the bleeding was likely due to hemorrhagic transformation as opposed to a traumatic bleed, patient not an anticoagulation candidate given bleeds and high fall risk at home and severe anemia needing blood transfusion, they did not recommend a cardioembolic work-up at that time.  She was started on aspirin and continued on statin.  Continue current medications: ASA 81mg  Likely cardioembolic cause but I do not see where she has been diagnosed with afib We discussed Atrial Fibrillation and increased risk of stroke Discussed: Would you consider Eliquis or other blood thinners given  increased risk of bleeding and falls IF she had afib? If no definitive diagnosis of atrial fibrillation has been made, could order a 30-day Holter monitor or Loop recorder (see below) but don't recommend if not a candidate for anticoagulation  I think it would be risky to consider anticoagulation given falls and anemia, I do NOT recommend anticoagulation but discussed so further eval for AF not recommended at this time Short term memory loss: can address in the future, may be dementia but at this time will defer, order lab tests today  Cc: Reynold Bowen, MD,  Dr. Tamala Julian (Cardiology)  Orders Placed This Encounter  Procedures  . B12 and Folate Panel  . Methylmalonic acid, serum  . Vitamin B1  . CBC    I had a long d/w patient about her stroke, risk for recurrent stroke/TIAs, personally independently reviewed imaging studies and stroke evaluation results and answered questions.Continue ASA for secondary stroke prevention and maintain strict control of hypertension with blood pressure goal below 130/90, diabetes with hemoglobin A1c goal below 6.5% and lipids with LDL cholesterol goal below 70 mg/dL.I also advised the patient to eat a healthy diet with plenty of whole grains, cereals, fruits and vegetables, exercise regularly and maintain ideal body weight .Discussed fall precautions.   Sarina Ill, MD  [facility] [address] [place], [place] [address]  Phone [phone] Fax [phone]  I spent 80 minutes of face-to-face and non-face-to-face time with patient on the  1. Cerebrovascular accident (CVA)  due to embolism of cerebral artery ([place])   2. Short-term memory loss   3. Anemia, unspecified type   4. Cryptogenic stroke ([place])    diagnosis.  This included previsit chart review, lab review, study review, order entry, electronic health record documentation, patient education on the different diagnostic and therapeutic options, counseling and coordination  of care, risks and benefits of management, compliance, or risk factor reduction

## 2020-02-16 ENCOUNTER — Encounter: Payer: Self-pay | Admitting: Neurology

## 2020-02-16 ENCOUNTER — Other Ambulatory Visit: Payer: Self-pay

## 2020-02-16 ENCOUNTER — Ambulatory Visit (INDEPENDENT_AMBULATORY_CARE_PROVIDER_SITE_OTHER): Payer: Medicare Other | Admitting: Neurology

## 2020-02-16 VITALS — BP 122/63 | HR 76 | Ht 63.0 in | Wt 129.0 lb

## 2020-02-16 DIAGNOSIS — D649 Anemia, unspecified: Secondary | ICD-10-CM

## 2020-02-16 DIAGNOSIS — I639 Cerebral infarction, unspecified: Secondary | ICD-10-CM | POA: Diagnosis not present

## 2020-02-16 DIAGNOSIS — R413 Other amnesia: Secondary | ICD-10-CM

## 2020-02-16 DIAGNOSIS — I6340 Cerebral infarction due to embolism of unspecified cerebral artery: Secondary | ICD-10-CM | POA: Diagnosis not present

## 2020-02-16 NOTE — Patient Instructions (Addendum)
Continue current medications We discussed Atrial Fibrillation and increased risk of stroke Would you consider Eliquis or other blood thinners given increased risk of bleeding and falls?  If no definitive diagnosis of atrial fibrillation has been made, could order a 30-day Holter monitor or Loop recorder (see below) but don't recommend if not a candidate for anticoagulation   Atrial Fibrillation  Atrial fibrillation is a type of irregular or rapid heartbeat (arrhythmia). In atrial fibrillation, the top part of the heart (atria) beats in an irregular pattern. This makes the heart unable to pump blood normally and effectively. The goal of treatment is to prevent blood clots from forming, control your heart rate, or restore your heartbeat to a normal rhythm. If this condition is not treated, it can cause serious problems, such as a weakened heart muscle (cardiomyopathy) or a stroke. What are the causes? This condition is often caused by medical conditions that damage the heart's electrical system. These include:  High blood pressure (hypertension). This is the most common cause.  Certain heart problems or conditions, such as heart failure, coronary artery disease, heart valve problems, or heart surgery.  Diabetes.  Overactive thyroid (hyperthyroidism).  Obesity.  Chronic kidney disease. In some cases, the cause of this condition is not known. What increases the risk? This condition is more likely to develop in:  Older people.  People who smoke.  Athletes who do endurance exercise.  People who have a family history of atrial fibrillation.  Men.  People who use drugs.  People who drink a lot of alcohol.  People who have lung conditions, such as emphysema, pneumonia, or COPD.  People who have obstructive sleep apnea. What are the signs or symptoms? Symptoms of this condition include:  A feeling that your heart is racing or beating irregularly.  Discomfort or pain in your  chest.  Shortness of breath.  Sudden light-headedness or weakness.  Tiring easily during exercise or activity.  Fatigue.  Syncope (fainting).  Sweating. In some cases, there are no symptoms. How is this diagnosed? Your health care provider may detect atrial fibrillation when taking your pulse. If detected, this condition may be diagnosed with:  An electrocardiogram (ECG) to check electrical signals of the heart.  An ambulatory cardiac monitor to record your heart's activity for a few days.  A transthoracic echocardiogram (TTE) to create pictures of your heart.  A transesophageal echocardiogram (TEE) to create even closer pictures of your heart.  A stress test to check your blood supply while you exercise.  Imaging tests, such as a CT scan or chest X-ray.  Blood tests. How is this treated? Treatment depends on underlying conditions and how you feel when you experience atrial fibrillation. This condition may be treated with:  Medicines to prevent blood clots or to treat heart rate or heart rhythm problems.  Electrical cardioversion to reset the heart's rhythm.  A pacemaker to correct abnormal heart rhythm.  Ablation to remove the heart tissue that sends abnormal signals.  Left atrial appendage closure to seal the area where blood clots can form. In some cases, underlying conditions will be treated. Follow these instructions at home: Medicines  Take over-the counter and prescription medicines only as told by your health care provider.  Do not take any new medicines without talking to your health care provider.  If you are taking blood thinners: ? Talk with your health care provider before you take any medicines that contain aspirin or NSAIDs, such as ibuprofen. These medicines increase your risk  for dangerous bleeding. ? Take your medicine exactly as told, at the same time every day. ? Avoid activities that could cause injury or bruising, and follow instructions  about how to prevent falls. ? Wear a medical alert bracelet or carry a card that lists what medicines you take. Lifestyle      Do not use any products that contain nicotine or tobacco, such as cigarettes, e-cigarettes, and chewing tobacco. If you need help quitting, ask your health care provider.  Eat heart-healthy foods. Talk with a dietitian to make an eating plan that is right for you.  Exercise regularly as told by your health care provider.  Do not drink alcohol.  Lose weight if you are overweight.  Do not use drugs, including cannabis. General instructions  If you have obstructive sleep apnea, manage your condition as told by your health care provider.  Do not use diet pills unless your health care provider approves. Diet pills can make heart problems worse.  Keep all follow-up visits as told by your health care provider. This is important. Contact a health care provider if you:  Notice a change in the rate, rhythm, or strength of your heartbeat.  Are taking a blood thinner and you notice more bruising.  Tire more easily when you exercise or do heavy work.  Have a sudden change in weight. Get help right away if you have:   Chest pain, abdominal pain, sweating, or weakness.  Trouble breathing.  Side effects of blood thinners, such as blood in your vomit, stool, or urine, or bleeding that cannot stop.  Any symptoms of a stroke. "BE FAST" is an easy way to remember the main warning signs of a stroke: ? B - Balance. Signs are dizziness, sudden trouble walking, or loss of balance. ? E - Eyes. Signs are trouble seeing or a sudden change in vision. ? F - Face. Signs are sudden weakness or numbness of the face, or the face or eyelid drooping on one side. ? A - Arms. Signs are weakness or numbness in an arm. This happens suddenly and usually on one side of the body. ? S - Speech. Signs are sudden trouble speaking, slurred speech, or trouble understanding what people  say. ? T - Time. Time to call emergency services. Write down what time symptoms started.  Other signs of a stroke, such as: ? A sudden, severe headache with no known cause. ? Nausea or vomiting. ? Seizure. These symptoms may represent a serious problem that is an emergency. Do not wait to see if the symptoms will go away. Get medical help right away. Call your local emergency services (911 in the [place] Do not drive yourself to the hospital. Summary  Atrial fibrillation is a type of irregular or rapid heartbeat (arrhythmia).  Symptoms include a feeling that your heart is beating fast or irregularly.  You may be given medicines to prevent blood clots or to treat heart rate or heart rhythm problems.  Get help right away if you have signs or symptoms of a stroke.  Get help right away if you cannot catch your breath or have chest pain or pressure. This information is not intended to replace advice given to you by your health care provider. Make sure you discuss any questions you have with your health care provider. Document Revised: 11/04/2018 Document Reviewed: 11/04/2018 Elsevier Patient Education  [place] Placement, Care After This sheet gives you information about how to care for yourself  after your procedure. Your health care provider may also give you more specific instructions. If you have problems or questions, contact your health care provider. What can I expect after the procedure? After the procedure, it is common to have:  Soreness or discomfort near the incision.  Some swelling or bruising near the incision. Follow these instructions at home: Incision care   Follow instructions from your health care provider about how to take care of your incision. Make sure you: ? Wash your hands with soap and water before you change your bandage (dressing). If soap and water are not available, use hand sanitizer. ? Change your dressing as told by  your health care provider. ? Keep your dressing dry. ? Leave stitches (sutures), skin glue, or adhesive strips in place. These skin closures may need to stay in place for 2 weeks or longer. If adhesive strip edges start to loosen and curl up, you may trim the loose edges. Do not remove adhesive strips completely unless your health care provider tells you to do that.  Check your incision area every day for signs of infection. Check for: ? Redness, swelling, or pain. ? Fluid or blood. ? Warmth. ? Pus or a bad smell.  Do not take baths, swim, or use a hot tub until your health care provider approves. Ask your health care provider if you can take showers. Activity   Return to your normal activities as told by your health care provider. Ask your health care provider what activities are safe for you.  Do not drive for 24 hours if you were given a sedative during your procedure. General instructions  Follow instructions from your health care provider about how to manage your implantable loop recorder and transmit the information. Learn how to activate a recording if this is necessary for your type of device.  Do not go through a metal detection gate, and do not let someone hold a metal detector over your chest. Show your ID card.  Do not have an MRI unless you check with your health care provider first.  Take over-the-counter and prescription medicines only as told by your health care provider.  Keep all follow-up visits as told by your health care provider. This is important. Contact a health care provider if:  You have redness, swelling, or pain around your incision.  You have a fever.  You have pain that is not relieved by your pain medicine.  You have triggered your device because of fainting (syncope) or because of a heartbeat that feels like it is racing, slow, fluttering, or skipping (palpitations). Get help right away if you have:  Chest pain.  Difficulty  breathing. Summary  After the procedure, it is common to have soreness or discomfort near the incision.  Change your dressing as told by your health care provider.  Follow instructions from your health care provider about how to manage your implantable loop recorder and transmit the information.  Keep all follow-up visits as told by your health care provider. This is important. This information is not intended to replace advice given to you by your health care provider. Make sure you discuss any questions you have with your health care provider. Document Revised: 06/28/2017 Document Reviewed: 06/28/2017 Elsevier Patient Education  2020 [employer].

## 2020-02-19 ENCOUNTER — Encounter: Payer: Self-pay | Admitting: Interventional Cardiology

## 2020-02-19 DIAGNOSIS — I69351 Hemiplegia and hemiparesis following cerebral infarction affecting right dominant side: Secondary | ICD-10-CM | POA: Diagnosis not present

## 2020-02-19 DIAGNOSIS — S42201D Unspecified fracture of upper end of right humerus, subsequent encounter for fracture with routine healing: Secondary | ICD-10-CM | POA: Diagnosis not present

## 2020-02-19 DIAGNOSIS — Z9189 Other specified personal risk factors, not elsewhere classified: Secondary | ICD-10-CM | POA: Insufficient documentation

## 2020-02-19 DIAGNOSIS — Z9181 History of falling: Secondary | ICD-10-CM | POA: Diagnosis not present

## 2020-02-19 DIAGNOSIS — S42301A Unspecified fracture of shaft of humerus, right arm, initial encounter for closed fracture: Secondary | ICD-10-CM | POA: Diagnosis not present

## 2020-02-22 ENCOUNTER — Telehealth: Payer: Self-pay | Admitting: *Deleted

## 2020-02-22 LAB — B12 AND FOLATE PANEL
Folate: >20 ng/mL
Vitamin B-12: 592 pg/mL (ref 232–1245)

## 2020-02-22 LAB — CBC
Hematocrit: 38.9 % (ref 34.0–46.6)
Hemoglobin: 12.7 g/dL (ref 11.1–15.9)
MCH: 32 pg (ref 26.6–33.0)
MCHC: 32.6 g/dL (ref 31.5–35.7)
MCV: 98 fL — ABNORMAL HIGH (ref 79–97)
Platelets: 167 x10E3/uL (ref 150–450)
RBC: 3.97 x10E6/uL (ref 3.77–5.28)
RDW: 11.9 % (ref 11.7–15.4)
WBC: 4.9 x10E3/uL (ref 3.4–10.8)

## 2020-02-22 LAB — VITAMIN B1: Thiamine: 225.5 nmol/L — ABNORMAL HIGH (ref 66.5–200.0)

## 2020-02-22 LAB — METHYLMALONIC ACID, SERUM: Methylmalonic Acid: 292 nmol/L (ref 0–378)

## 2020-02-22 NOTE — Telephone Encounter (Signed)
I called the pt on home # on chart. LVM asking for call back. Left office number and hours in message.

## 2020-02-22 NOTE — Telephone Encounter (Signed)
-----   Message from Melvenia Beam, MD sent at 02/19/2020  2:41 PM EDT ----- Blood work looks fine. Still waiting on one lab and if that is abnormal we will let her know thanks

## 2020-02-23 DIAGNOSIS — Z9181 History of falling: Secondary | ICD-10-CM | POA: Diagnosis not present

## 2020-02-23 DIAGNOSIS — S42301A Unspecified fracture of shaft of humerus, right arm, initial encounter for closed fracture: Secondary | ICD-10-CM | POA: Diagnosis not present

## 2020-02-23 DIAGNOSIS — I69351 Hemiplegia and hemiparesis following cerebral infarction affecting right dominant side: Secondary | ICD-10-CM | POA: Diagnosis not present

## 2020-02-23 DIAGNOSIS — S42201D Unspecified fracture of upper end of right humerus, subsequent encounter for fracture with routine healing: Secondary | ICD-10-CM | POA: Diagnosis not present

## 2020-02-23 NOTE — Telephone Encounter (Signed)
Called pt and LVM (ok per DPR) letting her know the lab results looked fine. Asked for call back if she has any questions.

## 2020-02-24 DIAGNOSIS — S42301A Unspecified fracture of shaft of humerus, right arm, initial encounter for closed fracture: Secondary | ICD-10-CM | POA: Diagnosis not present

## 2020-02-24 DIAGNOSIS — I69351 Hemiplegia and hemiparesis following cerebral infarction affecting right dominant side: Secondary | ICD-10-CM | POA: Diagnosis not present

## 2020-02-24 DIAGNOSIS — S42201D Unspecified fracture of upper end of right humerus, subsequent encounter for fracture with routine healing: Secondary | ICD-10-CM | POA: Diagnosis not present

## 2020-02-24 DIAGNOSIS — Z9181 History of falling: Secondary | ICD-10-CM | POA: Diagnosis not present

## 2020-02-28 DIAGNOSIS — I69351 Hemiplegia and hemiparesis following cerebral infarction affecting right dominant side: Secondary | ICD-10-CM | POA: Diagnosis not present

## 2020-02-28 DIAGNOSIS — Z9181 History of falling: Secondary | ICD-10-CM | POA: Diagnosis not present

## 2020-02-28 DIAGNOSIS — R4701 Aphasia: Secondary | ICD-10-CM | POA: Diagnosis not present

## 2020-02-28 DIAGNOSIS — S42201D Unspecified fracture of upper end of right humerus, subsequent encounter for fracture with routine healing: Secondary | ICD-10-CM | POA: Diagnosis not present

## 2020-02-28 DIAGNOSIS — S42301A Unspecified fracture of shaft of humerus, right arm, initial encounter for closed fracture: Secondary | ICD-10-CM | POA: Diagnosis not present

## 2020-02-29 DIAGNOSIS — Z9181 History of falling: Secondary | ICD-10-CM | POA: Diagnosis not present

## 2020-02-29 DIAGNOSIS — I69351 Hemiplegia and hemiparesis following cerebral infarction affecting right dominant side: Secondary | ICD-10-CM | POA: Diagnosis not present

## 2020-02-29 DIAGNOSIS — R4701 Aphasia: Secondary | ICD-10-CM | POA: Diagnosis not present

## 2020-02-29 DIAGNOSIS — S42201D Unspecified fracture of upper end of right humerus, subsequent encounter for fracture with routine healing: Secondary | ICD-10-CM | POA: Diagnosis not present

## 2020-02-29 DIAGNOSIS — S42301A Unspecified fracture of shaft of humerus, right arm, initial encounter for closed fracture: Secondary | ICD-10-CM | POA: Diagnosis not present

## 2020-03-01 DIAGNOSIS — S42201D Unspecified fracture of upper end of right humerus, subsequent encounter for fracture with routine healing: Secondary | ICD-10-CM | POA: Diagnosis not present

## 2020-03-01 DIAGNOSIS — S42301A Unspecified fracture of shaft of humerus, right arm, initial encounter for closed fracture: Secondary | ICD-10-CM | POA: Diagnosis not present

## 2020-03-01 DIAGNOSIS — I69351 Hemiplegia and hemiparesis following cerebral infarction affecting right dominant side: Secondary | ICD-10-CM | POA: Diagnosis not present

## 2020-03-01 DIAGNOSIS — R4701 Aphasia: Secondary | ICD-10-CM | POA: Diagnosis not present

## 2020-03-01 DIAGNOSIS — Z9181 History of falling: Secondary | ICD-10-CM | POA: Diagnosis not present

## 2020-03-02 DIAGNOSIS — R4701 Aphasia: Secondary | ICD-10-CM | POA: Diagnosis not present

## 2020-03-02 DIAGNOSIS — I69351 Hemiplegia and hemiparesis following cerebral infarction affecting right dominant side: Secondary | ICD-10-CM | POA: Diagnosis not present

## 2020-03-02 DIAGNOSIS — Z9181 History of falling: Secondary | ICD-10-CM | POA: Diagnosis not present

## 2020-03-02 DIAGNOSIS — S42301A Unspecified fracture of shaft of humerus, right arm, initial encounter for closed fracture: Secondary | ICD-10-CM | POA: Diagnosis not present

## 2020-03-02 DIAGNOSIS — S42201D Unspecified fracture of upper end of right humerus, subsequent encounter for fracture with routine healing: Secondary | ICD-10-CM | POA: Diagnosis not present

## 2020-03-06 ENCOUNTER — Other Ambulatory Visit: Payer: Self-pay | Admitting: Internal Medicine

## 2020-03-07 DIAGNOSIS — S42301A Unspecified fracture of shaft of humerus, right arm, initial encounter for closed fracture: Secondary | ICD-10-CM | POA: Diagnosis not present

## 2020-03-07 DIAGNOSIS — R4701 Aphasia: Secondary | ICD-10-CM | POA: Diagnosis not present

## 2020-03-07 DIAGNOSIS — S42201D Unspecified fracture of upper end of right humerus, subsequent encounter for fracture with routine healing: Secondary | ICD-10-CM | POA: Diagnosis not present

## 2020-03-07 DIAGNOSIS — Z9181 History of falling: Secondary | ICD-10-CM | POA: Diagnosis not present

## 2020-03-07 DIAGNOSIS — I69351 Hemiplegia and hemiparesis following cerebral infarction affecting right dominant side: Secondary | ICD-10-CM | POA: Diagnosis not present

## 2020-03-09 DIAGNOSIS — S42301A Unspecified fracture of shaft of humerus, right arm, initial encounter for closed fracture: Secondary | ICD-10-CM | POA: Diagnosis not present

## 2020-03-09 DIAGNOSIS — S42201D Unspecified fracture of upper end of right humerus, subsequent encounter for fracture with routine healing: Secondary | ICD-10-CM | POA: Diagnosis not present

## 2020-03-09 DIAGNOSIS — I69351 Hemiplegia and hemiparesis following cerebral infarction affecting right dominant side: Secondary | ICD-10-CM | POA: Diagnosis not present

## 2020-03-09 DIAGNOSIS — Z9181 History of falling: Secondary | ICD-10-CM | POA: Diagnosis not present

## 2020-03-09 DIAGNOSIS — R4701 Aphasia: Secondary | ICD-10-CM | POA: Diagnosis not present

## 2020-03-14 DIAGNOSIS — R4701 Aphasia: Secondary | ICD-10-CM | POA: Diagnosis not present

## 2020-03-14 DIAGNOSIS — S42301A Unspecified fracture of shaft of humerus, right arm, initial encounter for closed fracture: Secondary | ICD-10-CM | POA: Diagnosis not present

## 2020-03-14 DIAGNOSIS — I69351 Hemiplegia and hemiparesis following cerebral infarction affecting right dominant side: Secondary | ICD-10-CM | POA: Diagnosis not present

## 2020-03-14 DIAGNOSIS — S42201D Unspecified fracture of upper end of right humerus, subsequent encounter for fracture with routine healing: Secondary | ICD-10-CM | POA: Diagnosis not present

## 2020-03-14 DIAGNOSIS — Z9181 History of falling: Secondary | ICD-10-CM | POA: Diagnosis not present

## 2020-03-17 DIAGNOSIS — S42201D Unspecified fracture of upper end of right humerus, subsequent encounter for fracture with routine healing: Secondary | ICD-10-CM | POA: Diagnosis not present

## 2020-03-17 DIAGNOSIS — Z9181 History of falling: Secondary | ICD-10-CM | POA: Diagnosis not present

## 2020-03-17 DIAGNOSIS — R4701 Aphasia: Secondary | ICD-10-CM | POA: Diagnosis not present

## 2020-03-17 DIAGNOSIS — S42301A Unspecified fracture of shaft of humerus, right arm, initial encounter for closed fracture: Secondary | ICD-10-CM | POA: Diagnosis not present

## 2020-03-17 DIAGNOSIS — I69351 Hemiplegia and hemiparesis following cerebral infarction affecting right dominant side: Secondary | ICD-10-CM | POA: Diagnosis not present

## 2020-03-21 DIAGNOSIS — Z9181 History of falling: Secondary | ICD-10-CM | POA: Diagnosis not present

## 2020-03-21 DIAGNOSIS — S42301A Unspecified fracture of shaft of humerus, right arm, initial encounter for closed fracture: Secondary | ICD-10-CM | POA: Diagnosis not present

## 2020-03-21 DIAGNOSIS — R4701 Aphasia: Secondary | ICD-10-CM | POA: Diagnosis not present

## 2020-03-21 DIAGNOSIS — I69351 Hemiplegia and hemiparesis following cerebral infarction affecting right dominant side: Secondary | ICD-10-CM | POA: Diagnosis not present

## 2020-03-21 DIAGNOSIS — S42201D Unspecified fracture of upper end of right humerus, subsequent encounter for fracture with routine healing: Secondary | ICD-10-CM | POA: Diagnosis not present

## 2020-03-29 DIAGNOSIS — Z23 Encounter for immunization: Secondary | ICD-10-CM | POA: Diagnosis not present

## 2020-03-30 DIAGNOSIS — Z23 Encounter for immunization: Secondary | ICD-10-CM | POA: Diagnosis not present

## 2020-04-13 DIAGNOSIS — R4701 Aphasia: Secondary | ICD-10-CM | POA: Diagnosis not present

## 2020-04-13 DIAGNOSIS — S42201D Unspecified fracture of upper end of right humerus, subsequent encounter for fracture with routine healing: Secondary | ICD-10-CM | POA: Diagnosis not present

## 2020-04-13 DIAGNOSIS — I69351 Hemiplegia and hemiparesis following cerebral infarction affecting right dominant side: Secondary | ICD-10-CM | POA: Diagnosis not present

## 2020-04-13 DIAGNOSIS — Z9181 History of falling: Secondary | ICD-10-CM | POA: Diagnosis not present

## 2020-04-13 DIAGNOSIS — S42301A Unspecified fracture of shaft of humerus, right arm, initial encounter for closed fracture: Secondary | ICD-10-CM | POA: Diagnosis not present

## 2020-04-14 DIAGNOSIS — S42201D Unspecified fracture of upper end of right humerus, subsequent encounter for fracture with routine healing: Secondary | ICD-10-CM | POA: Diagnosis not present

## 2020-04-14 DIAGNOSIS — S42301A Unspecified fracture of shaft of humerus, right arm, initial encounter for closed fracture: Secondary | ICD-10-CM | POA: Diagnosis not present

## 2020-04-14 DIAGNOSIS — I69351 Hemiplegia and hemiparesis following cerebral infarction affecting right dominant side: Secondary | ICD-10-CM | POA: Diagnosis not present

## 2020-04-14 DIAGNOSIS — R4701 Aphasia: Secondary | ICD-10-CM | POA: Diagnosis not present

## 2020-04-14 DIAGNOSIS — Z9181 History of falling: Secondary | ICD-10-CM | POA: Diagnosis not present

## 2020-04-19 DIAGNOSIS — R4701 Aphasia: Secondary | ICD-10-CM | POA: Diagnosis not present

## 2020-04-19 DIAGNOSIS — S42301A Unspecified fracture of shaft of humerus, right arm, initial encounter for closed fracture: Secondary | ICD-10-CM | POA: Diagnosis not present

## 2020-04-19 DIAGNOSIS — S42201D Unspecified fracture of upper end of right humerus, subsequent encounter for fracture with routine healing: Secondary | ICD-10-CM | POA: Diagnosis not present

## 2020-04-19 DIAGNOSIS — I69351 Hemiplegia and hemiparesis following cerebral infarction affecting right dominant side: Secondary | ICD-10-CM | POA: Diagnosis not present

## 2020-04-19 DIAGNOSIS — Z9181 History of falling: Secondary | ICD-10-CM | POA: Diagnosis not present

## 2020-04-22 DIAGNOSIS — S42201D Unspecified fracture of upper end of right humerus, subsequent encounter for fracture with routine healing: Secondary | ICD-10-CM | POA: Diagnosis not present

## 2020-04-22 DIAGNOSIS — R4701 Aphasia: Secondary | ICD-10-CM | POA: Diagnosis not present

## 2020-04-22 DIAGNOSIS — S42301A Unspecified fracture of shaft of humerus, right arm, initial encounter for closed fracture: Secondary | ICD-10-CM | POA: Diagnosis not present

## 2020-04-22 DIAGNOSIS — Z9181 History of falling: Secondary | ICD-10-CM | POA: Diagnosis not present

## 2020-04-22 DIAGNOSIS — I69351 Hemiplegia and hemiparesis following cerebral infarction affecting right dominant side: Secondary | ICD-10-CM | POA: Diagnosis not present

## 2020-04-26 DIAGNOSIS — S42201D Unspecified fracture of upper end of right humerus, subsequent encounter for fracture with routine healing: Secondary | ICD-10-CM | POA: Diagnosis not present

## 2020-04-26 DIAGNOSIS — S42301A Unspecified fracture of shaft of humerus, right arm, initial encounter for closed fracture: Secondary | ICD-10-CM | POA: Diagnosis not present

## 2020-04-26 DIAGNOSIS — Z9181 History of falling: Secondary | ICD-10-CM | POA: Diagnosis not present

## 2020-04-29 DIAGNOSIS — Z9181 History of falling: Secondary | ICD-10-CM | POA: Diagnosis not present

## 2020-04-29 DIAGNOSIS — S42201D Unspecified fracture of upper end of right humerus, subsequent encounter for fracture with routine healing: Secondary | ICD-10-CM | POA: Diagnosis not present

## 2020-04-29 DIAGNOSIS — S42301A Unspecified fracture of shaft of humerus, right arm, initial encounter for closed fracture: Secondary | ICD-10-CM | POA: Diagnosis not present

## 2020-05-01 DIAGNOSIS — S42201D Unspecified fracture of upper end of right humerus, subsequent encounter for fracture with routine healing: Secondary | ICD-10-CM | POA: Diagnosis not present

## 2020-05-01 DIAGNOSIS — S42301A Unspecified fracture of shaft of humerus, right arm, initial encounter for closed fracture: Secondary | ICD-10-CM | POA: Diagnosis not present

## 2020-05-01 DIAGNOSIS — Z9181 History of falling: Secondary | ICD-10-CM | POA: Diagnosis not present

## 2020-05-04 DIAGNOSIS — S42301A Unspecified fracture of shaft of humerus, right arm, initial encounter for closed fracture: Secondary | ICD-10-CM | POA: Diagnosis not present

## 2020-05-04 DIAGNOSIS — Z9181 History of falling: Secondary | ICD-10-CM | POA: Diagnosis not present

## 2020-05-04 DIAGNOSIS — S42201D Unspecified fracture of upper end of right humerus, subsequent encounter for fracture with routine healing: Secondary | ICD-10-CM | POA: Diagnosis not present

## 2020-05-11 DIAGNOSIS — S42301A Unspecified fracture of shaft of humerus, right arm, initial encounter for closed fracture: Secondary | ICD-10-CM | POA: Diagnosis not present

## 2020-05-11 DIAGNOSIS — Z9181 History of falling: Secondary | ICD-10-CM | POA: Diagnosis not present

## 2020-05-11 DIAGNOSIS — S42201D Unspecified fracture of upper end of right humerus, subsequent encounter for fracture with routine healing: Secondary | ICD-10-CM | POA: Diagnosis not present

## 2020-05-12 DIAGNOSIS — S42301A Unspecified fracture of shaft of humerus, right arm, initial encounter for closed fracture: Secondary | ICD-10-CM | POA: Diagnosis not present

## 2020-05-12 DIAGNOSIS — Z9181 History of falling: Secondary | ICD-10-CM | POA: Diagnosis not present

## 2020-05-12 DIAGNOSIS — S42201D Unspecified fracture of upper end of right humerus, subsequent encounter for fracture with routine healing: Secondary | ICD-10-CM | POA: Diagnosis not present

## 2020-05-16 DIAGNOSIS — Z9181 History of falling: Secondary | ICD-10-CM | POA: Diagnosis not present

## 2020-05-16 DIAGNOSIS — S42201D Unspecified fracture of upper end of right humerus, subsequent encounter for fracture with routine healing: Secondary | ICD-10-CM | POA: Diagnosis not present

## 2020-05-16 DIAGNOSIS — S42301A Unspecified fracture of shaft of humerus, right arm, initial encounter for closed fracture: Secondary | ICD-10-CM | POA: Diagnosis not present

## 2020-05-17 DIAGNOSIS — S42301A Unspecified fracture of shaft of humerus, right arm, initial encounter for closed fracture: Secondary | ICD-10-CM | POA: Diagnosis not present

## 2020-05-17 DIAGNOSIS — S42201D Unspecified fracture of upper end of right humerus, subsequent encounter for fracture with routine healing: Secondary | ICD-10-CM | POA: Diagnosis not present

## 2020-05-17 DIAGNOSIS — Z9181 History of falling: Secondary | ICD-10-CM | POA: Diagnosis not present

## 2020-05-23 DIAGNOSIS — S42301A Unspecified fracture of shaft of humerus, right arm, initial encounter for closed fracture: Secondary | ICD-10-CM | POA: Diagnosis not present

## 2020-05-23 DIAGNOSIS — Z9181 History of falling: Secondary | ICD-10-CM | POA: Diagnosis not present

## 2020-05-23 DIAGNOSIS — S42201D Unspecified fracture of upper end of right humerus, subsequent encounter for fracture with routine healing: Secondary | ICD-10-CM | POA: Diagnosis not present

## 2020-05-25 DIAGNOSIS — S42301A Unspecified fracture of shaft of humerus, right arm, initial encounter for closed fracture: Secondary | ICD-10-CM | POA: Diagnosis not present

## 2020-05-25 DIAGNOSIS — S42201D Unspecified fracture of upper end of right humerus, subsequent encounter for fracture with routine healing: Secondary | ICD-10-CM | POA: Diagnosis not present

## 2020-05-25 DIAGNOSIS — Z9181 History of falling: Secondary | ICD-10-CM | POA: Diagnosis not present

## 2020-08-15 ENCOUNTER — Other Ambulatory Visit: Payer: Self-pay

## 2020-08-15 ENCOUNTER — Encounter: Payer: Self-pay | Admitting: Adult Health

## 2020-08-15 ENCOUNTER — Ambulatory Visit (INDEPENDENT_AMBULATORY_CARE_PROVIDER_SITE_OTHER): Payer: Medicare Other | Admitting: Adult Health

## 2020-08-15 VITALS — BP 126/72 | HR 77 | Ht 63.0 in | Wt 124.0 lb

## 2020-08-15 DIAGNOSIS — I639 Cerebral infarction, unspecified: Secondary | ICD-10-CM

## 2020-08-15 DIAGNOSIS — R413 Other amnesia: Secondary | ICD-10-CM

## 2020-08-15 NOTE — Progress Notes (Addendum)
[facility]    Provider:  Dr [name] Requesting Provider: Rosalin Hawking, MD Primary Care Provider:  Reynold Bowen, MD  CC:   Chief Complaint  Patient presents with  . Follow-up    RM 62 With daughter Jennifer Rocha) Pt is having some L side weakness and numbness in hand, overall well on stroke standpoint      HPI:   Today, 08/15/2020, Jennifer Rocha returns for 104-month stroke follow-up.  Seen by Dr. [name] 01/2020 for initial hospital follow-up accompanied by her daughter, Jennifer Rocha.  Since prior visit, stable from stroke standpoint without new or worsening stroke/TIA symptoms Reports residual left hand numbness and word finding difficulty.  Word finding difficulty has been gradually improving but left hand numbness stable. Short-term memory stable without worsening.  Routinely participating in activities at ALF, doing memory exercises, reading and socializing  Continues to work with therapies three times weekly for right arm with hx of humerus fracture Use of RW today but not always compliant - denies any recent falls She continues to reside [facility]  - they assist with medication administration but maintains ADLs independently   Compliant on aspirin and atorvastatin -denies associated side effects Blood pressure today 126/72 - typically stable per patient  Monitored by Doctors making housecalls Dr. Gwyneth Sprout regularly  No new concerns at this time      Initial visit 02/16/2020 Dr. [name] (copied for reference purposes only)  Jennifer Rocha is a 87 y.o. female here as requested by Dr. Erlinda Hong for follow-up after embolic stroke.  Past medical history alcohol abuse, hypertension, hyperlipidemia, nonischemic cardiomyopathy with left heart failure, embolic stroke in July of this year, history of falls, short-term memory issues.  I reviewed hospitalization notes, she presented on July the 15th of this year for evaluation after a fall, she was actually admitted on the 13th, last time  family had spoken to her was 3 days prior, when she did not pick up her paper for 2 days family was alerted and thought she may have had a fall 2 days prior, patient was unable to provide any history, history of falls, family visited her and found her to have difficulty remembering names, she also had some repetition.  She was evaluated at [facility] on the 13th and had a right humeral fracture and mild rhabdo, patient continued to appear confused an MRI of the brain was performed inpatient which showed a left MCA territory subacute infarct as well as a cortical infarct in the right superior frontal lobe, small volume bilateral intraventricular hemorrhage and subarachnoid hemorrhage in the interhemispheric fissure as well as left frontal sylvian fissure and neurology was called to see her on July 15.  Initial neurology exam showed an awake alert patient, appearing to mimic some commands and following simple hand gestures but not simple verbal commands, she could not give a history and was nonverbal, cranial nerves showed diminished blink from threat on the right, difficult motor exam due to right arm injury, but possibly decreased on the right with decreased withdrawal to sensory on the right as well.  NIH stroke scale was 13.  She was initiated on aspirin for secondary stroke prevention when hemoglobin stabilized, she was continued on atorvastatin 40 mg, anticoagulation was deferred and loop recorder may be considered outpatient if she is a good candidate for anticoagulation,  She is here with son, she doesn't remember anything about the hospital, she went to rehab and she lives at [facility]. She is doing extremely  well. She is in assisted living. The food is good. Her son is here and provides much information. She is doing excellent. Meals are excellent. They have transportation, she plays bingo. She was referred to cardiology on 12/21/2019. She has seen cardiology. She states today she is not  falling, she feels very stable, she has not fallen since the hospital. We reviewed all the workup, the strokes, reasons for strokes, Afib and cardioembolic causes, she does not like to use the walker per her son. She has residual lert hand numbness.   Reviewed notes, labs and imaging from outside physicians, which showed: see above  Review of Systems: Patient complains of symptoms per HPI. Pertinent negatives and positives per HPI. All others negative.   Social History   Socioeconomic History  . Marital status: Widowed    Spouse name: Not on file  . Number of children: 3  . Years of education: 62  . Highest education level: Not on file  Occupational History  . Occupation: Retired   Tobacco Use  . Smoking status: Former Smoker    Quit date: 05/27/1953    Years since quitting: 67.2  . Smokeless tobacco: Never Used  Substance and Sexual Activity  . Alcohol use: Not Currently    Alcohol/week: 21.0 standard drinks    Types: 21 Glasses of wine per week    Comment: 3 glasses per day  . Drug use: Not Currently    Comment: has abused prescription drugs at times  . Sexual activity: Not on file  Other Topics Concern  . Not on file  Social History Narrative   Resides at [facility] [phone]   1-2 cups coffee per day   Right handed but now uses left due to previous arm fracture that makes it difficult, so uses left most of time   Social Determinants of Health   Financial Resource Strain: Not on file  Food Insecurity: Not on file  Transportation Needs: Not on file  Physical Activity: Not on file  Stress: Not on file  Social Connections: Not on file  Intimate Partner Violence: Not on file    Family History  Adopted: Yes  Problem Relation Age of Onset  . Heart failure Mother   . Heart disease Brother   . Alcohol abuse Other        her daughter, addiction "gallops in our family"    Past Medical History:  Diagnosis Date  . Alcohol dependence  ([place])   . Atrial fibrillation ([place])   . Hearing loss   . HLD (hyperlipidemia)   . Hypertension   . Left heart failure ([place])   . Non-ischemic cardiomyopathy ([place])    with EF 20% improving to 50%  . Stroke [facility])    12/09/2019    Patient Active Problem List   Diagnosis Date Noted  . At high risk for bleeding 02/19/2020  . FTT (failure to thrive) in adult   . Adult failure to thrive   . Weakness generalized   . Goals of care, counseling/discussion   . Cerebral embolism with cerebral infarction 12/10/2019  . Anemia   . Closed fracture of neck of right humerus   . Acute pain of right shoulder   . DNR (do not resuscitate)   . Palliative care by specialist   . Traumatic rhabdomyolysis ([place]) 12/07/2019  . Fracture of humeral head, closed, right, initial encounter 12/07/2019  . Fall at home, initial encounter 12/07/2019  . Alcohol dependence syndrome ([place]) 12/07/2019  .  Benign essential tremor 01/27/2015  . Chronic combined systolic (congestive) and diastolic (congestive) heart failure ([place]) 08/23/2013  . 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 Non-ischemic cardiomyopathy ([place])   . Hypertension     Past Surgical History:  Procedure Laterality Date  . ANKLE SURGERY Right   . APPENDECTOMY  1941  . TONSILLECTOMY      Current Outpatient Medications  Medication Sig Dispense Refill  . acetaminophen (TYLENOL) 325 MG tablet Take 650 mg by mouth every 6 (six) hours as needed.    Marland Kitchen aspirin EC 81 MG tablet Take 81 mg by mouth daily.    Marland Kitchen atorvastatin (LIPITOR) 40 MG tablet Take 2 tablets by mouth daily.     . carvedilol (COREG) 6.25 MG tablet Take 1 tablet by mouth 2 (two) times daily.     . Cholecalciferol (VITAMIN D3) 50000 units CAPS Take 50,000 Units by mouth See admin instructions. Every other week.  2  . cloNIDine (CATAPRES - DOSED IN MG/24 HR) 0.2 mg/24hr patch Place 1 patch (0.2 mg total) onto the skin once a week.    . DULoxetine (CYMBALTA) 30 MG capsule Take 1 capsule by mouth  daily.     . folic acid (FOLVITE) 1 MG tablet Take 1 tablet (1 mg total) by mouth daily.    . Magnesium Hydroxide (MILK OF MAGNESIA PO) Take 30 mLs by mouth as needed.    . Multiple Vitamins-Minerals (ICAPS MV) TABS Take 2 tablets by mouth at bedtime.     . Polysaccharide Iron Complex (POLY-IRON 150 PO) Take 1 tablet by mouth 2 (two) times daily.    Marland Kitchen thiamine 100 MG tablet Take 1 tablet (100 mg total) by mouth daily.     No current facility-administered medications for this visit.    Allergies as of 08/15/2020 - Review Complete 08/15/2020  Allergen Reaction Noted  . Demerol [meperidine] Nausea And Vomiting 06/20/2013  . Novocain [procaine]  04/04/2016  . Pork-derived products Nausea And Vomiting 02/16/2020    Vitals: Today's Vitals   08/15/20 1048  Weight: 124 lb (56.2 kg)  Height: 5\' 3"  (1.6 m)   Body mass index is 21.97 kg/m.    Physical exam: Exam: Gen: NAD, very pleasant elderly Caucasian female, conversant, thin, frail               CV: RRR, no MRG. No Carotid Bruits. No peripheral edema, warm, nontender Eyes: Conjunctivae clear without exudates or hemorrhage  Neuro: Detailed Neurologic Exam  Speech: Occasional speech hesitancy with nonfluent speech but unable to appreciate true aphasia or dysarthria Cognition:    The patient is oriented to person, place, and time;     recent memory impaired and remote memory intact    language fluent;     Impaired attention, concentration, impaired  fund of knowledge  Cranial Nerves:    The pupils are equal, round, and reactive to light. Visual fields are full to finger confrontation. Extraocular movements are intact. Trigeminal sensation is intact and the muscles of mastication are normal. The face is symmetric. The palate elevates in the midline. Hearing slightly impaired to voice. Voice is normal. Shoulder shrug is normal. The tongue has normal motion without fasciculations.   Coordination:    Not ataxic but head and arm  tremor - chronic  Gait:    Can get up independently, good stride and balance with walker  Motor Observation:    No asymmetry, bilateral tremor (doesn't appear parkinsonian) Tone:    Normal muscle tone.  Posture:    Posture is normal. normal erect    Strength: Can lift limbs antigravity without drift. Guarding right arm due to previous injury and difficulty fully assessing strength but unable to appreciate any weakness     Sensation: intact to LT     Reflex Exam:  DTR's:    Deep tendon reflexes in the upper and lower extremities are brisk bilaterally for age.   Toes:    The toes are downgoing bilaterally.   Clonus:    Clonus is absent.    Assessment/Plan: This is a 87 year old female who was admitted to [facility] on 12/07/2019 due to fall, she continued to be confused and not back to baseline with evidence of bilateral embolic strokes left MCA and right ACA as well as left hemisphere subarachnoid hemorrhage and small left lateral ventricle intraventricular hemorrhage, infarct likely embolic secondary to unknown source.  Recommend consideration of ILR outpatient - at prior visit with Dr. [name], she did not recommend further cardioembolic work-up as she would not be a good anticoagulant candidate with history of falls, etoh abuse and anemia    1. L MCA and R ACA strokes :  a. Residual deficit: mild left hand numbness - stable.  b. Continue aspirin 81 mg daily  and atorvastatin 40 mg daily for secondary stroke prevention.   c. Discussed secondary stroke prevention measures and importance of close PCP f/u for aggressive stroke risk factor management  2. HTN: BP goal <130/90.  Well-controlled on current regimen per PCP 3. HLD: LDL goal <70.  On atorvastatin 40 mg daily per PCP - unsure if labs completed recently -requested results be faxed to office if able 4. Short-term memory loss: Stable. Prior mention of possible progressive dementia.  Able to maintain ADLs  independently.  Continues to reside at ALF.  Likely multifactorial in setting of stroke history, advanced age and prior EtOH use.  Discussed importance of memory exercises, socialization, routine physical activity, healthy diet and adequate sleep as well as importance of managing stroke risk factors      CC:  [facility] provider: Dr. Boykin Reaper, Annie Main, MD   I spent 30 minutes of face-to-face and non-face-to-face time with patient and daughter.  This included previsit chart review, lab review, study review, order entry, electronic health record documentation, patient education and discussion regarding history of prior stroke and residual deficits, short-term memory loss, importance of managing stroke risk factors and answered all other questions to patient satisfaction  Frann Rider, [facility]  [facility] [address] [place], [place] [address]  Phone [phone] Fax [phone] Note: This document was prepared with digital dictation and possible smart phrase technology. Any transcriptional errors that result from this process are unintentional.  Made any corrections needed, and agree with history, physical, neuro exam,assessment and plan as stated.     Sarina Ill, MD [facility]

## 2020-08-15 NOTE — Patient Instructions (Addendum)
In regards to memory concerns, highly recommend doing memory exercises, games, socialize, eating healthy, routine exercise, and good sleep  Continue aspirin 81 mg daily  and atorvastatin 40mg  daily  for secondary stroke prevention  Continue to follow up with PCP regarding cholesterol and blood pressure management  Maintain strict control of hypertension with blood pressure goal below 130/90 and cholesterol with LDL cholesterol (bad cholesterol) goal below 70 mg/dL.        Thank you for coming to see [place] at [facility]. I hope we have been able to provide you high quality care today.  You may receive a patient satisfaction survey over the next few weeks. We would appreciate your feedback and comments so that we may continue to improve ourselves and the health of our patients.

## 2021-01-07 NOTE — Progress Notes (Signed)
Cardiology Office Note:    Date:  01/11/2021   ID:  Jennifer Rocha, DOB 1928/05/05, MRN [id number]  PCP:  Orvis Brill, [facility]  Cardiologist:  Sinclair Grooms, MD   Referring MD: Orvis Brill, [facility]*   Chief Complaint  Patient presents with   Congestive Heart Failure     History of Present Illness:    Jennifer Rocha is a 87 y.o. female with a hx of hyperlipidemia, essential hypertension, nonischemic cardiomyopathy with HFrEF--> HFpEF with most recent EF 54% by nuclear wall motion study, CVA cardioembolic and right frontal lobe SAH, alcohol withdrawal, and DNR..   We had discussion concerning her hospital stay in 2021 when she had alcohol withdrawal, fractured right arm and shoulder, stroke, and subarachnoid hemorrhage.  The stroke was felt to be cardioembolic.  She has undergone significant rehabilitation and there has been improvement in speech and mobility.  [facility]  She is happy there.  She is accompanied by her son today.  Doing the stressful July 2021 hospital stay, she had no cardiac abnormalities.  An echocardiogram was performed and has been reviewed.  He is here today for cardiology follow-up.  First involvement was for reduced LV function.  Probably of mixed etiology including excessive alcohol intake.  Past Medical History:  Diagnosis Date   Alcohol dependence ([place])    Atrial fibrillation ([place])    Hearing loss    HLD (hyperlipidemia)    Hypertension    Left heart failure ([place])    Non-ischemic cardiomyopathy ([place])    with EF 20% improving to 50%   Stroke ([place])    12/09/2019    Past Surgical History:  Procedure Laterality Date   ANKLE SURGERY Right    APPENDECTOMY  1941   TONSILLECTOMY      Current Medications: Current Meds  Medication Sig   acetaminophen (TYLENOL) 325 MG tablet Take 650 mg by mouth every 6 (six) hours as needed.   aspirin EC 81 MG tablet Take 81 mg by mouth daily.   atorvastatin  (LIPITOR) 40 MG tablet Take 1 tablet by mouth daily.   carvedilol (COREG) 6.25 MG tablet Take 1 tablet by mouth 2 (two) times daily.    Cholecalciferol (VITAMIN D3) 50000 units CAPS Take 50,000 Units by mouth See admin instructions. Every other week.   cloNIDine (CATAPRES - DOSED IN MG/24 HR) 0.2 mg/24hr patch Place 1 patch (0.2 mg total) onto the skin once a week.   DULoxetine (CYMBALTA) 30 MG capsule Take 1 capsule by mouth daily.    folic acid (FOLVITE) 1 MG tablet Take 1 tablet (1 mg total) by mouth daily.   Multiple Vitamins-Minerals (ICAPS MV) TABS Take 2 tablets by mouth at bedtime.    Polysaccharide Iron Complex (POLY-IRON 150 PO) Take 1 tablet by mouth 2 (two) times daily.   thiamine 100 MG tablet Take 1 tablet (100 mg total) by mouth daily.     Allergies:   Demerol [meperidine], Novocain [procaine], and Pork-derived products   Social History   Socioeconomic History   Marital status: Widowed    Spouse name: Not on file   Number of children: 3   Years of education: 34   Highest education level: Not on file  Occupational History   Occupation: Retired   Tobacco Use   Smoking status: Former    Types: Cigarettes    Quit date: 05/27/1953    Years since quitting: 67.6   Smokeless tobacco: Never  Substance  and Sexual Activity   Alcohol use: Not Currently    Alcohol/week: 21.0 standard drinks    Types: 21 Glasses of wine per week    Comment: 3 glasses per day   Drug use: Not Currently    Comment: has abused prescription drugs at times   Sexual activity: Not on file  Other Topics Concern   Not on file  Social History Narrative   Resides at [facility] @ [place] [phone]   1-2 cups coffee per day   Right handed but now uses left due to previous arm fracture that makes it difficult, so uses left most of time   Social Determinants of Health   Financial Resource Strain: Not on file  Food Insecurity: Not on file  Transportation Needs: Not on file   Physical Activity: Not on file  Stress: Not on file  Social Connections: Not on file     Family History: [name] family history includes Alcohol abuse in an other family member; Heart disease in her brother; Heart failure in her mother. She was adopted.  ROS:   Please see the history of present illness.    Some difficulty with speech.  Says she is no longer drinking.  Some difficulty with her memory.  Right arm and shoulder are limited s/p injury in 2021 all other systems reviewed and are negative.  EKGs/Labs/Other Studies Reviewed:    The following studies were reviewed today: 2D Doppler echocardiogram 12/09/2019: IMPRESSIONS     1. Technically difficult study. Left ventricular ejection fraction, by  estimation, is 55 to 60%. The left ventricle has normal function. The left  ventricle has no regional wall motion abnormalities. There is mild left  ventricular hypertrophy. Left  ventricular diastolic parameters are indeterminate.   2. Right ventricular systolic function is normal. The right ventricular  size is normal. Tricuspid regurgitation signal is inadequate for assessing  PA pressure.   3. The mitral valve is normal in structure. Trivial mitral valve  regurgitation.   4. The aortic valve was not well visualized. Aortic valve regurgitation  is not visualized. No aortic stenosis is present.   5. The inferior vena cava is dilated in size with <50% respiratory  variability, suggesting right atrial pressure of 15 mmHg.   EKG:  EKG sinus rhythm with nonspecific T wave abnormality.  Recent Labs: 02/16/2020: Hemoglobin 12.7; Platelets 167  Recent Lipid Panel    Component Value Date/Time   CHOL 132 12/10/2019 0409   TRIG 86 12/10/2019 0409   HDL 38 (L) 12/10/2019 0409   CHOLHDL 3.5 12/10/2019 0409   VLDL 17 12/10/2019 0409   LDLCALC 77 12/10/2019 0409    Physical Exam:    VS:  BP 122/68   Pulse 79   Ht '5\' 3"'$  (1.6 m)   Wt 122 lb (55.3 kg)   SpO2 96%   BMI  21.61 kg/m     Wt Readings from Last 3 Encounters:  01/11/21 122 lb (55.3 kg)  08/15/20 124 lb (56.2 kg)  02/16/20 129 lb (58.5 kg)     GEN: Appears stated age.  Not overweight. No acute distress HEENT: Normal NECK: No JVD. LYMPHATICS: No lymphadenopathy CARDIAC: No murmur. RRR no gallop, or edema. VASCULAR: Normal Pulses. No bruits. RESPIRATORY:  Clear to auscultation without rales, wheezing or rhonchi  ABDOMEN: Soft, non-tender, non-distended, No pulsatile mass, MUSCULOSKELETAL: No deformity  SKIN: Warm and dry NEUROLOGIC:  Alert and oriented x 3 PSYCHIATRIC:  Normal affect   ASSESSMENT:  1. Chronic diastolic heart failure ([place])   2. Essential hypertension   3. History of stroke   4. SAH (subarachnoid hemorrhage) ([facility])    PLAN:    In order of problems listed above:  No evidence of volume overload or shortness of breath.  Early on had systolic heart failure but most recent echo demonstrates that LV function is still intact and recovery was due to ACE inhibitor therapy and beta-blocker therapy years ago.  She also stopped drinking for a while although I note the July 2021 admission was associated with alcohol withdrawal. Blood pressures well controlled on current therapy. Felt to be cardioembolic.  No obvious source found.  Not on anticoagulation because of intracranial hemorrhage. Likely trauma related when she fell out of bed leading to her hospital stay in July 2021.  She is doing well from cardiac standpoint.  There is no specific imaging or testing that needs to be done.  Her follow-up can be as needed with cardiology however she insists upon having a yearly appointment.  This will occur with me or an APP in 1 year to follow-up on stable recovered LV systolic dysfunction.    Medication Adjustments/Labs and Tests Ordered: Current medicines are reviewed at length with the patient today.  Concerns regarding medicines are outlined above.  Orders Placed This Encounter   Procedures   EKG 12-Lead    No orders of the defined types were placed in this encounter.   Patient Instructions  Medication Instructions:  Your physician recommends that you continue on your current medications as directed. Please refer to the Current Medication list given to you today.  *If you need a refill on your cardiac medications before your next appointment, please call your pharmacy*   Lab Work: None If you have labs (blood work) drawn today and your tests are completely normal, you will receive your results only by: [place] (if you have MyChart) OR A paper copy in the mail If you have any lab test that is abnormal or we need to change your treatment, we will call you to review the results.   Testing/Procedures: None   Follow-Up: At [facility], you and your health needs are our priority.  As part of our continuing mission to provide you with exceptional heart care, we have created designated Provider Care Teams.  These Care Teams include your primary Cardiologist (physician) and Advanced Practice Providers (APPs -  Physician Assistants and Nurse Practitioners) who all work together to provide you with the care you need, when you need it.  We recommend signing up for the patient portal called "MyChart".  Sign up information is provided on this After Visit Summary.  MyChart is used to connect with patients for Virtual Visits (Telemedicine).  Patients are able to view lab/test results, encounter notes, upcoming appointments, etc.  Non-urgent messages can be sent to your provider as well.   To learn more about what you can do with MyChart, go to [email or web address].    Your next appointment:   1 year(s)  The format for your next appointment:   In Person  Provider:   You may see Sinclair Grooms, MD or one of the following Advanced Practice Providers on your designated Care Team:   Cecilie Kicks, NP   Other Instructions     Signed, Sinclair Grooms, MD  01/11/2021 2:07 PM    [place]

## 2021-01-11 ENCOUNTER — Ambulatory Visit (INDEPENDENT_AMBULATORY_CARE_PROVIDER_SITE_OTHER): Payer: Medicare Other | Admitting: Interventional Cardiology

## 2021-01-11 ENCOUNTER — Other Ambulatory Visit: Payer: Self-pay

## 2021-01-11 ENCOUNTER — Encounter: Payer: Self-pay | Admitting: Interventional Cardiology

## 2021-01-11 VITALS — BP 122/68 | HR 79 | Ht 63.0 in | Wt 122.0 lb

## 2021-01-11 DIAGNOSIS — I5032 Chronic diastolic (congestive) heart failure: Secondary | ICD-10-CM | POA: Diagnosis not present

## 2021-01-11 DIAGNOSIS — I10 Essential (primary) hypertension: Secondary | ICD-10-CM

## 2021-01-11 DIAGNOSIS — I639 Cerebral infarction, unspecified: Secondary | ICD-10-CM | POA: Diagnosis not present

## 2021-01-11 DIAGNOSIS — I609 Nontraumatic subarachnoid hemorrhage, unspecified: Secondary | ICD-10-CM | POA: Diagnosis not present

## 2021-01-11 DIAGNOSIS — Z8673 Personal history of transient ischemic attack (TIA), and cerebral infarction without residual deficits: Secondary | ICD-10-CM | POA: Diagnosis not present

## 2021-01-11 NOTE — Patient Instructions (Signed)

## 2022-02-08 IMAGING — CT CT ANGIO NECK
2 of 7 series · 8 of 33 positions shown · IV contrast (omnipaque)
Comparison: Brain MRI earlier today.

CLINICAL DATA: [AGE] female with neurologic deficit and
ischemia in the left MCA territory and right medial frontal lobe on
MRI today. Recent fall.

EXAM:
CT ANGIOGRAPHY HEAD AND NECK
TECHNIQUE: Multidetector CT imaging of the head and neck was performed using
the standard protocol during bolus administration of intravenous
contrast. Multiplanar CT image reconstructions and MIPs were
obtained to evaluate the vascular anatomy. Carotid stenosis
measurements (when applicable) are obtained utilizing NASCET
criteria, using the distal internal carotid diameter as the
denominator.
CONTRAST:  75mL OMNIPAQUE IOHEXOL 350 MG/ML SOLN

[Series 5: cta neck/head (person_name) · axial · 0.62mm/px · z∈[-215,-97]mm · 2 of 178 slices shown]
[im 60/178  soft-tissue]
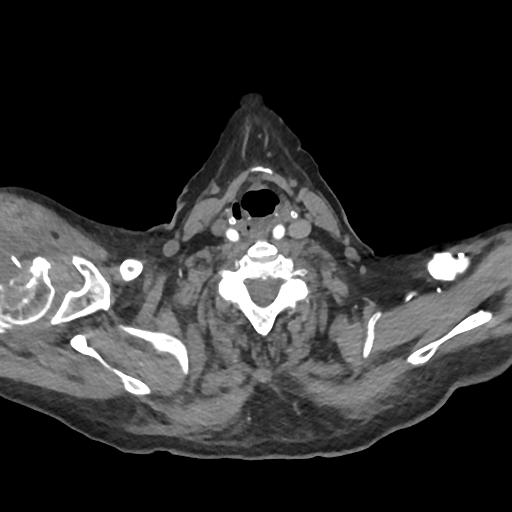
[im 119/178  soft-tissue]
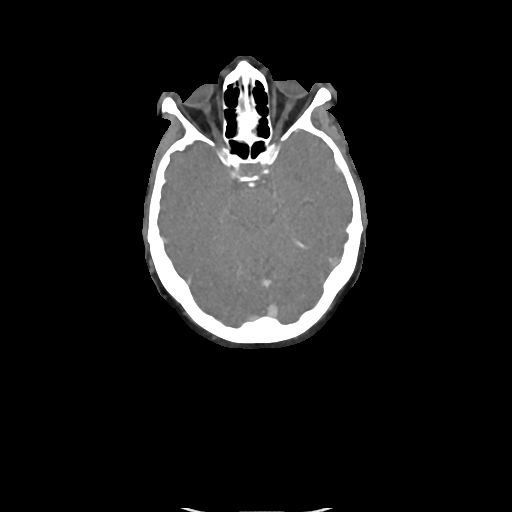

[Series 7: ax thins · axial · 0.39mm/px · z∈[-283,-33]mm · 6 of 352 slices shown]
[im 51/352  soft-tissue]
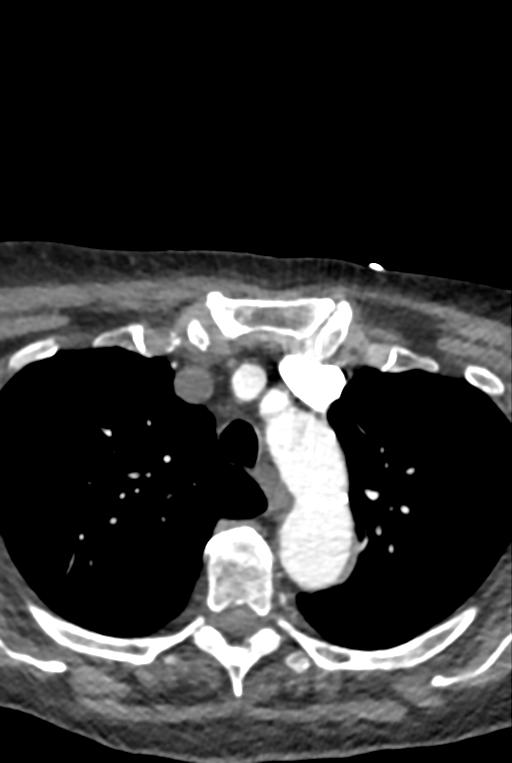
[im 101/352  bone]
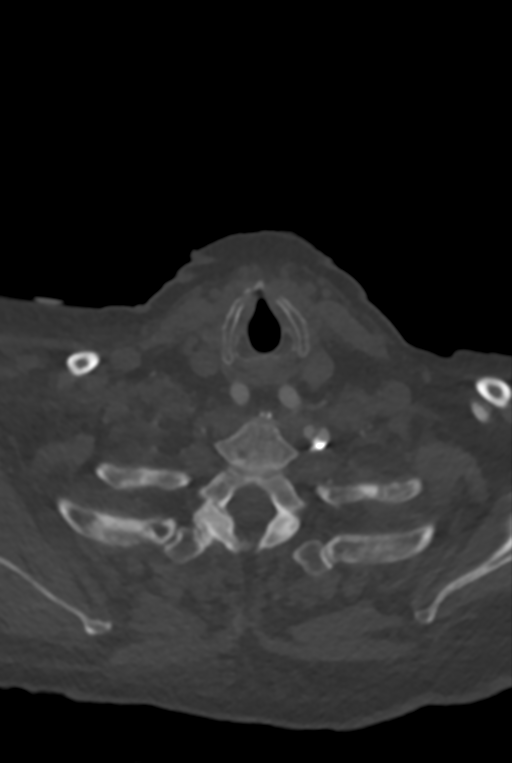
[im 151/352  soft-tissue]
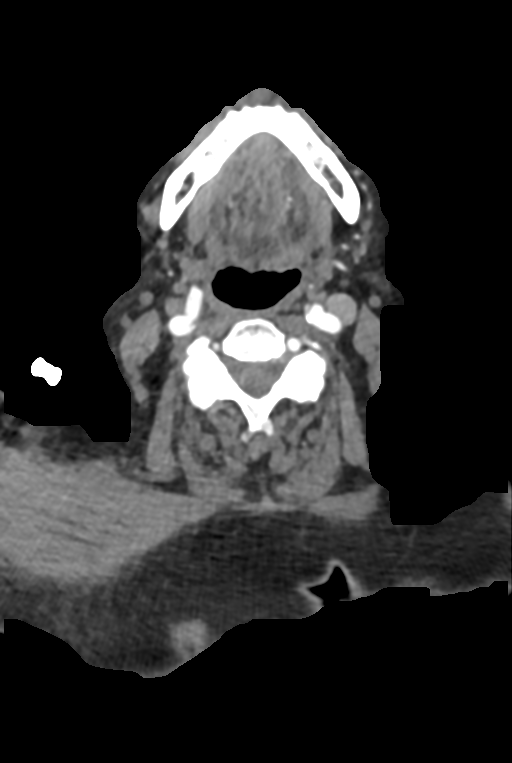
[im 201/352  bone]
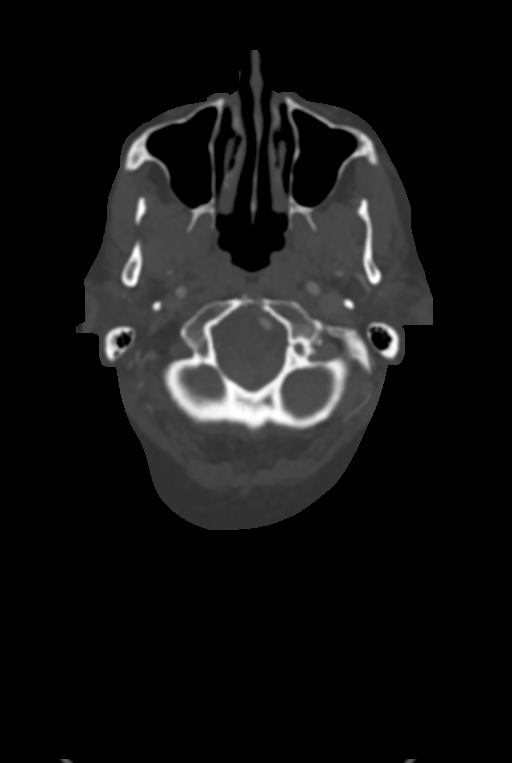
[im 251/352  soft-tissue]
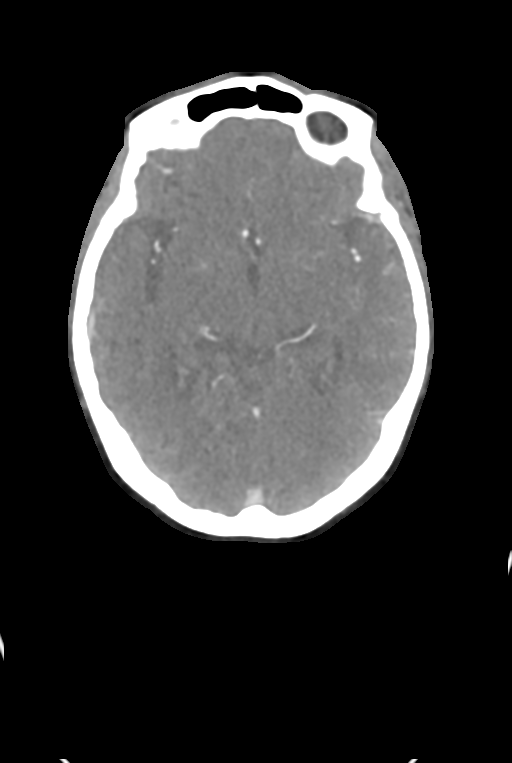
[im 301/352  bone]
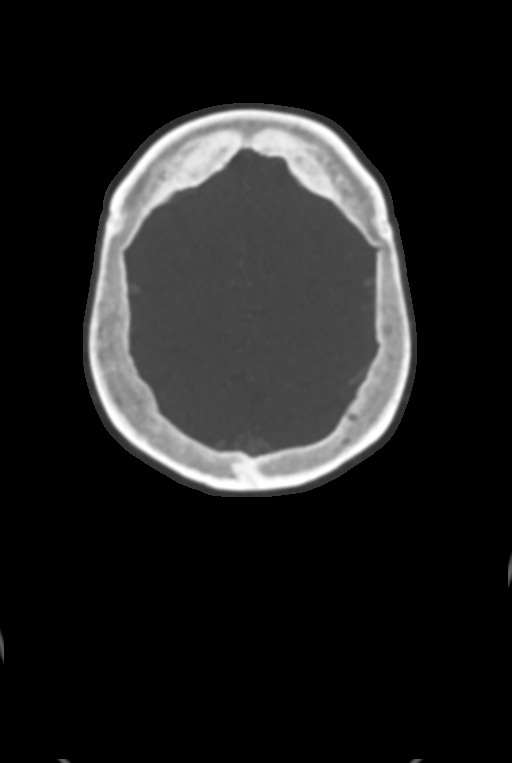

[8 of 33 positions shown; findings below may reference images not displayed]

FINDINGS: CTA NECK

Skeleton: Proximal right humerus fracture is comminuted and appears
distracted on series 5, image 123. Surrounding soft tissue
swelling/hematoma.

No other acute osseous abnormality identified. Chronic right upper
rib fractures. Hyperostosis of the calvarium. Cervical facet
arthropathy, although mild for age.

Upper chest: Partially visible central pulmonary artery enlargement.
Trace layering pleural effusions. Negative upper lung parenchyma. No
superior mediastinal lymphadenopathy.

Other neck: No acute finding identified. Submandibular gland
atrophy.

Aortic arch: 3 vessel arch configuration. Calcified aortic
atherosclerosis.

Right carotid system: No brachiocephalic artery plaque or stenosis.
Tortuous proximal right CCA without plaque or stenosis. Mild
calcified plaque at the right ICA origin without stenosis.

Left carotid system: Minimal left CCA plaque. Tortuous left CCA
without stenosis. Minimal calcified plaque at the bifurcation
primarily affects the left ECA. Negative left ICA to the skull base.

Vertebral arteries:
Calcified plaque at the right subclavian artery origin without
stenosis. Normal right vertebral artery origin. But non dominant and
diminutive right vertebral artery although patent to the skull base
without stenosis.

Minimal plaque in the proximal left subclavian artery without
stenosis. Dominant left vertebral artery origin is normal. Tortuous
left V1 segment. Widely patent left vertebral to the skull base
without plaque.

CTA HEAD

Posterior circulation: The left vertebral artery supplies the
basilar and the right vertebral terminates in PICA. Normal left PICA
origin. Patent left vertebral and basilar artery with mild
irregularity but no stenosis. Patent SCA and PCA origins. Tortuous
left P1 segment. Bilateral PCA branches are within normal limits.

Anterior circulation: Both ICA siphons are patent. On the left there
is mild calcified plaque without stenosis. And there is an
infundibulum suspected at what appears to be a tortuous left
posterior communicating artery (series 9, image 108).

On the right there is mild siphon calcified plaque without stenosis.
And a small right posterior communicating artery is also present.

Patent carotid termini. Patent MCA and ACA origins. There is mild to
moderate irregularity and stenosis of the right ACA A1, the left is
not obviously dominant. Anterior communicating artery and bilateral
ACA branches are within normal limits. Right MCA M1 segment and
bifurcation are patent without stenosis. Right MCA branches are
within normal limits.

Left MCA M1 segment and bifurcation are patent without stenosis. No
left MCA branch occlusion identified. Left MCA branches are within
normal limits.

Venous sinuses: Asymmetric left MCA venous and/or leptomeningeal
enhancement, likely post ischemic. The major dural venous sinuses
appear to be patent although contrast timing is early at the skull
base.

Anatomic variants: Dominant left vertebral artery supplies the
basilar. Right vertebral terminates in PICA.

Review of the MIP images confirms the above findings
IMPRESSION: 1. Negative for large vessel occlusion. No Left MCA branch occlusion
identified. Post ischemic/luxury perfusion enhancement suspected in
the Left MCA territory.

2. Mild for age atherosclerosis in the head and neck. However, there
is mild to moderate Right ACA A1 segment stenosis.

3. Re-demonstrated comminuted and distracted proximal right humerus
fracture with surrounding soft tissue swelling/hematoma.

4. Trace layering pleural effusions.

5. Aortic Atherosclerosis (8XY50-DSG.G).

## 2022-02-08 IMAGING — CT CT ANGIO HEAD
2 of 7 series · 8 of 33 positions shown · IV contrast (APPLIED)
Comparison: Brain MRI earlier today.

CLINICAL DATA: [AGE] female with neurologic deficit and
ischemia in the left MCA territory and right medial frontal lobe on
MRI today. Recent fall.

EXAM:
CT ANGIOGRAPHY HEAD AND NECK
TECHNIQUE: Multidetector CT imaging of the head and neck was performed using
the standard protocol during bolus administration of intravenous
contrast. Multiplanar CT image reconstructions and MIPs were
obtained to evaluate the vascular anatomy. Carotid stenosis
measurements (when applicable) are obtained utilizing NASCET
criteria, using the distal internal carotid diameter as the
denominator.
CONTRAST:  75mL OMNIPAQUE IOHEXOL 350 MG/ML SOLN

[Series 5: cta neck/head (person_name) · axial · 0.62mm/px · z∈[-215,-97]mm · 2 of 178 slices shown]
[im 60/178  soft-tissue]
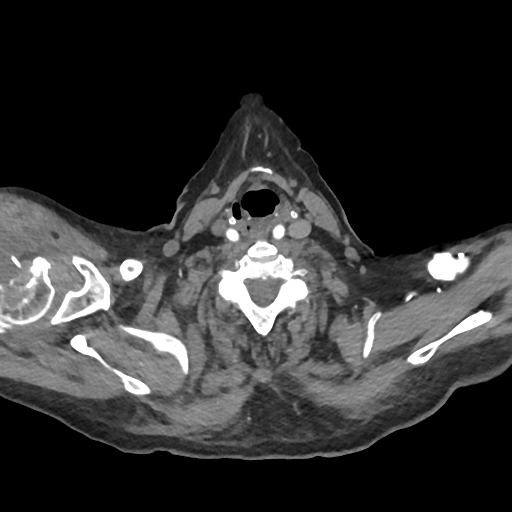
[im 119/178  soft-tissue]
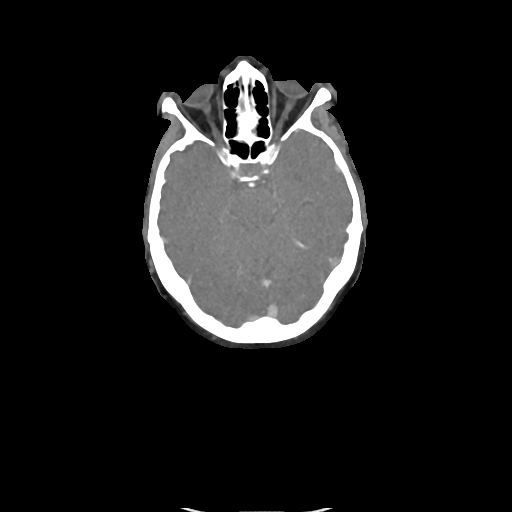

[Series 7: ax thins · axial · 0.39mm/px · z∈[-283,-33]mm · 6 of 352 slices shown]
[im 51/352  soft-tissue]
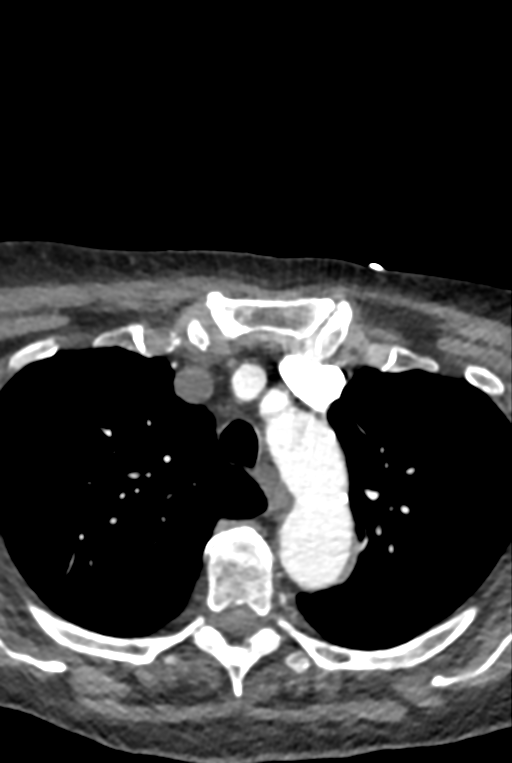
[im 101/352  bone]
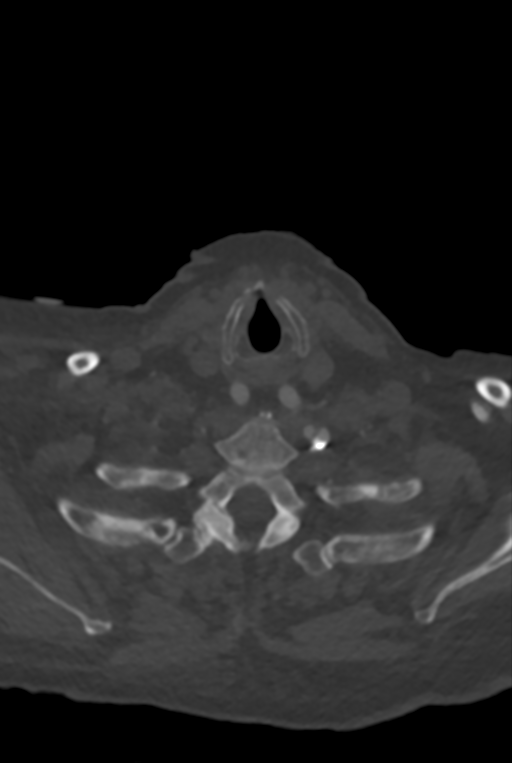
[im 151/352  soft-tissue]
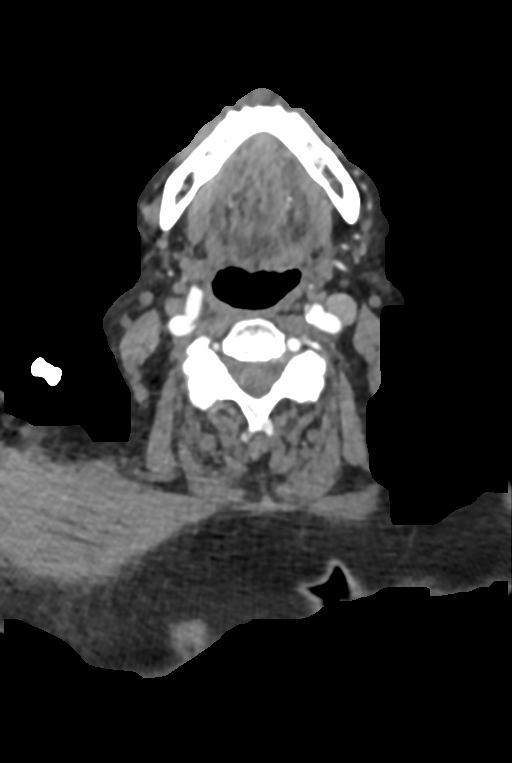
[im 201/352  bone]
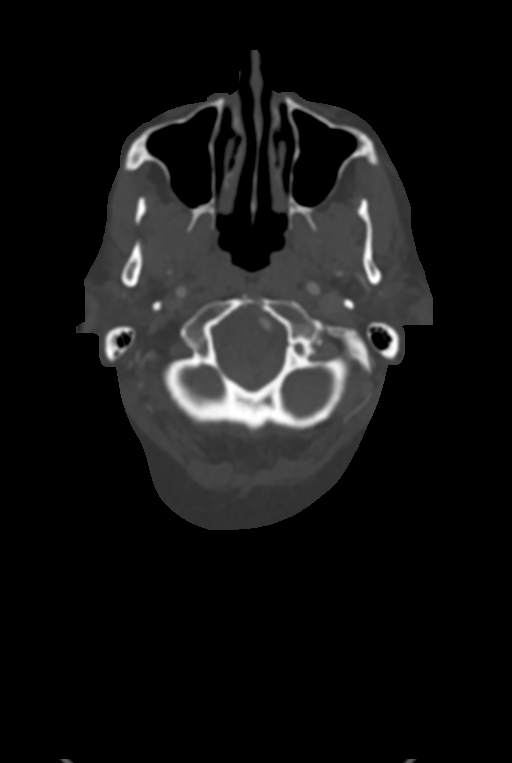
[im 251/352  soft-tissue]
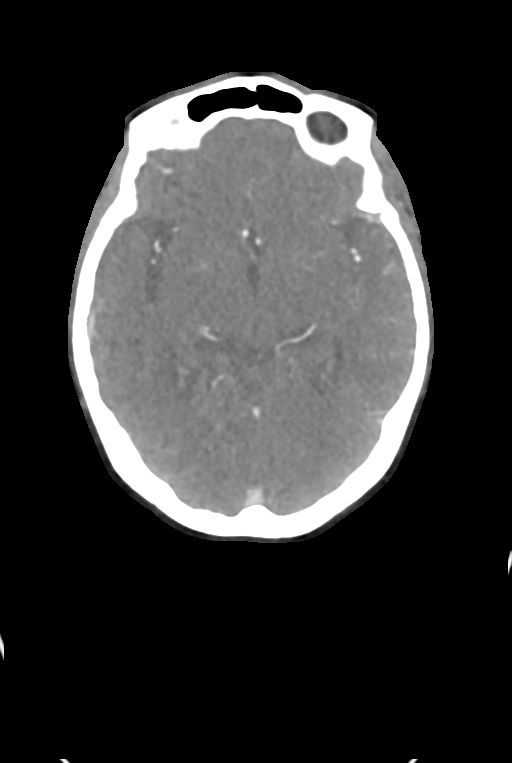
[im 301/352  bone]
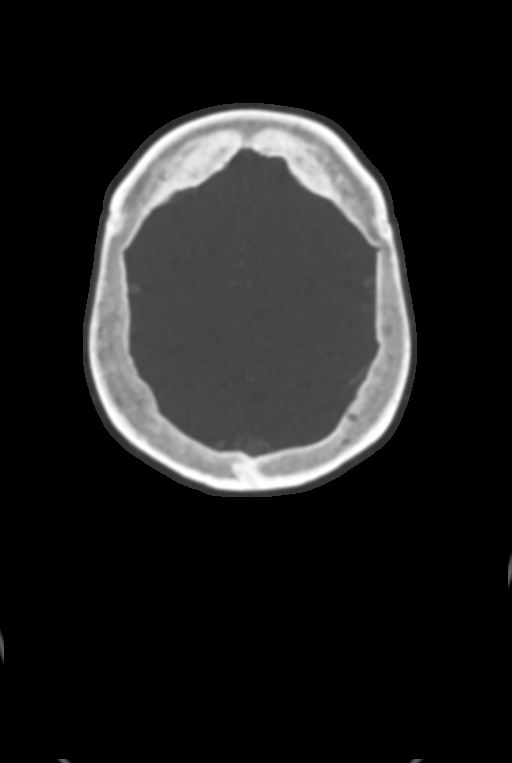

[8 of 33 positions shown; findings below may reference images not displayed]

FINDINGS: CTA NECK

Skeleton: Proximal right humerus fracture is comminuted and appears
distracted on series 5, image 123. Surrounding soft tissue
swelling/hematoma.

No other acute osseous abnormality identified. Chronic right upper
rib fractures. Hyperostosis of the calvarium. Cervical facet
arthropathy, although mild for age.

Upper chest: Partially visible central pulmonary artery enlargement.
Trace layering pleural effusions. Negative upper lung parenchyma. No
superior mediastinal lymphadenopathy.

Other neck: No acute finding identified. Submandibular gland
atrophy.

Aortic arch: 3 vessel arch configuration. Calcified aortic
atherosclerosis.

Right carotid system: No brachiocephalic artery plaque or stenosis.
Tortuous proximal right CCA without plaque or stenosis. Mild
calcified plaque at the right ICA origin without stenosis.

Left carotid system: Minimal left CCA plaque. Tortuous left CCA
without stenosis. Minimal calcified plaque at the bifurcation
primarily affects the left ECA. Negative left ICA to the skull base.

Vertebral arteries:
Calcified plaque at the right subclavian artery origin without
stenosis. Normal right vertebral artery origin. But non dominant and
diminutive right vertebral artery although patent to the skull base
without stenosis.

Minimal plaque in the proximal left subclavian artery without
stenosis. Dominant left vertebral artery origin is normal. Tortuous
left V1 segment. Widely patent left vertebral to the skull base
without plaque.

CTA HEAD

Posterior circulation: The left vertebral artery supplies the
basilar and the right vertebral terminates in PICA. Normal left PICA
origin. Patent left vertebral and basilar artery with mild
irregularity but no stenosis. Patent SCA and PCA origins. Tortuous
left P1 segment. Bilateral PCA branches are within normal limits.

Anterior circulation: Both ICA siphons are patent. On the left there
is mild calcified plaque without stenosis. And there is an
infundibulum suspected at what appears to be a tortuous left
posterior communicating artery (series 9, image 108).

On the right there is mild siphon calcified plaque without stenosis.
And a small right posterior communicating artery is also present.

Patent carotid termini. Patent MCA and ACA origins. There is mild to
moderate irregularity and stenosis of the right ACA A1, the left is
not obviously dominant. Anterior communicating artery and bilateral
ACA branches are within normal limits. Right MCA M1 segment and
bifurcation are patent without stenosis. Right MCA branches are
within normal limits.

Left MCA M1 segment and bifurcation are patent without stenosis. No
left MCA branch occlusion identified. Left MCA branches are within
normal limits.

Venous sinuses: Asymmetric left MCA venous and/or leptomeningeal
enhancement, likely post ischemic. The major dural venous sinuses
appear to be patent although contrast timing is early at the skull
base.

Anatomic variants: Dominant left vertebral artery supplies the
basilar. Right vertebral terminates in PICA.

Review of the MIP images confirms the above findings
IMPRESSION: 1. Negative for large vessel occlusion. No Left MCA branch occlusion
identified. Post ischemic/luxury perfusion enhancement suspected in
the Left MCA territory.

2. Mild for age atherosclerosis in the head and neck. However, there
is mild to moderate Right ACA A1 segment stenosis.

3. Re-demonstrated comminuted and distracted proximal right humerus
fracture with surrounding soft tissue swelling/hematoma.

4. Trace layering pleural effusions.

5. Aortic Atherosclerosis (8XY50-DSG.G).
